# Patient Record
Sex: Female | Born: 1960 | Race: White | Hispanic: No | Marital: Married | Smoking: Never smoker
Health system: Southern US, Community
[De-identification: ages and names within clinical notes are randomized; demographics above are authoritative.]

## PROBLEM LIST (undated history)

## (undated) DIAGNOSIS — E119 Type 2 diabetes mellitus without complications: Secondary | ICD-10-CM

## (undated) DIAGNOSIS — F32A Depression, unspecified: Secondary | ICD-10-CM

## (undated) DIAGNOSIS — G2581 Restless legs syndrome: Secondary | ICD-10-CM

## (undated) DIAGNOSIS — F419 Anxiety disorder, unspecified: Secondary | ICD-10-CM

## (undated) DIAGNOSIS — E559 Vitamin D deficiency, unspecified: Secondary | ICD-10-CM

## (undated) DIAGNOSIS — E7800 Pure hypercholesterolemia, unspecified: Secondary | ICD-10-CM

## (undated) DIAGNOSIS — M722 Plantar fascial fibromatosis: Secondary | ICD-10-CM

## (undated) DIAGNOSIS — M1990 Unspecified osteoarthritis, unspecified site: Secondary | ICD-10-CM

## (undated) DIAGNOSIS — I872 Venous insufficiency (chronic) (peripheral): Secondary | ICD-10-CM

## (undated) DIAGNOSIS — Z9889 Other specified postprocedural states: Secondary | ICD-10-CM

## (undated) DIAGNOSIS — Z87442 Personal history of urinary calculi: Secondary | ICD-10-CM

## (undated) DIAGNOSIS — R112 Nausea with vomiting, unspecified: Secondary | ICD-10-CM

## (undated) HISTORY — DX: Pure hypercholesterolemia, unspecified: E78.00

## (undated) HISTORY — PX: AUGMENTATION MAMMAPLASTY: SUR837

## (undated) HISTORY — PX: PARTIAL HYSTERECTOMY: SHX80

## (undated) HISTORY — PX: PLACEMENT OF BREAST IMPLANTS: SHX6334

## (undated) HISTORY — PX: CYSTOSCOPY: SUR368

## (undated) HISTORY — PX: KIDNEY STONE SURGERY: SHX686

## (undated) HISTORY — DX: Plantar fascial fibromatosis: M72.2

## (undated) HISTORY — PX: NECK SURGERY: SHX720

## (undated) HISTORY — DX: Type 2 diabetes mellitus without complications: E11.9

## (undated) HISTORY — PX: LITHOTRIPSY: SUR834

---

## 1999-07-27 ENCOUNTER — Emergency Department (HOSPITAL_COMMUNITY): Admission: EM | Admit: 1999-07-27 | Discharge: 1999-07-27 | Payer: Self-pay | Admitting: Emergency Medicine

## 2004-09-24 ENCOUNTER — Ambulatory Visit: Payer: Self-pay | Admitting: Unknown Physician Specialty

## 2005-08-20 ENCOUNTER — Ambulatory Visit: Payer: Self-pay | Admitting: Specialist

## 2005-10-09 ENCOUNTER — Ambulatory Visit (HOSPITAL_COMMUNITY): Admission: RE | Admit: 2005-10-09 | Discharge: 2005-10-10 | Payer: Self-pay | Admitting: Neurosurgery

## 2005-10-28 ENCOUNTER — Encounter: Admission: RE | Admit: 2005-10-28 | Discharge: 2005-10-28 | Payer: Self-pay | Admitting: Neurosurgery

## 2005-12-25 ENCOUNTER — Ambulatory Visit: Payer: Self-pay | Admitting: Specialist

## 2006-02-03 ENCOUNTER — Ambulatory Visit: Payer: Self-pay | Admitting: Urology

## 2006-04-13 ENCOUNTER — Ambulatory Visit: Payer: Self-pay | Admitting: Urology

## 2006-05-22 ENCOUNTER — Emergency Department: Payer: Self-pay | Admitting: Emergency Medicine

## 2006-06-08 ENCOUNTER — Ambulatory Visit: Payer: Self-pay | Admitting: Neurosurgery

## 2006-07-29 ENCOUNTER — Ambulatory Visit: Payer: Self-pay | Admitting: Anesthesiology

## 2006-08-04 ENCOUNTER — Ambulatory Visit: Payer: Self-pay | Admitting: Unknown Physician Specialty

## 2006-08-04 ENCOUNTER — Ambulatory Visit: Payer: Self-pay | Admitting: Anesthesiology

## 2006-09-02 ENCOUNTER — Ambulatory Visit: Payer: Self-pay | Admitting: Anesthesiology

## 2006-09-22 ENCOUNTER — Ambulatory Visit: Payer: Self-pay | Admitting: Anesthesiology

## 2006-10-27 HISTORY — PX: ESOPHAGOGASTRIC FUNDOPLICATION: SHX405

## 2007-01-14 ENCOUNTER — Ambulatory Visit: Payer: Self-pay

## 2007-08-13 ENCOUNTER — Ambulatory Visit: Payer: Self-pay | Admitting: Family Medicine

## 2008-11-07 ENCOUNTER — Ambulatory Visit: Payer: Self-pay | Admitting: Obstetrics & Gynecology

## 2009-09-26 ENCOUNTER — Ambulatory Visit: Payer: Self-pay | Admitting: Urology

## 2009-10-02 ENCOUNTER — Ambulatory Visit: Payer: Self-pay | Admitting: Urology

## 2009-10-05 ENCOUNTER — Ambulatory Visit: Payer: Self-pay | Admitting: Urology

## 2009-10-18 ENCOUNTER — Ambulatory Visit: Payer: Self-pay | Admitting: Urology

## 2009-11-02 ENCOUNTER — Ambulatory Visit: Payer: Self-pay | Admitting: Urology

## 2009-11-08 ENCOUNTER — Ambulatory Visit: Payer: Self-pay | Admitting: Family Medicine

## 2009-11-08 ENCOUNTER — Ambulatory Visit: Payer: Self-pay | Admitting: Urology

## 2009-11-09 ENCOUNTER — Ambulatory Visit: Payer: Self-pay | Admitting: Urology

## 2009-11-13 ENCOUNTER — Ambulatory Visit: Payer: Self-pay | Admitting: Urology

## 2010-01-05 ENCOUNTER — Ambulatory Visit: Payer: Self-pay | Admitting: Specialist

## 2010-06-06 ENCOUNTER — Ambulatory Visit: Payer: Self-pay | Admitting: Family Medicine

## 2010-07-30 ENCOUNTER — Ambulatory Visit: Payer: Self-pay | Admitting: Family Medicine

## 2010-09-13 ENCOUNTER — Ambulatory Visit: Payer: Self-pay | Admitting: Specialist

## 2010-09-26 ENCOUNTER — Ambulatory Visit: Payer: Self-pay | Admitting: Specialist

## 2011-03-05 ENCOUNTER — Ambulatory Visit: Payer: Self-pay | Admitting: Family Medicine

## 2012-03-23 ENCOUNTER — Ambulatory Visit: Payer: Self-pay | Admitting: Family Medicine

## 2012-03-24 ENCOUNTER — Ambulatory Visit: Payer: Self-pay | Admitting: Family Medicine

## 2012-03-27 ENCOUNTER — Ambulatory Visit: Payer: Self-pay | Admitting: Family Medicine

## 2013-03-29 ENCOUNTER — Ambulatory Visit: Payer: Self-pay | Admitting: Family Medicine

## 2013-05-25 DIAGNOSIS — I10 Essential (primary) hypertension: Secondary | ICD-10-CM

## 2013-05-25 DIAGNOSIS — M722 Plantar fascial fibromatosis: Secondary | ICD-10-CM

## 2013-05-25 HISTORY — DX: Plantar fascial fibromatosis: M72.2

## 2013-05-25 HISTORY — DX: Essential (primary) hypertension: I10

## 2013-07-16 ENCOUNTER — Encounter: Payer: Self-pay | Admitting: *Deleted

## 2013-07-16 DIAGNOSIS — M722 Plantar fascial fibromatosis: Secondary | ICD-10-CM | POA: Insufficient documentation

## 2013-07-27 ENCOUNTER — Encounter: Payer: Self-pay | Admitting: Podiatry

## 2013-07-27 ENCOUNTER — Ambulatory Visit (INDEPENDENT_AMBULATORY_CARE_PROVIDER_SITE_OTHER): Payer: Managed Care, Other (non HMO) | Admitting: Podiatry

## 2013-07-27 VITALS — BP 101/64 | HR 75 | Temp 97.7°F | Resp 16 | Ht 66.0 in | Wt 195.5 lb

## 2013-07-27 DIAGNOSIS — M722 Plantar fascial fibromatosis: Secondary | ICD-10-CM

## 2013-07-27 MED ORDER — DICLOFENAC SODIUM 75 MG PO TBEC: 75.0000 mg | DELAYED_RELEASE_TABLET | Freq: Two times a day (BID) | ORAL | Status: DC

## 2013-07-27 NOTE — Patient Instructions (Addendum)
Plantar Fasciitis (Heel Spur Syndrome) with Rehab The plantar fascia is a fibrous, ligament-like, soft-tissue structure that spans the bottom of the foot. Plantar fasciitis is a condition that causes pain in the foot due to inflammation of the tissue. SYMPTOMS   Pain and tenderness on the underneath side of the foot.  Pain that worsens with standing or walking. CAUSES  Plantar fasciitis is caused by irritation and injury to the plantar fascia on the underneath side of the foot. Common mechanisms of injury include:  Direct trauma to bottom of the foot.  Damage to a small nerve that runs under the foot where the main fascia attaches to the heel bone.  Stress placed on the plantar fascia due to bone spurs. RISK INCREASES WITH:   Activities that place stress on the plantar fascia (running, jumping, pivoting, or cutting).  Poor strength and flexibility.  Improperly fitted shoes.  Tight calf muscles.  Flat feet.  Failure to warm-up properly before activity.  Obesity. PREVENTION  Warm up and stretch properly before activity.  Allow for adequate recovery between workouts.  Maintain physical fitness:  Strength, flexibility, and endurance.  Cardiovascular fitness.  Maintain a health body weight.  Avoid stress on the plantar fascia.  Wear properly fitted shoes, including arch supports for individuals who have flat feet. PROGNOSIS  If treated properly, then the symptoms of plantar fasciitis usually resolve without surgery. However, occasionally surgery is necessary. RELATED COMPLICATIONS   Recurrent symptoms that may result in a chronic condition.  Problems of the lower back that are caused by compensating for the injury, such as limping.  Pain or weakness of the foot during push-off following surgery.  Chronic inflammation, scarring, and partial or complete fascia tear, occurring more often from repeated injections. TREATMENT  Treatment initially involves the use of  ice and medication to help reduce pain and inflammation. The use of strengthening and stretching exercises may help reduce pain with activity, especially stretches of the Achilles tendon. These exercises may be performed at home or with a therapist. Your caregiver may recommend that you use heel cups of arch supports to help reduce stress on the plantar fascia. Occasionally, corticosteroid injections are given to reduce inflammation. If symptoms persist for greater than 6 months despite non-surgical (conservative), then surgery may be recommended.  MEDICATION   If pain medication is necessary, then nonsteroidal anti-inflammatory medications, such as aspirin and ibuprofen, or other minor pain relievers, such as acetaminophen, are often recommended.  Do not take pain medication within 7 days before surgery.  Prescription pain relievers may be given if deemed necessary by your caregiver. Use only as directed and only as much as you need.  Corticosteroid injections may be given by your caregiver. These injections should be reserved for the most serious cases, because they may only be given a certain number of times. HEAT AND COLD  Cold treatment (icing) relieves pain and reduces inflammation. Cold treatment should be applied for 10 to 15 minutes every 2 to 3 hours for inflammation and pain and immediately after any activity that aggravates your symptoms. Use ice packs or massage the area with a piece of ice (ice massage).  Heat treatment may be used prior to performing the stretching and strengthening activities prescribed by your caregiver, physical therapist, or athletic trainer. Use a heat pack or soak the injury in warm water. SEEK IMMEDIATE MEDICAL CARE IF:  Treatment seems to offer no benefit, or the condition worsens.  Any medications produce adverse side effects. EXERCISES RANGE   OF MOTION (ROM) AND STRETCHING EXERCISES - Plantar Fasciitis (Heel Spur Syndrome) These exercises may help you  when beginning to rehabilitate your injury. Your symptoms may resolve with or without further involvement from your physician, physical therapist or athletic trainer. While completing these exercises, remember:   Restoring tissue flexibility helps normal motion to return to the joints. This allows healthier, less painful movement and activity.  An effective stretch should be held for at least 30 seconds.  A stretch should never be painful. You should only feel a gentle lengthening or release in the stretched tissue. RANGE OF MOTION - Toe Extension, Flexion  Sit with your right / left leg crossed over your opposite knee.  Grasp your toes and gently pull them back toward the top of your foot. You should feel a stretch on the bottom of your toes and/or foot.  Hold this stretch for __________ seconds.  Now, gently pull your toes toward the bottom of your foot. You should feel a stretch on the top of your toes and or foot.  Hold this stretch for __________ seconds. Repeat __________ times. Complete this stretch __________ times per day.  RANGE OF MOTION - Ankle Dorsiflexion, Active Assisted  Remove shoes and sit on a chair that is preferably not on a carpeted surface.  Place right / left foot under knee. Extend your opposite leg for support.  Keeping your heel down, slide your right / left foot back toward the chair until you feel a stretch at your ankle or calf. If you do not feel a stretch, slide your bottom forward to the edge of the chair, while still keeping your heel down.  Hold this stretch for __________ seconds. Repeat __________ times. Complete this stretch __________ times per day.  STRETCH  Gastroc, Standing  Place hands on wall.  Extend right / left leg, keeping the front knee somewhat bent.  Slightly point your toes inward on your back foot.  Keeping your right / left heel on the floor and your knee straight, shift your weight toward the wall, not allowing your back to  arch.  You should feel a gentle stretch in the right / left calf. Hold this position for __________ seconds. Repeat __________ times. Complete this stretch __________ times per day. STRETCH  Soleus, Standing  Place hands on wall.  Extend right / left leg, keeping the other knee somewhat bent.  Slightly point your toes inward on your back foot.  Keep your right / left heel on the floor, bend your back knee, and slightly shift your weight over the back leg so that you feel a gentle stretch deep in your back calf.  Hold this position for __________ seconds. Repeat __________ times. Complete this stretch __________ times per day. STRETCH  Gastrocsoleus, Standing  Note: This exercise can place a lot of stress on your foot and ankle. Please complete this exercise only if specifically instructed by your caregiver.   Place the ball of your right / left foot on a step, keeping your other foot firmly on the same step.  Hold on to the wall or a rail for balance.  Slowly lift your other foot, allowing your body weight to press your heel down over the edge of the step.  You should feel a stretch in your right / left calf.  Hold this position for __________ seconds.  Repeat this exercise with a slight bend in your right / left knee. Repeat __________ times. Complete this stretch __________ times per day.    STRENGTHENING EXERCISES - Plantar Fasciitis (Heel Spur Syndrome)  These exercises may help you when beginning to rehabilitate your injury. They may resolve your symptoms with or without further involvement from your physician, physical therapist or athletic trainer. While completing these exercises, remember:   Muscles can gain both the endurance and the strength needed for everyday activities through controlled exercises.  Complete these exercises as instructed by your physician, physical therapist or athletic trainer. Progress the resistance and repetitions only as guided. STRENGTH - Towel  Curls  Sit in a chair positioned on a non-carpeted surface.  Place your foot on a towel, keeping your heel on the floor.  Pull the towel toward your heel by only curling your toes. Keep your heel on the floor.  If instructed by your physician, physical therapist or athletic trainer, add ____________________ at the end of the towel. Repeat __________ times. Complete this exercise __________ times per day. STRENGTH - Ankle Inversion  Secure one end of a rubber exercise band/tubing to a fixed object (table, pole). Loop the other end around your foot just before your toes.  Place your fists between your knees. This will focus your strengthening at your ankle.  Slowly, pull your big toe up and in, making sure the band/tubing is positioned to resist the entire motion.  Hold this position for __________ seconds.  Have your muscles resist the band/tubing as it slowly pulls your foot back to the starting position. Repeat __________ times. Complete this exercises __________ times per day.  Document Released: 10/13/2005 Document Revised: 01/05/2012 Document Reviewed: 01/25/2009 [employer] Patient Information 2014 [employer]. Plantar Fasciitis Plantar fasciitis is a common condition that causes foot pain. It is soreness (inflammation) of the band of tough fibrous tissue on the bottom of the foot that runs from the heel bone (calcaneus) to the ball of the foot. The cause of this soreness may be from excessive standing, poor fitting shoes, running on hard surfaces, being overweight, having an abnormal walk, or overuse (this is common in runners) of the painful foot or feet. It is also common in aerobic exercise dancers and ballet dancers. SYMPTOMS  Most people with plantar fasciitis complain of:  Severe pain in the morning on the bottom of their foot especially when taking the first steps out of bed. This pain recedes after a few minutes of walking.  Severe pain is experienced also during walking  following a long period of inactivity.  Pain is worse when walking barefoot or up stairs DIAGNOSIS   Your caregiver will diagnose this condition by examining and feeling your foot.  Special tests such as X-rays of your foot, are usually not needed. PREVENTION   Consult a sports medicine professional before beginning a new exercise program.  Walking programs offer a good workout. With walking there is a lower chance of overuse injuries common to runners. There is less impact and less jarring of the joints.  Begin all new exercise programs slowly. If problems or pain develop, decrease the amount of time or distance until you are at a comfortable level.  Wear good shoes and replace them regularly.  Stretch your foot and the heel cords at the back of the ankle (Achilles tendon) both before and after exercise.  Run or exercise on even surfaces that are not hard. For example, asphalt is better than pavement.  Do not run barefoot on hard surfaces.  If using a treadmill, vary the incline.  Do not continue to workout if you have foot or joint   problems. Seek professional help if they do not improve. HOME CARE INSTRUCTIONS   Avoid activities that cause you pain until you recover.  Use ice or cold packs on the problem or painful areas after working out.  Only take over-the-counter or prescription medicines for pain, discomfort, or fever as directed by your caregiver.  Soft shoe inserts or athletic shoes with air or gel sole cushions may be helpful.  If problems continue or become more severe, consult a sports medicine caregiver or your own health care provider. Cortisone is a potent anti-inflammatory medication that may be injected into the painful area. You can discuss this treatment with your caregiver. MAKE SURE YOU:   Understand these instructions.  Will watch your condition.  Will get help right away if you are not doing well or get worse. Document Released: 07/08/2001 Document  Revised: 01/05/2012 Document Reviewed: 09/06/2008 [employer] Patient Information 2014 [employer].  

## 2013-07-27 NOTE — Progress Notes (Signed)
Andrea Mcdonald presents today for followup of her plantar fasciitis bilaterally. She states that she is wearing her night splint she is icing she is doing everything she can. If it is not getting better at this point. Physical exam today reveals palpable pulses no calf pain. She has pain on palpation of the medial calcaneal tubercles.  Assessment: Plantar fasciitis bilaterally  Plan: Bilateral plantar fascial injections today. Continue all conservative therapies and will followup with her in one month.

## 2013-09-01 ENCOUNTER — Ambulatory Visit (INDEPENDENT_AMBULATORY_CARE_PROVIDER_SITE_OTHER): Payer: Managed Care, Other (non HMO) | Admitting: Podiatry

## 2013-09-01 ENCOUNTER — Encounter: Payer: Self-pay | Admitting: Podiatry

## 2013-09-01 VITALS — BP 91/54 | HR 77 | Resp 16 | Ht 66.0 in | Wt 190.0 lb

## 2013-09-01 DIAGNOSIS — M722 Plantar fascial fibromatosis: Secondary | ICD-10-CM

## 2013-09-01 NOTE — Progress Notes (Signed)
Andrea Mcdonald presents today complaining of bilateral painful heels. She states that they're discuss sore with her standing. She says that her shoes are wearing out too quickly. She states that the last medicine that week she took developed an ulcer.  Objective: Pulses remain palpable bilateral. Pain in limb secondary to plantar fasciitis bilateral. Palpation medial calcaneal tubercles bilateral.  Assessment: Plantar fasciitis chronic in nature bilateral.  Plan: Injected bilateral heels today discussed the necessity of new shoes and discuss the need for orthotics.

## 2013-09-12 ENCOUNTER — Telehealth: Payer: Self-pay | Admitting: *Deleted

## 2013-09-12 NOTE — Telephone Encounter (Signed)
PT CALLED WANTING TO CXL SURGERY [facility] ON 12.12.14 WITH DR. HYATT. SAID SHE IS GOING TO WAIT AND WILL KEEP HER 12.3.14 APPT. CALLED SURGERY CENTER AND CXL WITH CYNTHIA.

## 2013-09-28 ENCOUNTER — Encounter: Payer: Self-pay | Admitting: Podiatry

## 2013-09-28 ENCOUNTER — Ambulatory Visit (INDEPENDENT_AMBULATORY_CARE_PROVIDER_SITE_OTHER): Payer: Managed Care, Other (non HMO) | Admitting: Podiatry

## 2013-09-28 VITALS — BP 101/77 | HR 77 | Resp 16

## 2013-09-28 DIAGNOSIS — M722 Plantar fascial fibromatosis: Secondary | ICD-10-CM

## 2013-09-28 NOTE — Progress Notes (Signed)
Andrea Mcdonald presents today for followup of her plantar fasciitis. She states that she was very much considering surgical correction. However she decided to take our advice and purchased a new pair of tennis shoes with inserts. She states that this point she feels approximately 80% better. She would like to consider purchasing orthotics in January.  Objective: Pulses are strongly palpable bilateral no calf pain. She has no pain on palpation medial continued tubercles bilateral.  Assessment: Well-healing plantar fasciitis bilateral.  Plan: Continue use of all conservative therapies at this point and I will followup with her in January.

## 2013-09-29 ENCOUNTER — Ambulatory Visit: Payer: Managed Care, Other (non HMO) | Admitting: Podiatry

## 2013-11-14 ENCOUNTER — Ambulatory Visit: Payer: Managed Care, Other (non HMO) | Admitting: Podiatry

## 2013-12-28 ENCOUNTER — Ambulatory Visit (INDEPENDENT_AMBULATORY_CARE_PROVIDER_SITE_OTHER): Payer: Managed Care, Other (non HMO) | Admitting: Podiatry

## 2013-12-28 DIAGNOSIS — M722 Plantar fascial fibromatosis: Secondary | ICD-10-CM

## 2013-12-28 NOTE — Progress Notes (Signed)
She presented today for orthotic scan. We will notify her once her orthotics come in. Her current diagnosis his plantar fasciitis.

## 2014-01-19 ENCOUNTER — Ambulatory Visit (INDEPENDENT_AMBULATORY_CARE_PROVIDER_SITE_OTHER): Payer: Managed Care, Other (non HMO) | Admitting: Podiatry

## 2014-01-19 DIAGNOSIS — M722 Plantar fascial fibromatosis: Secondary | ICD-10-CM

## 2014-01-19 NOTE — Patient Instructions (Signed)

## 2014-01-19 NOTE — Progress Notes (Signed)
She for orthotics she was given both instructions for the use and care of them I will followup with her one month for evaluation.

## 2014-02-16 ENCOUNTER — Ambulatory Visit: Payer: Managed Care, Other (non HMO) | Admitting: Podiatry

## 2014-03-06 ENCOUNTER — Encounter: Payer: Self-pay | Admitting: Podiatry

## 2014-03-06 ENCOUNTER — Ambulatory Visit (INDEPENDENT_AMBULATORY_CARE_PROVIDER_SITE_OTHER): Payer: Managed Care, Other (non HMO) | Admitting: Podiatry

## 2014-03-06 VITALS — BP 112/82 | HR 88 | Resp 16

## 2014-03-06 DIAGNOSIS — M722 Plantar fascial fibromatosis: Secondary | ICD-10-CM

## 2014-03-06 NOTE — Progress Notes (Signed)
She presents today wearing her orthotics. She never followed up with me for her orthotic evaluation. She states that her heels are hurting terribly she can hardly walk.  Objective: Vital signs are stable she is alert and oriented x3. She has pain on palpation medial continued tubercles bilateral. Pulses are palpable bilateral and no calf pain bilateral.  Assessment: Plantar fasciitis bilateral.  Plan:

## 2014-05-24 ENCOUNTER — Encounter: Payer: Self-pay | Admitting: Podiatry

## 2014-05-24 ENCOUNTER — Ambulatory Visit (INDEPENDENT_AMBULATORY_CARE_PROVIDER_SITE_OTHER): Payer: Managed Care, Other (non HMO) | Admitting: Podiatry

## 2014-05-24 DIAGNOSIS — M7750 Other enthesopathy of unspecified foot: Secondary | ICD-10-CM

## 2014-05-24 DIAGNOSIS — M779 Enthesopathy, unspecified: Principal | ICD-10-CM

## 2014-05-24 DIAGNOSIS — M778 Other enthesopathies, not elsewhere classified: Secondary | ICD-10-CM

## 2014-05-25 NOTE — Progress Notes (Signed)
He presents today complaining of a painful hallux interphalangeal joint secondary to new shoe gear at work. She states that her blood sugars under control a 6.8 hemoglobin A1c. She's concerned about ulceration.  Objective: Pulses are strongly palpable. Neurologic sensorium is intact. Mild erythema and mild tenderness on range of motion at the IP joint particularly on the medial aspect of the IP joint hallux bilaterally. Radiographic evaluation demonstrates normal osseous architecture. She does have some mild extensors at the IP joint.  Assessment: Hallux interphalangeal with capsulitis hallux bilateral.  Plan: Injected the area today with dexamethasone 2 mg to the point of maximal tenderness. I will followup with her on an as-needed basis.

## 2014-09-19 ENCOUNTER — Ambulatory Visit: Payer: Self-pay | Admitting: Family Medicine

## 2014-10-02 ENCOUNTER — Ambulatory Visit (INDEPENDENT_AMBULATORY_CARE_PROVIDER_SITE_OTHER): Payer: Managed Care, Other (non HMO) | Admitting: Podiatry

## 2014-10-02 ENCOUNTER — Ambulatory Visit: Payer: Self-pay

## 2014-10-02 ENCOUNTER — Ambulatory Visit (INDEPENDENT_AMBULATORY_CARE_PROVIDER_SITE_OTHER): Payer: Managed Care, Other (non HMO)

## 2014-10-02 VITALS — BP 91/60 | HR 98 | Resp 16

## 2014-10-02 DIAGNOSIS — M722 Plantar fascial fibromatosis: Secondary | ICD-10-CM

## 2014-10-02 NOTE — Progress Notes (Signed)
She presents today stating that both of her heels are bothering her again.  Objective evaluation reveals 5 signs are stable she's alert and oriented 3 she has pain on palpation medially located tubercles bilateral heels.  Assessment: Pain in limb secondary to plantar fasciitis bilateral.  Plan: Injected the bilateral heels today with Kenalog and local anesthetic. Continue all conservative therapies.

## 2014-10-29 ENCOUNTER — Emergency Department: Payer: Self-pay | Admitting: Emergency Medicine

## 2014-10-29 LAB — URINALYSIS, COMPLETE
Bacteria: NONE SEEN
Bilirubin,UR: NEGATIVE
GLUCOSE, UR: NEGATIVE mg/dL (ref 0–75)
KETONE: NEGATIVE
LEUKOCYTE ESTERASE: NEGATIVE
NITRITE: NEGATIVE
Ph: 5 (ref 4.5–8.0)
RBC,UR: 547 /HPF (ref 0–5)
SPECIFIC GRAVITY: 1.024 (ref 1.003–1.030)
Squamous Epithelial: 1

## 2014-10-29 LAB — CBC
HCT: 40.9 % (ref 35.0–47.0)
HGB: 13.7 g/dL (ref 12.0–16.0)
MCH: 29.4 pg (ref 26.0–34.0)
MCHC: 33.4 g/dL (ref 32.0–36.0)
MCV: 88 fL (ref 80–100)
PLATELETS: 212 10*3/uL (ref 150–440)
RBC: 4.65 10*6/uL (ref 3.80–5.20)
RDW: 13.6 % (ref 11.5–14.5)
WBC: 10.8 10*3/uL (ref 3.6–11.0)

## 2014-10-29 LAB — COMPREHENSIVE METABOLIC PANEL
ANION GAP: 5 — AB (ref 7–16)
AST: 20 U/L (ref 15–37)
Albumin: 4.1 g/dL (ref 3.4–5.0)
Alkaline Phosphatase: 72 U/L
BUN: 16 mg/dL (ref 7–18)
Bilirubin,Total: 0.7 mg/dL (ref 0.2–1.0)
CALCIUM: 9.7 mg/dL (ref 8.5–10.1)
CO2: 32 mmol/L (ref 21–32)
Chloride: 102 mmol/L (ref 98–107)
Creatinine: 0.98 mg/dL (ref 0.60–1.30)
GLUCOSE: 169 mg/dL — AB (ref 65–99)
OSMOLALITY: 283 (ref 275–301)
POTASSIUM: 4.5 mmol/L (ref 3.5–5.1)
SGPT (ALT): 27 U/L
SODIUM: 139 mmol/L (ref 136–145)
Total Protein: 7.6 g/dL (ref 6.4–8.2)

## 2015-04-09 ENCOUNTER — Encounter: Payer: Self-pay | Admitting: Podiatry

## 2015-04-09 ENCOUNTER — Ambulatory Visit (INDEPENDENT_AMBULATORY_CARE_PROVIDER_SITE_OTHER): Payer: Managed Care, Other (non HMO) | Admitting: Podiatry

## 2015-04-09 VITALS — BP 100/52 | HR 86 | Resp 16

## 2015-04-09 DIAGNOSIS — M722 Plantar fascial fibromatosis: Secondary | ICD-10-CM

## 2015-04-09 MED ORDER — CELECOXIB 100 MG PO CAPS: 100.0000 mg | ORAL_CAPSULE | Freq: Two times a day (BID) | ORAL | Status: DC

## 2015-04-09 NOTE — Progress Notes (Signed)
She presents today with chief complaint of bilateral heel pain.  Objective: Vital signs are stable she is alert and oriented 3. She has recurrence of plantar fasciitis bilateral. Right greater than left. She has pain on palpation lateral aspect of the right heel medial aspect of the left heel.  Assessment: Plantar fascitis lateral right medial left.  Plan: Injected the bilateral heels today with Kenalog and local anesthesia to. Started her on Celebrex 100 mg 1 by mouth twice a day she will continue to ice where her plantar fascial braces and night splint. Discussed the needs for orthotics and will follow up with her in 1 month. She will bring her old orthotics with her next visit

## 2015-05-14 ENCOUNTER — Ambulatory Visit: Payer: Managed Care, Other (non HMO) | Admitting: Podiatry

## 2015-06-07 ENCOUNTER — Encounter (INDEPENDENT_AMBULATORY_CARE_PROVIDER_SITE_OTHER): Payer: Self-pay

## 2015-06-07 ENCOUNTER — Encounter: Payer: Self-pay | Admitting: Family Medicine

## 2015-06-07 ENCOUNTER — Ambulatory Visit (INDEPENDENT_AMBULATORY_CARE_PROVIDER_SITE_OTHER): Payer: Managed Care, Other (non HMO) | Admitting: Family Medicine

## 2015-06-07 VITALS — BP 102/58 | HR 105 | Temp 98.6°F | Resp 16 | Ht 65.0 in | Wt 190.2 lb

## 2015-06-07 DIAGNOSIS — IMO0002 Reserved for concepts with insufficient information to code with codable children: Secondary | ICD-10-CM

## 2015-06-07 DIAGNOSIS — I10 Essential (primary) hypertension: Secondary | ICD-10-CM | POA: Diagnosis not present

## 2015-06-07 DIAGNOSIS — F329 Major depressive disorder, single episode, unspecified: Secondary | ICD-10-CM

## 2015-06-07 DIAGNOSIS — E1165 Type 2 diabetes mellitus with hyperglycemia: Secondary | ICD-10-CM

## 2015-06-07 DIAGNOSIS — E785 Hyperlipidemia, unspecified: Secondary | ICD-10-CM

## 2015-06-07 DIAGNOSIS — F418 Other specified anxiety disorders: Secondary | ICD-10-CM | POA: Insufficient documentation

## 2015-06-07 DIAGNOSIS — E1169 Type 2 diabetes mellitus with other specified complication: Secondary | ICD-10-CM | POA: Insufficient documentation

## 2015-06-07 DIAGNOSIS — Z23 Encounter for immunization: Secondary | ICD-10-CM | POA: Diagnosis not present

## 2015-06-07 DIAGNOSIS — F32A Depression, unspecified: Secondary | ICD-10-CM

## 2015-06-07 DIAGNOSIS — F419 Anxiety disorder, unspecified: Secondary | ICD-10-CM | POA: Insufficient documentation

## 2015-06-07 LAB — GLUCOSE, POCT (MANUAL RESULT ENTRY): POC Glucose: 250 mg/dl — AB (ref 70–99)

## 2015-06-07 LAB — POCT GLYCOSYLATED HEMOGLOBIN (HGB A1C): HEMOGLOBIN A1C: 7.3

## 2015-06-07 NOTE — Progress Notes (Signed)
Name: Andrea Mcdonald   MRN: [id number]    DOBWell-controlle: August 31, 1961   Date:06/07/2015       Progress Note  Subjective  Chief Complaint  Chief Complaint  Patient presents with  . Diabetes    pt here for 3 month f/u DM,HTN,lipids  . Hypertension  . Hyperlipidemia    Diabetes She presents for her follow-up diabetic visit. She has type 2 diabetes mellitus. Her disease course has been improving. Hypoglycemia symptoms include nervousness/anxiousness. Pertinent negatives for hypoglycemia include no dizziness, headaches, seizures or tremors. Associated symptoms include chest pain and fatigue. Pertinent negatives for diabetes include no blurred vision, no weakness and no weight loss. Symptoms are stable. There are no diabetic complications. Risk factors for coronary artery disease include diabetes mellitus, dyslipidemia, hypertension, obesity, sedentary lifestyle and post-menopausal. Current diabetic treatment includes oral agent (dual therapy). She is compliant with treatment most of the time. Her weight is increasing steadily. She is following a diabetic diet. Her overall blood glucose range is 140-180 mg/dl. An ACE inhibitor/angiotensin II receptor blocker is being taken.  Hypertension This is a chronic problem. The current episode started more than 1 year ago. The problem has been rapidly improving since onset. The problem is controlled. Associated symptoms include anxiety and chest pain. Pertinent negatives include no blurred vision, headaches, neck pain, orthopnea, palpitations or shortness of breath. There are no associated agents to hypertension. Risk factors for coronary artery disease include diabetes mellitus, dyslipidemia, obesity, post-menopausal state, sedentary lifestyle and stress. Past treatments include ACE inhibitors and diuretics. The current treatment provides moderate improvement. There are no compliance problems.   Hyperlipidemia This is a chronic problem. The current episode  started more than 1 year ago. The problem is controlled. Recent lipid tests were reviewed and are normal. Exacerbating diseases include obesity. Factors aggravating her hyperlipidemia include fatty foods. Associated symptoms include chest pain. Pertinent negatives include no focal weakness, myalgias or shortness of breath. Current antihyperlipidemic treatment includes statins. The current treatment provides moderate improvement of lipids. There are no compliance problems.   Depression        This is a chronic problem.  The current episode started more than 1 year ago.   The onset quality is gradual.   Associated symptoms include fatigue and restlessness.  Associated symptoms include does not have insomnia, no myalgias and no headaches.     The symptoms are aggravated by work stress and family issues.  Compliance with treatment is good.  Risk factors include major life event, marital problems and menopause.   Past medical history includes anxiety.       Past Medical History  Diagnosis Date  . Diabetes mellitus without complication   . Hypertension 05/25/2013  . High cholesterol   . Plantar fasciitis, bilateral 05/25/2013    Social History  Substance Use Topics  . Smoking status: Never Smoker   . Smokeless tobacco: Never Used  . Alcohol Use: Yes     Comment: drink occ     Current outpatient prescriptions:  .  glipiZIDE (GLUCOTROL XL) 5 MG 24 hr tablet, Take 5 mg by mouth daily., Disp: , Rfl:  .  lisinopril (PRINIVIL,ZESTRIL) 10 MG tablet, Take 10 mg by mouth daily., Disp: , Rfl:  .  metFORMIN (GLUCOPHAGE) 1000 MG tablet, Take 1,000 mg by mouth 2 (two) times daily with a meal., Disp: , Rfl:  .  rosuvastatin (CRESTOR) 20 MG tablet, Take 20 mg by mouth daily., Disp: , Rfl:  .  sertraline (ZOLOFT) 100 MG  tablet, , Disp: , Rfl:  .  triamterene-hydrochlorothiazide (DYAZIDE) 37.5-25 MG per capsule, Take 1 capsule by mouth every morning., Disp: , Rfl:   Allergies  Allergen Reactions  .  Codeine Itching    Review of Systems  Constitutional: Positive for fatigue. Negative for fever, chills and weight loss.  HENT: Negative for congestion, hearing loss, sore throat and tinnitus.   Eyes: Negative for blurred vision, double vision and redness.  Respiratory: Negative for cough, hemoptysis and shortness of breath.   Cardiovascular: Positive for chest pain. Negative for palpitations, orthopnea, claudication and leg swelling.  Gastrointestinal: Negative for heartburn, nausea, vomiting, diarrhea, constipation and blood in stool.  Genitourinary: Negative for dysuria, urgency, frequency and hematuria.  Musculoskeletal: Negative for myalgias, back pain, joint pain, falls and neck pain.  Skin: Negative for itching.  Neurological: Negative for dizziness, tingling, tremors, focal weakness, seizures, loss of consciousness, weakness and headaches.  Endo/Heme/Allergies: Does not bruise/bleed easily.  Psychiatric/Behavioral: Positive for depression. Negative for substance abuse. The patient is nervous/anxious. The patient does not have insomnia.      Objective  Filed Vitals:   06/07/15 1526  BP: 102/58  Pulse: 105  Temp: 98.6 F (37 C)  Resp: 16  Height: 5\' 5"  (1.651 m)  Weight: 190 lb 3 oz (86.268 kg)  SpO2: 96%     Physical Exam  Constitutional: She is oriented to person, place, and time and well-developed, well-nourished, and in no distress.  Modestly obese  HENT:  Head: Normocephalic.  Eyes: EOM are normal. Pupils are equal, round, and reactive to light.  Neck: Normal range of motion. No thyromegaly present.  Cardiovascular: Normal rate, regular rhythm and normal heart sounds.   No murmur heard. Pulmonary/Chest: Effort normal and breath sounds normal.  Abdominal: Soft. Bowel sounds are normal.  Musculoskeletal: Normal range of motion. She exhibits no edema.  Neurological: She is alert and oriented to person, place, and time. No cranial nerve deficit. Gait normal.   Skin: Skin is warm and dry. No rash noted.  Psychiatric: Memory and affect normal.      Assessment & Plan  1. Uncontrolled diabetes mellitusemphasized need for dietary compliance as well as medication compliance - POCT Glucose (CBG) - POCT HgB A1C  2. Hyperlipidemia  - Lipid Profile  3. Essential hypertension  - Comprehensive Metabolic Panel (CMET) - TSH  4. Depression   5. Flu vaccine need  - Flu Vaccine QUAD 36+ mos PF IM (Fluarix Quad PF)

## 2015-06-08 ENCOUNTER — Encounter: Payer: Self-pay | Admitting: Family Medicine

## 2015-06-08 DIAGNOSIS — Z23 Encounter for immunization: Secondary | ICD-10-CM

## 2015-06-25 ENCOUNTER — Other Ambulatory Visit: Payer: Self-pay | Admitting: Family Medicine

## 2015-06-25 ENCOUNTER — Other Ambulatory Visit: Payer: Self-pay | Admitting: Emergency Medicine

## 2015-06-25 MED ORDER — OMEPRAZOLE 20 MG PO CPDR: 20.0000 mg | DELAYED_RELEASE_CAPSULE | Freq: Every day | ORAL | Status: DC

## 2015-06-25 NOTE — Telephone Encounter (Signed)
Script sent  

## 2015-07-07 LAB — COMPREHENSIVE METABOLIC PANEL
A/G RATIO: 1.8 (ref 1.1–2.5)
ALT: 18 IU/L (ref 0–32)
AST: 14 IU/L (ref 0–40)
Albumin: 4.4 g/dL (ref 3.5–5.5)
Alkaline Phosphatase: 65 IU/L (ref 39–117)
BUN/Creatinine Ratio: 15 (ref 9–23)
BUN: 13 mg/dL (ref 6–24)
Bilirubin Total: 0.9 mg/dL (ref 0.0–1.2)
CALCIUM: 9.8 mg/dL (ref 8.7–10.2)
CO2: 26 mmol/L (ref 18–29)
Chloride: 101 mmol/L (ref 97–108)
Creatinine, Ser: 0.85 mg/dL (ref 0.57–1.00)
GFR, EST AFRICAN AMERICAN: 90 mL/min/{1.73_m2} (ref >59)
GFR, EST NON AFRICAN AMERICAN: 78 mL/min/{1.73_m2} (ref >59)
GLUCOSE: 189 mg/dL — AB (ref 65–99)
Globulin, Total: 2.4 g/dL (ref 1.5–4.5)
Potassium: 4.5 mmol/L (ref 3.5–5.2)
Sodium: 141 mmol/L (ref 134–144)
TOTAL PROTEIN: 6.8 g/dL (ref 6.0–8.5)

## 2015-07-07 LAB — LIPID PANEL
CHOL/HDL RATIO: 3 ratio (ref 0.0–4.4)
Cholesterol, Total: 154 mg/dL (ref 100–199)
HDL: 52 mg/dL (ref >39)
LDL CALC: 67 mg/dL (ref 0–99)
Triglycerides: 175 mg/dL — ABNORMAL HIGH (ref 0–149)
VLDL CHOLESTEROL CAL: 35 mg/dL (ref 5–40)

## 2015-07-07 LAB — TSH: TSH: 2.03 u[IU]/mL (ref 0.450–4.500)

## 2015-07-09 ENCOUNTER — Telehealth: Payer: Self-pay | Admitting: Family Medicine

## 2015-07-19 NOTE — Telephone Encounter (Signed)
No additional needed ?

## 2015-08-13 ENCOUNTER — Other Ambulatory Visit: Payer: Self-pay | Admitting: Family Medicine

## 2015-09-03 ENCOUNTER — Telehealth: Payer: Self-pay | Admitting: Family Medicine

## 2015-09-03 NOTE — Telephone Encounter (Signed)
Patient is going on a cruise this weekend and is requesting something for motion to be sent to [facility].

## 2015-09-04 NOTE — Telephone Encounter (Signed)
:   Transdermal scopolamine applied behind the ear 4 hours before embarking on cruise and change every 3 days. Give sufficient number for the duration of the cruise with this regimen

## 2015-09-05 MED ORDER — SCOPOLAMINE 1 MG/3DAYS TD PT72: 1.0000 | MEDICATED_PATCH | TRANSDERMAL | Status: DC

## 2015-09-05 NOTE — Telephone Encounter (Signed)
Patches have been sent to her pharmacy,[facility] on [address].

## 2015-09-05 NOTE — Telephone Encounter (Signed)
Patient informed through voicemail.

## 2015-10-01 ENCOUNTER — Other Ambulatory Visit: Payer: Self-pay | Admitting: Family Medicine

## 2015-10-01 NOTE — Telephone Encounter (Signed)
I am forwarding this encounter to the designated PCP and/or their nursing staff for further management of the tasks requested. Thank you.  

## 2015-10-09 ENCOUNTER — Ambulatory Visit: Payer: Managed Care, Other (non HMO) | Admitting: Family Medicine

## 2015-10-18 ENCOUNTER — Ambulatory Visit: Payer: Managed Care, Other (non HMO) | Admitting: Family Medicine

## 2015-10-22 ENCOUNTER — Emergency Department
Admission: EM | Admit: 2015-10-22 | Discharge: 2015-10-22 | Disposition: A | Discharge status: Home / Self Care | Payer: Managed Care, Other (non HMO) | Attending: Emergency Medicine | Admitting: Emergency Medicine

## 2015-10-22 ENCOUNTER — Encounter: Payer: Self-pay | Admitting: Emergency Medicine

## 2015-10-22 DIAGNOSIS — I10 Essential (primary) hypertension: Secondary | ICD-10-CM | POA: Diagnosis not present

## 2015-10-22 DIAGNOSIS — E1165 Type 2 diabetes mellitus with hyperglycemia: Secondary | ICD-10-CM | POA: Diagnosis present

## 2015-10-22 DIAGNOSIS — Z794 Long term (current) use of insulin: Secondary | ICD-10-CM | POA: Insufficient documentation

## 2015-10-22 DIAGNOSIS — Z7982 Long term (current) use of aspirin: Secondary | ICD-10-CM | POA: Diagnosis not present

## 2015-10-22 DIAGNOSIS — Z7984 Long term (current) use of oral hypoglycemic drugs: Secondary | ICD-10-CM | POA: Diagnosis not present

## 2015-10-22 DIAGNOSIS — R739 Hyperglycemia, unspecified: Secondary | ICD-10-CM

## 2015-10-22 DIAGNOSIS — Z79899 Other long term (current) drug therapy: Secondary | ICD-10-CM | POA: Diagnosis not present

## 2015-10-22 LAB — URINALYSIS COMPLETE WITH MICROSCOPIC (ARMC ONLY)
Bacteria, UA: NONE SEEN
Bilirubin Urine: NEGATIVE
Glucose, UA: >500 mg/dL — AB
Hgb urine dipstick: NEGATIVE
KETONES UR: NEGATIVE mg/dL
Leukocytes, UA: NEGATIVE
NITRITE: NEGATIVE
PH: 5 (ref 5.0–8.0)
PROTEIN: NEGATIVE mg/dL
RBC / HPF: NONE SEEN RBC/hpf (ref 0–5)
Specific Gravity, Urine: 1.028 (ref 1.005–1.030)

## 2015-10-22 LAB — BASIC METABOLIC PANEL
ANION GAP: 9 (ref 5–15)
BUN: 14 mg/dL (ref 6–20)
CALCIUM: 10.3 mg/dL (ref 8.9–10.3)
CO2: 29 mmol/L (ref 22–32)
Chloride: 99 mmol/L — ABNORMAL LOW (ref 101–111)
Creatinine, Ser: 0.81 mg/dL (ref 0.44–1.00)
Glucose, Bld: 330 mg/dL — ABNORMAL HIGH (ref 65–99)
Potassium: 3.8 mmol/L (ref 3.5–5.1)
Sodium: 137 mmol/L (ref 135–145)

## 2015-10-22 LAB — CBC
HCT: 41.6 % (ref 35.0–47.0)
HEMOGLOBIN: 14.2 g/dL (ref 12.0–16.0)
MCH: 29 pg (ref 26.0–34.0)
MCHC: 34.2 g/dL (ref 32.0–36.0)
MCV: 84.8 fL (ref 80.0–100.0)
Platelets: 189 10*3/uL (ref 150–440)
RBC: 4.91 MIL/uL (ref 3.80–5.20)
RDW: 13.4 % (ref 11.5–14.5)
WBC: 7.7 10*3/uL (ref 3.6–11.0)

## 2015-10-22 LAB — GLUCOSE, CAPILLARY: Glucose-Capillary: 301 mg/dL — ABNORMAL HIGH (ref 65–99)

## 2015-10-22 MED ORDER — SODIUM CHLORIDE 0.9 % IV BOLUS (SEPSIS)
1000.0000 mL | INTRAVENOUS | Status: AC
  Administered 2015-10-22: 1000 mL via INTRAVENOUS

## 2015-10-22 NOTE — ED Provider Notes (Addendum)
[facility] Emergency Department Provider Note  ____________________________________________  Time seen: Approximately 5:06 PM  I have reviewed the triage vital signs and the nursing notes.   HISTORY  Chief Complaint Hyperglycemia    HPI Andrea Mcdonald is a 54 y.o. female with a history that includes diabetes on metformin 1000 mg twice daily and glipizide 10 mg daily who presents with elevated blood sugar and associated symptoms of polyuria and some blurry vision.  She states that this is been gradual in onset over about the last week.  She has tested her blood sugar at home and it has been as high as 588.  When that happened she used an old insulin pen that she had from "a long time ago" when she used insulin.  She stated that her blood sugar dropped too low when she used it but today it is back up in the 400s.  She came to the emergency department for further evaluation.  She endorses increased frequency of urination and increased thirst as well as blurry vision and describes her symptoms as moderate.  She has no other symptoms specifically including fever/chills, chest pain, shortness of breath, abdominal pain, dysuria, nausea/vomiting.  Past Medical History  Diagnosis Date  . Diabetes mellitus without complication ([place])   . Hypertension 05/25/2013  . High cholesterol   . Plantar fasciitis, bilateral 05/25/2013    Patient Active Problem List   Diagnosis Date Noted  . Flu vaccine need 06/08/2015  . Uncontrolled diabetes mellitus ([place]) 06/07/2015  . Hyperlipidemia 06/07/2015  . Essential hypertension 06/07/2015  . Depression 06/07/2015  . Plantar fasciitis, bilateral     Past Surgical History  Procedure Laterality Date  . Partial hysterectomy    . Neck surgery      x2  . Placement of breast implants    . Lithotripsy    . Kidney stone surgery      Current Outpatient Rx  Name  Route  Sig  Dispense  Refill  . aspirin 81 MG chewable tablet    Oral   Chew 81 mg by mouth at bedtime.         Marland Kitchen glipiZIDE (GLUCOTROL XL) 10 MG 24 hr tablet   Oral   Take 10 mg by mouth at bedtime.          Marland Kitchen lisinopril (PRINIVIL,ZESTRIL) 10 MG tablet   Oral   Take 10 mg by mouth at bedtime.          . metFORMIN (GLUCOPHAGE) 1000 MG tablet   Oral   Take 1,000 mg by mouth at bedtime.          Marland Kitchen omeprazole (PRILOSEC) 20 MG capsule   Oral   Take 1 capsule (20 mg total) by mouth daily. Patient taking differently: Take 20 mg by mouth daily as needed (for indigestion/heartburn).    30 capsule   3   . rosuvastatin (CRESTOR) 20 MG tablet   Oral   Take 20 mg by mouth at bedtime.         . sertraline (ZOLOFT) 100 MG tablet   Oral   Take 100 mg by mouth at bedtime.          . triamterene-hydrochlorothiazide (MAXZIDE-25) 37.5-25 MG tablet   Oral   Take 1 tablet by mouth at bedtime.          Marland Kitchen scopolamine (TRANSDERM-SCOP, 1.5 MG,) 1 MG/3DAYS   Transdermal   Place 1 patch (1.5 mg total) onto the skin every 3 (three)  days. Patient not taking: Reported on 10/22/2015   10 patch   12     Allergies Codeine  Family History  Problem Relation Age of Onset  . Heart disease Mother   . Depression Mother   . Anxiety disorder Mother     Social History Social History  Substance Use Topics  . Smoking status: Never Smoker   . Smokeless tobacco: Never Used  . Alcohol Use: Yes     Comment: drink occ    Review of Systems Constitutional: No fever/chills Eyes: Blurry vision ENT: No sore throat. Cardiovascular: Denies chest pain. Respiratory: Denies shortness of breath. Gastrointestinal: No abdominal pain.  No nausea, no vomiting.  No diarrhea.  No constipation. Genitourinary: Negative for dysuria.  Increased urination  Musculoskeletal: Negative for back pain. Skin: Negative for rash. Neurological: Negative for headaches, focal weakness or numbness.  10-point ROS otherwise  negative.  ____________________________________________   PHYSICAL EXAM:  VITAL SIGNS: ED Triage Vitals  Enc Vitals Group     BP 10/22/15 1647 146/94 mmHg     Pulse Rate 10/22/15 1647 83     Resp 10/22/15 1647 18     Temp 10/22/15 1647 98.3 F (36.8 C)     Temp Source 10/22/15 1647 Oral     SpO2 10/22/15 1647 100 %     Weight 10/22/15 1647 186 lb (84.369 kg)     Height 10/22/15 1647 5\' 5"  (1.651 m)     Head Cir --      Peak Flow --      Pain Score --      Pain Loc --      Pain Edu? --      Excl. in [place]? --     Constitutional: Alert and oriented. Well appearing and in no acute distress. Eyes: Conjunctivae are normal. PERRL. EOMI. no papilledema on funduscopic exam Head: Atraumatic. Nose: No congestion/rhinnorhea. Mouth/Throat: Mucous membranes are moist.  Oropharynx non-erythematous. Neck: No stridor.   Cardiovascular: Normal rate, regular rhythm. Grossly normal heart sounds.  Good peripheral circulation. Respiratory: Normal respiratory effort.  No retractions. Lungs CTAB. Gastrointestinal: Soft and nontender. No distention. No abdominal bruits. No CVA tenderness. Musculoskeletal: No lower extremity tenderness nor edema.  No joint effusions. Neurologic:  Normal speech and language. No gross focal neurologic deficits are appreciated.  Skin:  Skin is warm, dry and intact. No rash noted. Psychiatric: Mood and affect are normal. Speech and behavior are normal.  ____________________________________________   LABS (all labs ordered are listed, but only abnormal results are displayed)  Labs Reviewed  BASIC METABOLIC PANEL - Abnormal; Notable for the following:    Chloride 99 (*)    Glucose, Bld 330 (*)    All other components within normal limits  URINALYSIS COMPLETEWITH MICROSCOPIC ([facility] ONLY) - Abnormal; Notable for the following:    Color, Urine YELLOW (*)    APPearance CLEAR (*)    Glucose, UA >500 (*)    Squamous Epithelial / LPF 0-5 (*)    All other components  within normal limits  GLUCOSE, CAPILLARY - Abnormal; Notable for the following:    Glucose-Capillary 301 (*)    All other components within normal limits  CBC  CBG MONITORING, ED   ____________________________________________  EKG  Not indicated ____________________________________________  RADIOLOGY   No results found.  ____________________________________________   PROCEDURES  Procedure(s) performed: None  Critical Care performed: No ____________________________________________   INITIAL IMPRESSION / ASSESSMENT AND PLAN / ED COURSE  Pertinent labs & imaging results that  were available during my care of the patient were reviewed by me and considered in my medical decision making (see chart for details).  No evidence of DKA or any other serious complication of hyperglycemia.  Patient's blood sugar was 330 and the rest of the BMP was reassuring.  I explained to her that changing chronic meds in the emergency department is not recommended and I treated her with 1 L of normal saline to further lower her hyperglycemia.  I recommended close outpatient follow-up with her PCP  ____________________________________________  FINAL CLINICAL IMPRESSION(S) / ED DIAGNOSES  Final diagnoses:  Hyperglycemia      NEW MEDICATIONS STARTED DURING THIS VISIT:  New Prescriptions   No medications on file     Hinda Kehr, MD 10/22/15 1945  Hinda Kehr, MD 10/22/15 2024

## 2015-10-22 NOTE — ED Notes (Signed)
Pt reports blood sugar was 588 on Friday and used an insulin pen she had from before (unsure of what type of insulin) and then her sugar dropped low so she stopped.  Today blood sugar 426 at home per pt. Also c/o blurry vision.  Increased urination.

## 2015-10-22 NOTE — Discharge Instructions (Signed)
As we discussed, though your blood sugar is running high, it is not dangerous at this time.  Making adjustments in the Emergency Department (ED) and possibly causing your glucose level to drop too low is more dangerous than continuing your current medications at this time until you can follow up with your clinic doctor. ° °Please continue your medications and follow up with your regular doctor as recommended in these documents.  If you develop new or worsening symptoms that concern you, please return to the Emergency Department. ° ° °Hyperglycemia °Hyperglycemia occurs when the glucose (sugar) in your blood is too high. Hyperglycemia can happen for many reasons, but it most often happens to people who do not know they have diabetes or are not managing their diabetes properly.  °CAUSES  °Whether you have diabetes or not, there are other causes of hyperglycemia. Hyperglycemia can occur when you have diabetes, but it can also occur in other situations that you might not be as aware of, such as: °Diabetes °· If you have diabetes and are having problems controlling your blood glucose, hyperglycemia could occur because of some of the following reasons: °¨ Not following your meal plan. °¨ Not taking your diabetes medications or not taking it properly. °¨ Exercising less or doing less activity than you normally do. °¨ Being sick. °Pre-diabetes °· This cannot be ignored. Before people develop Type 2 diabetes, they almost always have "pre-diabetes." This is when your blood glucose levels are higher than normal, but not yet high enough to be diagnosed as diabetes. Research has shown that some long-term damage to the body, especially the heart and circulatory system, may already be occurring during pre-diabetes. If you take action to manage your blood glucose when you have pre-diabetes, you may delay or prevent Type 2 diabetes from developing. °Stress °· If you have diabetes, you may be "diet" controlled or on oral medications  or insulin to control your diabetes. However, you may find that your blood glucose is higher than usual in the hospital whether you have diabetes or not. This is often referred to as "stress hyperglycemia." Stress can elevate your blood glucose. This happens because of hormones put out by the body during times of stress. If stress has been the cause of your high blood glucose, it can be followed regularly by your caregiver. That way he/she can make sure your hyperglycemia does not continue to get worse or progress to diabetes. °Steroids °· Steroids are medications that act on the infection fighting system (immune system) to block inflammation or infection. One side effect can be a rise in blood glucose. Most people can produce enough extra insulin to allow for this rise, but for those who cannot, steroids make blood glucose levels go even higher. It is not unusual for steroid treatments to "uncover" diabetes that is developing. It is not always possible to determine if the hyperglycemia will go away after the steroids are stopped. A special blood test called an A1c is sometimes done to determine if your blood glucose was elevated before the steroids were started. °SYMPTOMS °· Thirsty. °· Frequent urination. °· Dry mouth. °· Blurred vision. °· Tired or fatigue. °· Weakness. °· Sleepy. °· Tingling in feet or leg. °DIAGNOSIS  °Diagnosis is made by monitoring blood glucose in one or all of the following ways: °· A1c test. This is a chemical found in your blood. °· Fingerstick blood glucose monitoring. °· Laboratory results. °TREATMENT  °First, knowing the cause of the hyperglycemia is important before the   hyperglycemia can be treated. Treatment may include, but is not be limited to: °· Education. °· Change or adjustment in medications. °· Change or adjustment in meal plan. °· Treatment for an illness, infection, etc. °· More frequent blood glucose monitoring. °· Change in exercise plan. °· Decreasing or stopping  steroids. °· Lifestyle changes. °HOME CARE INSTRUCTIONS  °· Test your blood glucose as directed. °· Exercise regularly. Your caregiver will give you instructions about exercise. Pre-diabetes or diabetes which comes on with stress is helped by exercising. °· Eat wholesome, balanced meals. Eat often and at regular, fixed times. Your caregiver or nutritionist will give you a meal plan to guide your sugar intake. °· Being at an ideal weight is important. If needed, losing as little as 10 to 15 pounds may help improve blood glucose levels. °SEEK MEDICAL CARE IF:  °· You have questions about medicine, activity, or diet. °· You continue to have symptoms (problems such as increased thirst, urination, or weight gain). °SEEK IMMEDIATE MEDICAL CARE IF:  °· You are vomiting or have diarrhea. °· Your breath smells fruity. °· You are breathing faster or slower. °· You are very sleepy or incoherent. °· You have numbness, tingling, or pain in your feet or hands. °· You have chest pain. °· Your symptoms get worse even though you have been following your caregiver's orders. °· If you have any other questions or concerns. °  °This information is not intended to replace advice given to you by your health care provider. Make sure you discuss any questions you have with your health care provider. °  °Document Released: 04/08/2001 Document Revised: 01/05/2012 Document Reviewed: 06/19/2015 °Elsevier Interactive Patient Education ©2016 [employer] ° °

## 2015-10-30 ENCOUNTER — Ambulatory Visit: Payer: Managed Care, Other (non HMO) | Admitting: Family Medicine

## 2015-11-05 ENCOUNTER — Ambulatory Visit: Payer: Managed Care, Other (non HMO) | Admitting: Family Medicine

## 2015-11-06 ENCOUNTER — Telehealth: Payer: Self-pay

## 2015-11-06 NOTE — Telephone Encounter (Signed)
Patient called stating her blood sugar has been greatly elevated and her vision is very blurry. Patient has been seen at the ER and was only given fluids. She was given an appt for 11/07/15 at 10:20am.

## 2015-11-07 ENCOUNTER — Encounter: Payer: Self-pay | Admitting: Family Medicine

## 2015-11-07 ENCOUNTER — Ambulatory Visit (INDEPENDENT_AMBULATORY_CARE_PROVIDER_SITE_OTHER): Payer: Managed Care, Other (non HMO) | Admitting: Family Medicine

## 2015-11-07 VITALS — BP 122/70 | HR 91 | Temp 98.6°F | Resp 16 | Ht 65.0 in | Wt 185.6 lb

## 2015-11-07 DIAGNOSIS — I10 Essential (primary) hypertension: Secondary | ICD-10-CM

## 2015-11-07 DIAGNOSIS — E785 Hyperlipidemia, unspecified: Secondary | ICD-10-CM | POA: Diagnosis not present

## 2015-11-07 DIAGNOSIS — E1169 Type 2 diabetes mellitus with other specified complication: Secondary | ICD-10-CM | POA: Diagnosis not present

## 2015-11-07 DIAGNOSIS — E1100 Type 2 diabetes mellitus with hyperosmolarity without nonketotic hyperglycemic-hyperosmolar coma (NKHHC): Secondary | ICD-10-CM | POA: Diagnosis not present

## 2015-11-07 LAB — POCT GLYCOSYLATED HEMOGLOBIN (HGB A1C): Hemoglobin A1C: 11.4

## 2015-11-07 LAB — POCT URINALYSIS DIPSTICK
BILIRUBIN UA: NEGATIVE
Blood, UA: NEGATIVE
GLUCOSE UA: 500
Ketones, UA: NEGATIVE
LEUKOCYTES UA: NEGATIVE
NITRITE UA: NEGATIVE
Protein, UA: NEGATIVE
Spec Grav, UA: 1.02
UROBILINOGEN UA: NEGATIVE
pH, UA: 5

## 2015-11-07 LAB — GLUCOSE, POCT (MANUAL RESULT ENTRY): POC Glucose: 102 mg/dl — AB (ref 70–99)

## 2015-11-07 MED ORDER — GLUCOSE BLOOD VI STRP: ORAL_STRIP | Status: DC

## 2015-11-07 MED ORDER — ROSUVASTATIN CALCIUM 20 MG PO TABS: 20.0000 mg | ORAL_TABLET | Freq: Every day | ORAL | Status: DC

## 2015-11-07 MED ORDER — LISINOPRIL 10 MG PO TABS: 10.0000 mg | ORAL_TABLET | Freq: Every day | ORAL | Status: DC

## 2015-11-07 MED ORDER — OMEPRAZOLE 20 MG PO CPDR
20.0000 mg | DELAYED_RELEASE_CAPSULE | Freq: Every day | ORAL | Status: DC
Start: 2015-11-07 — End: 2017-03-16

## 2015-11-07 MED ORDER — SERTRALINE HCL 100 MG PO TABS: 100.0000 mg | ORAL_TABLET | Freq: Every day | ORAL | Status: DC

## 2015-11-07 MED ORDER — TRIAMTERENE-HCTZ 37.5-25 MG PO TABS: 1.0000 | ORAL_TABLET | Freq: Every day | ORAL | Status: DC

## 2015-11-07 MED ORDER — INSULIN DEGLUDEC 200 UNIT/ML ~~LOC~~ SOPN: 10.0000 [IU] | PEN_INJECTOR | Freq: Every evening | SUBCUTANEOUS | Status: DC | PRN

## 2015-11-07 NOTE — Progress Notes (Signed)
Name: Andrea Mcdonald   MRN: [id number]    DOB: 04/18/1961   Date:11/07/2015       Progress Note  Subjective  Chief Complaint  Chief Complaint  Patient presents with  . Diabetes    ER follow up BS at 588 when at ER 300, vision changes, thirsty,   . Hypertension    4 month follow up   . Hyperlipidemia    HPI  Diabetes    Patient presents for follow-up of diabetes which is present for over 5 years. Is currently on a regimen of glipizide 10 mg twice a day D and metformin 1000 mg twice a day. She was also placed on Imdur, but stopped it on her own accord. Blood sugars have been extremely high recently has been as 588 home. A few days ago she presented emergency department she was given IV fluids. Blood sugars were down under 200 with the time she left the emergency department. Blood sugar was 214 at home earlier today . Patient states moderately getting compliance with their diet and exercise. There's been no hypoglycemic episodes and there is no polyuria polydipsia polyphagia. But she is experiencing some visual variation. His average fasting glucoses been in the low around 214 at home today with a high around over 500 a week or so ago . There is no end organ disease.  Last diabetic eye exam was last year.   Last visit with dietitian was over year ago. Last microalbumin was obtained February 2016 and was 0 .   Hypertension   Patient presents for follow-up of hypertension. It has been present for over over 5 years.  Patient states that there is compliance with medical regimen which consists of  . There is no end organ disease. Cardiac risk factors include hypertension hyperlipidemia and diabetes.  Exercise regimen consist of minimal walking .  Diet consist of restricted salt .  Hyperlipidemia  Patient has a history of hyperlipidemia for over 5  years.  Current medical regimen consist of Crestor 20 mg daily at bedtime .  Compliance is good .  Diet and exercise are currently followed  intermittently .  Risk factors for cardiovascular disease include hyperlipidemia hypertension diabetes .   There have been no side effects from the medication.      Past Medical History  Diagnosis Date  . Diabetes mellitus without complication ([place])   . Hypertension 05/25/2013  . High cholesterol   . Plantar fasciitis, bilateral 05/25/2013    Social History  Substance Use Topics  . Smoking status: Never Smoker   . Smokeless tobacco: Never Used  . Alcohol Use: Yes     Comment: drink occ     Current outpatient prescriptions:  .  aspirin 81 MG chewable tablet, Chew 81 mg by mouth at bedtime., Disp: , Rfl:  .  glipiZIDE (GLUCOTROL XL) 10 MG 24 hr tablet, Take 10 mg by mouth at bedtime. , Disp: , Rfl:  .  lisinopril (PRINIVIL,ZESTRIL) 10 MG tablet, Take 1 tablet (10 mg total) by mouth at bedtime., Disp: 30 tablet, Rfl: 5 .  metFORMIN (GLUCOPHAGE) 1000 MG tablet, Take 1,000 mg by mouth at bedtime. , Disp: , Rfl:  .  omeprazole (PRILOSEC) 20 MG capsule, Take 1 capsule (20 mg total) by mouth daily., Disp: 30 capsule, Rfl: 3 .  rosuvastatin (CRESTOR) 20 MG tablet, Take 1 tablet (20 mg total) by mouth at bedtime., Disp: 30 tablet, Rfl: 5 .  scopolamine (TRANSDERM-SCOP, 1.5 MG,) 1 MG/3DAYS, Place 1 patch (  1.5 mg total) onto the skin every 3 (three) days. (Patient not taking: Reported on 10/22/2015), Disp: 10 patch, Rfl: 12 .  sertraline (ZOLOFT) 100 MG tablet, Take 1 tablet (100 mg total) by mouth at bedtime., Disp: 30 tablet, Rfl: 5 .  triamterene-hydrochlorothiazide (MAXZIDE-25) 37.5-25 MG tablet, Take 1 tablet by mouth at bedtime., Disp: 30 tablet, Rfl: 5  Allergies  Allergen Reactions  . Codeine Itching    Review of Systems  Constitutional: Negative for fever, chills and weight loss.  HENT: Negative for congestion, hearing loss, sore throat and tinnitus.   Eyes: Positive for blurred vision and double vision. Negative for redness.  Respiratory: Negative for cough, hemoptysis and  shortness of breath.   Cardiovascular: Negative for chest pain, palpitations, orthopnea, claudication and leg swelling.  Gastrointestinal: Negative for heartburn, nausea, vomiting, diarrhea, constipation and blood in stool.  Genitourinary: Negative for dysuria, urgency, frequency and hematuria.  Musculoskeletal: Negative for myalgias, back pain, joint pain, falls and neck pain.  Skin: Negative for itching.  Neurological: Negative for dizziness, tingling, tremors, focal weakness, seizures, loss of consciousness, weakness and headaches.  Endo/Heme/Allergies: Does not bruise/bleed easily.  Psychiatric/Behavioral: Negative for depression and substance abuse. The patient is not nervous/anxious and does not have insomnia.      Objective  Filed Vitals:   11/07/15 1030  BP: 122/70  Pulse: 91  Temp: 98.6 F (37 C)  TempSrc: Oral  Resp: 16  Height: 5\' 5"  (1.651 m)  Weight: 185 lb 9.6 oz (84.188 kg)  SpO2: 97%     Physical Exam  Constitutional: She is oriented to person, place, and time and well-developed, well-nourished, and in no distress.  HENT:  Head: Normocephalic.  Eyes: EOM are normal. Pupils are equal, round, and reactive to light.  Neck: Normal range of motion. No thyromegaly present.  Cardiovascular: Normal rate, regular rhythm and normal heart sounds.   No murmur heard. Pulmonary/Chest: Effort normal and breath sounds normal.  Abdominal: Soft. Bowel sounds are normal.  Musculoskeletal: Normal range of motion. She exhibits no edema.  Neurological: She is alert and oriented to person, place, and time. No cranial nerve deficit. Gait normal.  Skin: Skin is warm and dry. No rash noted.  Psychiatric: Memory and affect normal.      Assessment & Plan   1. Type 2 diabetes mellitus with other specified complication (HCC) (Control with patient having discontinue Invokana - POCT HgB A1C - POCT Glucose (CBG) - POCT urinalysis dipstick - Insulin Degludec (TRESIBA FLEXTOUCH)  200 UNIT/ML SOPN; Inject 10 Units into the skin at bedtime as needed (increase by 1 unit id FBS over 120).  Dispense: 1 pen; Refill: 5 - Fructosamine  2. Uncontrolled type 2 diabetes mellitus with hyperosmolarity without coma, without long-term current use of insulin ([place]) As above  3. Hyperlipidemia Controlled  4. Essential hypertension Controlled

## 2015-11-08 LAB — FRUCTOSAMINE: Fructosamine: 436 umol/L — ABNORMAL HIGH (ref 0–285)

## 2015-11-09 MED ORDER — INSULIN DEGLUDEC 100 UNIT/ML ~~LOC~~ SOPN: 10.0000 [IU] | PEN_INJECTOR | Freq: Every day | SUBCUTANEOUS | Status: DC

## 2015-11-12 ENCOUNTER — Other Ambulatory Visit: Payer: Self-pay | Admitting: Family Medicine

## 2015-11-12 ENCOUNTER — Ambulatory Visit: Payer: Managed Care, Other (non HMO) | Admitting: Podiatry

## 2015-12-04 ENCOUNTER — Other Ambulatory Visit: Payer: Self-pay | Admitting: Family Medicine

## 2015-12-11 ENCOUNTER — Encounter: Payer: Self-pay | Admitting: Family Medicine

## 2015-12-11 ENCOUNTER — Ambulatory Visit
Admission: RE | Admit: 2015-12-11 | Discharge: 2015-12-11 | Disposition: A | Discharge status: Home / Self Care | Payer: Managed Care, Other (non HMO) | Source: Ambulatory Visit | Attending: Family Medicine | Admitting: Family Medicine

## 2015-12-11 ENCOUNTER — Ambulatory Visit (INDEPENDENT_AMBULATORY_CARE_PROVIDER_SITE_OTHER): Payer: Managed Care, Other (non HMO) | Admitting: Family Medicine

## 2015-12-11 VITALS — BP 126/70 | HR 112 | Temp 98.9°F | Resp 16 | Ht 65.0 in | Wt 183.0 lb

## 2015-12-11 DIAGNOSIS — R1011 Right upper quadrant pain: Secondary | ICD-10-CM | POA: Insufficient documentation

## 2015-12-11 DIAGNOSIS — R1032 Left lower quadrant pain: Secondary | ICD-10-CM | POA: Diagnosis present

## 2015-12-11 DIAGNOSIS — E1165 Type 2 diabetes mellitus with hyperglycemia: Secondary | ICD-10-CM | POA: Diagnosis not present

## 2015-12-11 DIAGNOSIS — R1031 Right lower quadrant pain: Secondary | ICD-10-CM | POA: Insufficient documentation

## 2015-12-11 DIAGNOSIS — IMO0002 Reserved for concepts with insufficient information to code with codable children: Secondary | ICD-10-CM

## 2015-12-11 DIAGNOSIS — N182 Chronic kidney disease, stage 2 (mild): Secondary | ICD-10-CM

## 2015-12-11 DIAGNOSIS — E1122 Type 2 diabetes mellitus with diabetic chronic kidney disease: Secondary | ICD-10-CM | POA: Diagnosis not present

## 2015-12-11 DIAGNOSIS — E1169 Type 2 diabetes mellitus with other specified complication: Secondary | ICD-10-CM | POA: Insufficient documentation

## 2015-12-11 LAB — POCT URINALYSIS DIPSTICK
BILIRUBIN UA: NEGATIVE
GLUCOSE UA: NEGATIVE
Ketones, UA: NEGATIVE
NITRITE UA: NEGATIVE
Protein, UA: 30
RBC UA: POSITIVE
Spec Grav, UA: 1.02
Urobilinogen, UA: 0.2
pH, UA: 7

## 2015-12-11 MED ORDER — ONDANSETRON 8 MG PO TBDP: 8.0000 mg | ORAL_TABLET | Freq: Three times a day (TID) | ORAL | Status: DC | PRN

## 2015-12-11 MED ORDER — IBUPROFEN 800 MG PO TABS: 800.0000 mg | ORAL_TABLET | Freq: Three times a day (TID) | ORAL | Status: DC | PRN

## 2015-12-11 NOTE — Patient Instructions (Signed)
Increase water intake, soft bland diet.

## 2015-12-11 NOTE — Progress Notes (Signed)
Name: Andrea Mcdonald   MRN: [id number]    DOB: Dec 27, 1960   Date:12/11/2015       Progress Note  Subjective  Chief Complaint  Chief Complaint  Patient presents with  . Abdominal Pain    RLQ,LLQ and lower onset 4 days ago.  Patient denies any vomiting or diarrhea, but is having nausea and chills    HPI  Andrea Mcdonald is a 55 year old female patient of Dr. Serita Grit [facility]'s who is here to see me today with acute concerns of RLQ AND LLQ abdominal pain for 4 days now associated with nausea w/o vomiting or change in bowel habits. No fevers, rash, recent foreign travel or new foods. She feels sick, low energy. Urine does have a odd smell. No cough, no rash, no constipation.   Per chart review she has poorly controled diabetes 2, last Hba1c 11.4% on 11/07/15.  Past Medical History  Diagnosis Date  . Diabetes mellitus without complication ([place])   . Hypertension 05/25/2013  . High cholesterol   . Plantar fasciitis, bilateral 05/25/2013    Patient Active Problem List   Diagnosis Date Noted  . Flu vaccine need 06/08/2015  . Uncontrolled diabetes mellitus ([place]) 06/07/2015  . Hyperlipidemia 06/07/2015  . Essential hypertension 06/07/2015  . Depression 06/07/2015  . Plantar fasciitis, bilateral     Social History  Substance Use Topics  . Smoking status: Never Smoker   . Smokeless tobacco: Never Used  . Alcohol Use: Yes     Comment: drink occ     Current outpatient prescriptions:  .  aspirin 81 MG chewable tablet, Chew 81 mg by mouth at bedtime., Disp: , Rfl:  .  glipiZIDE (GLUCOTROL XL) 10 MG 24 hr tablet, TAKE ONE TABLET BY MOUTH ONCE DAILY, Disp: 30 tablet, Rfl: 0 .  glucose blood (ONE TOUCH ULTRA TEST) test strip, Use as instructed, Disp: 100 each, Rfl: 12 .  Insulin Degludec (TRESIBA FLEXTOUCH) 100 UNIT/ML SOPN, Inject 10 Units into the skin at bedtime., Disp: 3 pen, Rfl: 0 .  Insulin Degludec (TRESIBA FLEXTOUCH) 200 UNIT/ML SOPN, Inject 10 Units into the skin at bedtime  as needed (increase by 1 unit id FBS over 120)., Disp: 1 pen, Rfl: 5 .  lisinopril (PRINIVIL,ZESTRIL) 10 MG tablet, Take 1 tablet (10 mg total) by mouth at bedtime., Disp: 30 tablet, Rfl: 5 .  metFORMIN (GLUCOPHAGE) 1000 MG tablet, Take 1,000 mg by mouth at bedtime. , Disp: , Rfl:  .  omeprazole (PRILOSEC) 20 MG capsule, Take 1 capsule (20 mg total) by mouth daily., Disp: 30 capsule, Rfl: 3 .  rosuvastatin (CRESTOR) 20 MG tablet, Take 1 tablet (20 mg total) by mouth at bedtime., Disp: 30 tablet, Rfl: 5 .  scopolamine (TRANSDERM-SCOP, 1.5 MG,) 1 MG/3DAYS, Place 1 patch (1.5 mg total) onto the skin every 3 (three) days. (Patient not taking: Reported on 10/22/2015), Disp: 10 patch, Rfl: 12 .  sertraline (ZOLOFT) 100 MG tablet, Take 1 tablet (100 mg total) by mouth at bedtime., Disp: 30 tablet, Rfl: 5 .  triamterene-hydrochlorothiazide (MAXZIDE-25) 37.5-25 MG tablet, Take 1 tablet by mouth at bedtime., Disp: 30 tablet, Rfl: 5  Allergies  Allergen Reactions  . Codeine Itching    Review of Systems  CONSTITUTIONAL: No significant weight changes, fever. Yes chills, weakness or fatigue.  HEENT:  - Eyes: No visual changes.  - Ears: No auditory changes. No pain.  - Nose: No sneezing, congestion, runny nose. - Throat: No sore throat. No changes in swallowing. SKIN:  No rash or itching.  CARDIOVASCULAR: No chest pain, chest pressure or chest discomfort. No palpitations or edema.  RESPIRATORY: No shortness of breath, cough or sputum.  GASTROINTESTINAL: Yes nausea. No vomiting. No changes in bowel habits. Yes abdominal pain. GENITOURINARY: No dysuria. No frequency. No discharge. Some change to urinary odor. NEUROLOGICAL: No headache, dizziness, syncope, paralysis, ataxia, numbness or tingling in the extremities. No memory changes. No change in bowel or bladder control.  MUSCULOSKELETAL: No joint pain. No muscle pain. HEMATOLOGIC: No anemia, bleeding or bruising.  LYMPHATICS: No enlarged lymph nodes.   PSYCHIATRIC: No change in mood. No change in sleep pattern.  ENDOCRINOLOGIC: No reports of sweating, cold or heat intolerance. No polyuria or polydipsia.     Objective  Blood pressure 126/70, pulse 112, temperature 98.9 F (37.2 C), temperature source Oral, resp. rate 16, height 5\' 5"  (1.651 m), weight 183 lb (83.008 kg), SpO2 97 %.  Body mass index is 30.45 kg/(m^2).   Physical Exam  Constitutional: Patient appears well-developed and well-nourished.  HEENT:  - Head: Normocephalic and atraumatic.  - Ears: Bilateral TMs gray, no erythema or effusion - Nose: Nasal mucosa moist - Mouth/Throat: Oropharynx is clear and moist. No tonsillar hypertrophy or erythema. No post nasal drainage.  - Eyes: Conjunctivae clear, EOM movements normal. PERRLA. No scleral icterus.  Neck: Normal range of motion. Neck supple. No JVD present. No thyromegaly present.  Cardiovascular: Normal rate, regular rhythm and normal heart sounds.  No murmur heard.  Pulmonary/Chest: Effort normal and breath sounds normal. No respiratory distress. Abdomen: Soft, normal BS, no fluid, no HSM, some tenderness reported on palpation of general lower abdominal wall. Musculoskeletal: Normal range of motion bilateral UE and LE, no joint effusions. Peripheral vascular: Bilateral LE no edema. Neurological: CN II-XII grossly intact with no focal deficits. Alert and oriented to person, place, and time. Coordination, balance, strength, speech and gait are normal.  Skin: Skin is warm and dry. No rash noted. No erythema.  Psychiatric: Patient has an anxious mood and affect. Behavior is normal in office today. Judgment and thought content normal in office today.   Results for orders placed or performed in visit on 12/11/15 (from the past 24 hour(s))  POCT urinalysis dipstick     Status: Abnormal   Collection Time: 12/11/15  1:43 PM  Result Value Ref Range   Color, UA yell    Clarity, UA clear    Glucose, UA neg    Bilirubin, UA  neg    Ketones, UA neg    Spec Grav, UA 1.020    Blood, UA positive    pH, UA 7.0    Protein, UA 30    Urobilinogen, UA 0.2    Nitrite, UA neg    Leukocytes, UA Trace (A) Negative    Assessment & Plan  1. Right lower quadrant abdominal pain Etiologies considered: Cystitis, colitis, diabetic related disorder such as gastroparesis or even HONK or DKA. She has had a hysterectomy. Will get blood work, abdominal x-ray. Instructed patient on modified diet and increased hydration. Vitals stable.   - POCT urinalysis dipstick - DG Abd 2 Views; Future - CBC with Differential/Platelet - Comprehensive metabolic panel - Amylase - Lipase - Urine Culture - ibuprofen (ADVIL,MOTRIN) 800 MG tablet; Take 1 tablet (800 mg total) by mouth every 8 (eight) hours as needed.  Dispense: 30 tablet; Refill: 1 - ondansetron (ZOFRAN-ODT) 8 MG disintegrating tablet; Take 1 tablet (8 mg total) by mouth every 8 (eight) hours as needed for  nausea or vomiting.  Dispense: 30 tablet; Refill: 1  2. Abdominal pain, LLQ (left lower quadrant)  - DG Abd 2 Views; Future - CBC with Differential/Platelet - Comprehensive metabolic panel - Amylase - Lipase - Urine Culture - ibuprofen (ADVIL,MOTRIN) 800 MG tablet; Take 1 tablet (800 mg total) by mouth every 8 (eight) hours as needed.  Dispense: 30 tablet; Refill: 1 - ondansetron (ZOFRAN-ODT) 8 MG disintegrating tablet; Take 1 tablet (8 mg total) by mouth every 8 (eight) hours as needed for nausea or vomiting.  Dispense: 30 tablet; Refill: 1  3. Uncontrolled type 2 diabetes mellitus with stage 2 chronic kidney disease, without long-term current use of insulin (HCC) Previously Hba1c 11.3% if she has continued poor DM 2 control her current symptoms may be related.

## 2015-12-12 ENCOUNTER — Telehealth: Payer: Self-pay | Admitting: Family Medicine

## 2015-12-12 DIAGNOSIS — N3090 Cystitis, unspecified without hematuria: Secondary | ICD-10-CM

## 2015-12-12 LAB — CBC WITH DIFFERENTIAL/PLATELET
BASOS ABS: 0 10*3/uL (ref 0.0–0.2)
BASOS: 0 %
EOS (ABSOLUTE): 0 10*3/uL (ref 0.0–0.4)
Eos: 0 %
HEMOGLOBIN: 12.2 g/dL (ref 11.1–15.9)
Hematocrit: 36.4 % (ref 34.0–46.6)
IMMATURE GRANS (ABS): 0 10*3/uL (ref 0.0–0.1)
Immature Granulocytes: 0 %
LYMPHS: 15 %
Lymphocytes Absolute: 1.4 10*3/uL (ref 0.7–3.1)
MCH: 28.6 pg (ref 26.6–33.0)
MCHC: 33.5 g/dL (ref 31.5–35.7)
MCV: 85 fL (ref 79–97)
MONOCYTES: 6 %
Monocytes Absolute: 0.6 10*3/uL (ref 0.1–0.9)
NEUTROS ABS: 7.7 10*3/uL — AB (ref 1.4–7.0)
Neutrophils: 79 %
Platelets: 210 10*3/uL (ref 150–379)
RBC: 4.26 x10E6/uL (ref 3.77–5.28)
RDW: 14 % (ref 12.3–15.4)
WBC: 9.8 10*3/uL (ref 3.4–10.8)

## 2015-12-12 LAB — COMPREHENSIVE METABOLIC PANEL
A/G RATIO: 2 (ref 1.1–2.5)
ALBUMIN: 4.5 g/dL (ref 3.5–5.5)
ALK PHOS: 75 IU/L (ref 39–117)
ALT: 17 IU/L (ref 0–32)
AST: 14 IU/L (ref 0–40)
BUN/Creatinine Ratio: 17 (ref 9–23)
BUN: 15 mg/dL (ref 6–24)
Bilirubin Total: 0.9 mg/dL (ref 0.0–1.2)
CALCIUM: 9.7 mg/dL (ref 8.7–10.2)
CHLORIDE: 98 mmol/L (ref 96–106)
CO2: 25 mmol/L (ref 18–29)
Creatinine, Ser: 0.9 mg/dL (ref 0.57–1.00)
GFR calc Af Amer: 83 mL/min/{1.73_m2} (ref >59)
GFR, EST NON AFRICAN AMERICAN: 72 mL/min/{1.73_m2} (ref >59)
Globulin, Total: 2.2 g/dL (ref 1.5–4.5)
Glucose: 222 mg/dL — ABNORMAL HIGH (ref 65–99)
POTASSIUM: 3.5 mmol/L (ref 3.5–5.2)
SODIUM: 144 mmol/L (ref 134–144)
TOTAL PROTEIN: 6.7 g/dL (ref 6.0–8.5)

## 2015-12-12 LAB — AMYLASE: AMYLASE: 44 U/L (ref 31–124)

## 2015-12-12 LAB — LIPASE: LIPASE: 36 U/L (ref 0–59)

## 2015-12-12 NOTE — Telephone Encounter (Signed)
PT IS ASKING FOR YOU TO CALL HER AND YOU CNA LEAVE A MESSAGE ABOUT HER RESULTS

## 2015-12-13 LAB — URINE CULTURE

## 2015-12-13 MED ORDER — CIPROFLOXACIN HCL 500 MG PO TABS
500.0000 mg | ORAL_TABLET | Freq: Two times a day (BID) | ORAL | Status: DC
Start: 2015-12-13 — End: 2016-02-12

## 2015-12-13 NOTE — Telephone Encounter (Signed)
Andrea Mcdonald, please speak with Andrea Mcdonald and explain to her although her lab work and abdominal x-ray did not show any abnormalities to explain her abdominal pain other than some constipation her [place] for an infection. I have sent Cipro antibiotic for her to start ASAP. I also want her to continue a liquid bland diet for a few days and take OTC colace or miralax to help clean out her constipation. I hope this helps ease her symptoms.

## 2015-12-14 ENCOUNTER — Telehealth: Payer: Self-pay | Admitting: Family Medicine

## 2015-12-14 NOTE — Telephone Encounter (Signed)
Yes patient has been notified.

## 2015-12-14 NOTE — Telephone Encounter (Signed)
I sent a message yesterday but didn't see documentation that patient was called.  [name], please speak with [name] and explain to her although her lab work and abdominal x-ray did not show any abnormalities to explain her abdominal pain other than some constipation [name] for an infection. I have sent Cipro antibiotic for her to start ASAP. I also want her to continue a liquid bland diet for a few days and take OTC colace or miralax to help clean out her constipation. I hope this helps ease her symptoms.

## 2015-12-18 ENCOUNTER — Ambulatory Visit: Payer: Managed Care, Other (non HMO) | Admitting: Family Medicine

## 2016-01-08 ENCOUNTER — Ambulatory Visit: Payer: Managed Care, Other (non HMO) | Admitting: Family Medicine

## 2016-01-24 ENCOUNTER — Other Ambulatory Visit: Payer: Self-pay | Admitting: Family Medicine

## 2016-02-12 ENCOUNTER — Ambulatory Visit (INDEPENDENT_AMBULATORY_CARE_PROVIDER_SITE_OTHER): Payer: Managed Care, Other (non HMO) | Admitting: Family Medicine

## 2016-02-12 ENCOUNTER — Encounter: Payer: Self-pay | Admitting: Family Medicine

## 2016-02-12 VITALS — BP 101/62 | HR 70 | Temp 98.3°F | Resp 16 | Ht 65.0 in | Wt 181.6 lb

## 2016-02-12 DIAGNOSIS — E785 Hyperlipidemia, unspecified: Secondary | ICD-10-CM

## 2016-02-12 DIAGNOSIS — Z6830 Body mass index (BMI) 30.0-30.9, adult: Secondary | ICD-10-CM

## 2016-02-12 DIAGNOSIS — F329 Major depressive disorder, single episode, unspecified: Secondary | ICD-10-CM | POA: Diagnosis not present

## 2016-02-12 DIAGNOSIS — F32A Depression, unspecified: Secondary | ICD-10-CM

## 2016-02-12 DIAGNOSIS — I10 Essential (primary) hypertension: Secondary | ICD-10-CM

## 2016-02-12 DIAGNOSIS — Z794 Long term (current) use of insulin: Secondary | ICD-10-CM

## 2016-02-12 DIAGNOSIS — J301 Allergic rhinitis due to pollen: Secondary | ICD-10-CM

## 2016-02-12 DIAGNOSIS — E1122 Type 2 diabetes mellitus with diabetic chronic kidney disease: Secondary | ICD-10-CM | POA: Diagnosis not present

## 2016-02-12 DIAGNOSIS — J309 Allergic rhinitis, unspecified: Secondary | ICD-10-CM | POA: Insufficient documentation

## 2016-02-12 DIAGNOSIS — E663 Overweight: Secondary | ICD-10-CM | POA: Insufficient documentation

## 2016-02-12 DIAGNOSIS — N182 Chronic kidney disease, stage 2 (mild): Secondary | ICD-10-CM

## 2016-02-12 LAB — POCT GLYCOSYLATED HEMOGLOBIN (HGB A1C): HEMOGLOBIN A1C: 6.9

## 2016-02-12 MED ORDER — FLUTICASONE PROPIONATE 50 MCG/ACT NA SUSP: 2.0000 | Freq: Every day | NASAL | Status: DC

## 2016-02-12 MED ORDER — ROSUVASTATIN CALCIUM 20 MG PO TABS: 20.0000 mg | ORAL_TABLET | Freq: Every day | ORAL | Status: DC

## 2016-02-12 MED ORDER — LORATADINE 10 MG PO TABS: 10.0000 mg | ORAL_TABLET | Freq: Every day | ORAL | Status: DC

## 2016-02-12 MED ORDER — SERTRALINE HCL 100 MG PO TABS: 100.0000 mg | ORAL_TABLET | Freq: Every day | ORAL | Status: DC

## 2016-02-12 MED ORDER — OXYMETAZOLINE HCL 0.05 % NA SOLN: 1.0000 | Freq: Two times a day (BID) | NASAL | Status: DC

## 2016-02-12 MED ORDER — CANAGLIFLOZIN 100 MG PO TABS: 100.0000 mg | ORAL_TABLET | Freq: Every day | ORAL | Status: DC

## 2016-02-12 MED ORDER — METFORMIN HCL 1000 MG PO TABS: 1000.0000 mg | ORAL_TABLET | Freq: Every day | ORAL | Status: DC

## 2016-02-12 MED ORDER — INSULIN DEGLUDEC 100 UNIT/ML ~~LOC~~ SOPN: 10.0000 [IU] | PEN_INJECTOR | Freq: Every day | SUBCUTANEOUS | Status: DC

## 2016-02-12 NOTE — Assessment & Plan Note (Signed)
Continue crestor. Check lipid panel.  

## 2016-02-12 NOTE — Progress Notes (Signed)
Subjective:    Patient ID: Andrea Mcdonald, female    DOB: 1960/12/10, 55 y.o.   MRN: [id number]  HPI: Andrea Mcdonald is a 55 y.o. female presenting on 02/12/2016 for Establish Care   HPI  Pt presents to establish care today. Previous care provider was Dr. Rutherford Nail- [facility].  It has been 2 months since Her last PCP visit. Records from previous provider will be requested and reviewed. Current medical problems include:  Diabetes: Diagnosed 6 years ago. Last A1c was 11.1% in January. Now down to 6.9%- using tresiba once daily- takes 10 units when sugars are > 125. Taking glipizide 10mg  in the AM- out for 1 month. Taking metformin 1000 BID. Avg sugars- AM sugars 160, evening sugars 200. She is not taking tresiba regularly because her sugars are dropping- 70's. Has made diet and exercise changes.  Exercise- walks daily- 45 minutes. Decreased portion sizes. Lost 4lbs since January. Last Eye exam May 2016- has appt scheduled June 22. No numbness or tingling in her feet.  Hypertension: Taking maxizide and lisinopril. Taking maxizde for swelling. No chest pain, SOB, or visual changes.  Hyperlipidemia: Taking crestor. No muscle aches or pains with crestor.  H/o kidney stones- last a few years ago.  Anxiety: takig zoloft- helping with daily symptoms. Has taken for many years with good results.  Pt also has concerns about obesity- exercising and eating well doesn't feel she can lose weight. Has been struggling, is wondering if there is a medication to help.   Health maintenance:  Hysterectomy- for bleeding or ademyosis. Still has 1 ovary. Has hot flashes occasionally over past 3 years.  Colonoscopy: 2014- was normal. Due 2024 Mammogram: Last 08/2014- done at [facility]. All been normal.      Past Medical History  Diagnosis Date  . Diabetes mellitus without complication ([place])   . Hypertension 05/25/2013  . High cholesterol   . Plantar fasciitis, bilateral 05/25/2013    Social History   Social History  . Marital Status: Married    Spouse Name: N/A  . Number of Children: N/A  . Years of Education: N/A   Occupational History  . Not on file.   Social History Main Topics  . Smoking status: Never Smoker   . Smokeless tobacco: Never Used  . Alcohol Use: Yes     Comment: drink occ  . Drug Use: No  . Sexual Activity: Yes   Other Topics Concern  . Not on file   Social History Narrative   Family History  Problem Relation Age of Onset  . Heart disease Mother   . Depression Mother   . Anxiety disorder Mother    Current Outpatient Prescriptions on File Prior to Visit  Medication Sig  . aspirin 81 MG chewable tablet Chew 81 mg by mouth at bedtime.  Marland Kitchen ibuprofen (ADVIL,MOTRIN) 800 MG tablet Take 1 tablet (800 mg total) by mouth every 8 (eight) hours as needed.  . Insulin Degludec (TRESIBA FLEXTOUCH) 200 UNIT/ML SOPN Inject 10 Units into the skin at bedtime as needed (increase by 1 unit id FBS over 120).  Marland Kitchen lisinopril (PRINIVIL,ZESTRIL) 10 MG tablet Take 1 tablet (10 mg total) by mouth at bedtime.  Marland Kitchen omeprazole (PRILOSEC) 20 MG capsule Take 1 capsule (20 mg total) by mouth daily.   No current facility-administered medications on file prior to visit.    Review of Systems  Constitutional: Negative for fever and chills.  HENT: Positive for congestion, postnasal drip, rhinorrhea and sore throat.  Negative for sinus pressure and trouble swallowing.   Respiratory: Negative for cough, chest tightness and wheezing.   Cardiovascular: Negative for chest pain and leg swelling.  Gastrointestinal: Negative for nausea, vomiting, abdominal pain, diarrhea and constipation.  Endocrine: Negative.  Negative for cold intolerance, heat intolerance, polydipsia, polyphagia and polyuria.  Genitourinary: Negative for dysuria and difficulty urinating.  Musculoskeletal: Negative.   Neurological: Negative for dizziness, light-headedness and numbness.   Psychiatric/Behavioral: Negative.    Per HPI unless specifically indicated above     Objective:    BP 101/62 mmHg  Pulse 70  Temp(Src) 98.3 F (36.8 C) (Oral)  Resp 16  Ht 5\' 5"  (1.651 m)  Wt 181 lb 9.6 oz (82.373 kg)  BMI 30.22 kg/m2  Wt Readings from Last 3 Encounters:  02/12/16 181 lb 9.6 oz (82.373 kg)  12/11/15 183 lb (83.008 kg)  11/07/15 185 lb 9.6 oz (84.188 kg)    Depression screen [facility] 2/9 02/12/2016 11/07/2015 06/07/2015  Decreased Interest 0 0 0  Down, Depressed, Hopeless 0 0 0  PHQ - 2 Score 0 0 0   GAD 7 : Generalized Anxiety Score 02/12/2016  Nervous, Anxious, on Edge 1  Control/stop worrying 2  Worry too much - different things 1  Trouble relaxing 3  Restless 0  Easily annoyed or irritable 0  Afraid - awful might happen 0  Total GAD 7 Score 7  Anxiety Difficulty Somewhat difficult   Physical Exam  Constitutional: She is oriented to person, place, and time. She appears well-developed and well-nourished.  HENT:  Head: Normocephalic and atraumatic.  Right Ear: Hearing normal. A middle ear effusion is present.  Left Ear: Hearing and tympanic membrane normal.  Nose: Right sinus exhibits no maxillary sinus tenderness and no frontal sinus tenderness. Left sinus exhibits no maxillary sinus tenderness and no frontal sinus tenderness.  Mouth/Throat: Uvula is midline, oropharynx is clear and moist and mucous membranes are normal.  Boggy turbinates.  Neck: Neck supple.  Cardiovascular: Normal rate, regular rhythm and normal heart sounds.  Exam reveals no gallop and no friction rub.   No murmur heard. Pulmonary/Chest: Effort normal and breath sounds normal. She has no wheezes. She exhibits no tenderness.  Abdominal: Soft. Normal appearance and bowel sounds are normal. She exhibits no distension and no mass. There is no tenderness. There is no rebound and no guarding.  Musculoskeletal: Normal range of motion. She exhibits no edema or tenderness.  Lymphadenopathy:     She has no cervical adenopathy.  Neurological: She is alert and oriented to person, place, and time.  Skin: Skin is warm and dry.   Results for orders placed or performed in visit on 02/12/16  POCT HgB A1C  Result Value Ref Range   Hemoglobin A1C 6.9       Assessment & Plan:   Problem List Items Addressed This Visit      Cardiovascular and Mediastinum   Essential hypertension    Controlled to low in office. STOP maxzide since starting invokana today. Continue diet and lifestyle changes. Check BMET at next visit.  ACE for renal protection.  Return 4 weeks for BP check.       Relevant Medications   rosuvastatin (CRESTOR) 20 MG tablet     Respiratory   Allergic rhinitis    Start flonase and OTC antihistamine for symptoms. PRN afrin for ear congestion. Return if not improving.       Relevant Medications   fluticasone (FLONASE) 50 MCG/ACT nasal spray   loratadine (  CLARITIN) 10 MG tablet   oxymetazoline (AFRIN NASAL SPRAY) 0.05 % nasal spray     Endocrine   Type 2 diabetes mellitus (HCC) - Primary    A1c down to 6.9% with good control. Eye exam will be done in June. Foot exam done. UA microalbumin done today. ACE for renal protection. For weight loss- will try invokana for sugar and blood pressure control. Continue tresiba as ordered.  Hypoglycemia protocol reviewed.  Recheck 1 mos on sugars. Call if sugar > 300       Relevant Medications   canagliflozin (INVOKANA) 100 MG TABS tablet   metFORMIN (GLUCOPHAGE) 1000 MG tablet   rosuvastatin (CRESTOR) 20 MG tablet   Insulin Degludec (TRESIBA FLEXTOUCH) 100 UNIT/ML SOPN   Other Relevant Orders   POCT HgB A1C (Completed)   POCT UA - Microalbumin     Other   Hyperlipidemia    Continue crestor. Check lipid panel.       Relevant Medications   rosuvastatin (CRESTOR) 20 MG tablet   Other Relevant Orders   Lipid panel   Depression    Controlled with zoloft.       Relevant Medications   sertraline (ZOLOFT) 100 MG tablet    Other Relevant Orders   B12 and Folate Panel   VITAMIN D 25 Hydroxy (Vit-D Deficiency, Fractures)   BMI 30.0-30.9,adult      Meds ordered this encounter  Medications  . canagliflozin (INVOKANA) 100 MG TABS tablet    Sig: Take 1 tablet (100 mg total) by mouth daily before breakfast.    Dispense:  30 tablet    Refill:  11    Order Specific Question:  Supervising Provider    Answer:  Arlis Porta [phone]  . fluticasone (FLONASE) 50 MCG/ACT nasal spray    Sig: Place 2 sprays into both nostrils daily.    Dispense:  16 g    Refill:  11    Order Specific Question:  Supervising Provider    Answer:  Arlis Porta [phone]  . loratadine (CLARITIN) 10 MG tablet    Sig: Take 1 tablet (10 mg total) by mouth daily.    Dispense:  30 tablet    Refill:  11    Order Specific Question:  Supervising Provider    Answer:  Arlis Porta [phone]  . oxymetazoline (AFRIN NASAL SPRAY) 0.05 % nasal spray    Sig: Place 1 spray into both nostrils 2 (two) times daily. For 3 days only.    Dispense:  30 mL    Refill:  0    Order Specific Question:  Supervising Provider    Answer:  Arlis Porta [id number]  . metFORMIN (GLUCOPHAGE) 1000 MG tablet    Sig: Take 1 tablet (1,000 mg total) by mouth at bedtime.    Dispense:  60 tablet    Refill:  11    Order Specific Question:  Supervising Provider    Answer:  Arlis Porta [phone]  . rosuvastatin (CRESTOR) 20 MG tablet    Sig: Take 1 tablet (20 mg total) by mouth at bedtime.    Dispense:  60 tablet    Refill:  11    Order Specific Question:  Supervising Provider    Answer:  Arlis Porta [phone]  . Insulin Degludec (TRESIBA FLEXTOUCH) 100 UNIT/ML SOPN    Sig: Inject 10 Units into the skin at bedtime.    Dispense:  3 pen    Refill:  11    Order Specific Question:  Supervising Provider    Answer:  Arlis Porta [phone]  . sertraline (ZOLOFT) 100 MG tablet    Sig: Take 1 tablet (100 mg total) by mouth at  bedtime.    Dispense:  30 tablet    Refill:  11    Order Specific Question:  Supervising Provider    Answer:  Arlis Porta [phone]      Follow up plan: Return in about 4 weeks (around 03/11/2016) for BP.

## 2016-02-12 NOTE — Assessment & Plan Note (Signed)
Controlled to low in office. STOP maxzide since starting invokana today. Continue diet and lifestyle changes. Check BMET at next visit.  ACE for renal protection.  Return 4 weeks for BP check.

## 2016-02-12 NOTE — Assessment & Plan Note (Signed)
Controlled with zoloft.

## 2016-02-12 NOTE — Patient Instructions (Signed)
Great job on your diabetes!  Keep up the good work. We will plan to stop your Glucotrol and try Invokana for your diabetes. Continue to take your Zeb Comfort as directed.  Please check your blood glucose 2 times daily. If your glucose is < 70 mg/dl or you have symptoms of hypoglycemia dizziness, headache, hunger, jitteriness and seizures please drink 4 oz of juice or soda.  Check blood glucose 15 minutes later. If it has not risen to >100, please seek medical attention. If > 100 please eat a snack containing protein such as peanut butter and crackers.  STOP your Maxzide for blood pressure since the invokana will drop it low.   We will check on your blood pressure in 1 mos.

## 2016-02-12 NOTE — Assessment & Plan Note (Signed)
Start flonase and OTC antihistamine for symptoms. PRN afrin for ear congestion. Return if not improving.

## 2016-02-12 NOTE — Assessment & Plan Note (Signed)
A1c down to 6.9% with good control. Eye exam will be done in June. Foot exam done. UA microalbumin done today. ACE for renal protection. For weight loss- will try invokana for sugar and blood pressure control. Continue tresiba as ordered.  Hypoglycemia protocol reviewed.  Recheck 1 mos on sugars. Call if sugar > 300

## 2016-03-21 LAB — VITAMIN D 25 HYDROXY (VIT D DEFICIENCY, FRACTURES): VIT D 25 HYDROXY: 30.5 ng/mL (ref 30.0–100.0)

## 2016-03-21 LAB — LIPID PANEL
CHOL/HDL RATIO: 2.2 ratio (ref 0.0–4.4)
Cholesterol, Total: 126 mg/dL (ref 100–199)
HDL: 58 mg/dL (ref >39)
LDL CALC: 50 mg/dL (ref 0–99)
TRIGLYCERIDES: 89 mg/dL (ref 0–149)
VLDL CHOLESTEROL CAL: 18 mg/dL (ref 5–40)

## 2016-03-21 LAB — B12 AND FOLATE PANEL
Folate: 9.6 ng/mL (ref >3.0)
Vitamin B-12: 274 pg/mL (ref 211–946)

## 2016-04-18 ENCOUNTER — Encounter: Payer: Self-pay | Admitting: Family Medicine

## 2016-04-18 LAB — HM DIABETES EYE EXAM

## 2016-07-01 ENCOUNTER — Encounter: Payer: Managed Care, Other (non HMO) | Admitting: Family Medicine

## 2016-07-08 ENCOUNTER — Ambulatory Visit (INDEPENDENT_AMBULATORY_CARE_PROVIDER_SITE_OTHER): Payer: Managed Care, Other (non HMO) | Admitting: Family Medicine

## 2016-07-08 VITALS — BP 95/51 | HR 70 | Temp 97.9°F | Resp 16 | Ht 65.0 in | Wt 174.6 lb

## 2016-07-08 DIAGNOSIS — Z0000 Encounter for general adult medical examination without abnormal findings: Secondary | ICD-10-CM

## 2016-07-08 DIAGNOSIS — J301 Allergic rhinitis due to pollen: Secondary | ICD-10-CM | POA: Diagnosis not present

## 2016-07-08 DIAGNOSIS — N182 Chronic kidney disease, stage 2 (mild): Secondary | ICD-10-CM | POA: Diagnosis not present

## 2016-07-08 DIAGNOSIS — E1122 Type 2 diabetes mellitus with diabetic chronic kidney disease: Secondary | ICD-10-CM

## 2016-07-08 DIAGNOSIS — I10 Essential (primary) hypertension: Secondary | ICD-10-CM | POA: Diagnosis not present

## 2016-07-08 DIAGNOSIS — Z23 Encounter for immunization: Secondary | ICD-10-CM

## 2016-07-08 DIAGNOSIS — E559 Vitamin D deficiency, unspecified: Secondary | ICD-10-CM | POA: Diagnosis not present

## 2016-07-08 LAB — POCT UA - MICROALBUMIN: Microalbumin Ur, POC: 0 mg/L

## 2016-07-08 MED ORDER — AZELASTINE HCL 0.05 % OP SOLN: 1.0000 [drp] | Freq: Two times a day (BID) | OPHTHALMIC | 12 refills | Status: DC

## 2016-07-08 MED ORDER — FLUTICASONE PROPIONATE 50 MCG/ACT NA SUSP: 2.0000 | Freq: Every day | NASAL | 11 refills | Status: DC

## 2016-07-08 MED ORDER — LORATADINE 10 MG PO TABS: 10.0000 mg | ORAL_TABLET | Freq: Every day | ORAL | 11 refills | Status: DC

## 2016-07-08 MED ORDER — LISINOPRIL 5 MG PO TABS: 5.0000 mg | ORAL_TABLET | Freq: Every day | ORAL | 3 refills | Status: DC

## 2016-07-08 NOTE — Assessment & Plan Note (Signed)
Recheck CMP and A1c with labs today. Continue metformin and invokana. Last A1c 6.9%. Encouraged diet and lifestyle changes UA micro-albumin in UTD. Eye exam done in June of this year at [facility].  Recheck A1c in 3 mos.

## 2016-07-08 NOTE — Patient Instructions (Addendum)
For your allergies: Start taking Flonase once daily to help with your symptoms. You can also use allergy eye drops to help with your symptoms. 1 drop each eye. Daily flonase will help with your years.  Your blood pressure is low. We will reduce your lisinopril to 61m once daily. Just take a 1/2 tablet for now.  We will recheck your blood pressure in 1 month.   Please schedule your mammogram at your leisure. I have given you the card to schedule for this year.   Health Maintenance, Female Adopting a healthy lifestyle and getting preventive care can go a long way to promote health and wellness. Talk with your health care provider about what schedule of regular examinations is right for you. This is a good chance for you to check in with your provider about disease prevention and staying healthy. In between checkups, there are plenty of things you can do on your own. Experts have done a lot of research about which lifestyle changes and preventive measures are most likely to keep you healthy. Ask your health care provider for more information. WEIGHT AND DIET  Eat a healthy diet  Be sure to include plenty of vegetables, fruits, low-fat dairy products, and lean protein.  Do not eat a lot of foods high in solid fats, added sugars, or salt.  Get regular exercise. This is one of the most important things you can do for your health.  Most adults should exercise for at least 150 minutes each week. The exercise should increase your heart rate and make you sweat (moderate-intensity exercise).  Most adults should also do strengthening exercises at least twice a week. This is in addition to the moderate-intensity exercise.  Maintain a healthy weight  Body mass index (BMI) is a measurement that can be used to identify possible weight problems. It estimates body fat based on height and weight. Your health care provider can help determine your BMI and help you achieve or maintain a healthy weight.  For  females 279years of age and older:   A BMI below 18.5 is considered underweight.  A BMI of 18.5 to 24.9 is normal.  A BMI of 25 to 29.9 is considered overweight.  A BMI of 30 and above is considered obese.  Watch levels of cholesterol and blood lipids  You should start having your blood tested for lipids and cholesterol at 56years of age, then have this test every 5 years.  You may need to have your cholesterol levels checked more often if:  Your lipid or cholesterol levels are high.  You are older than 56years of age.  You are at high risk for heart disease.  CANCER SCREENING   Lung Cancer  Lung cancer screening is recommended for adults [id number] old who are at high risk for lung cancer because of a history of smoking.  A yearly low-dose CT scan of the lungs is recommended for people who:  Currently smoke.  Have quit within the past 15 years.  Have at least a 30-pack-year history of smoking. A pack year is smoking an average of one pack of cigarettes a day for 1 year.  Yearly screening should continue until it has been 15 years since you quit.  Yearly screening should stop if you develop a health problem that would prevent you from having lung cancer treatment.  Breast Cancer  Practice breast self-awareness. This means understanding how your breasts normally appear and feel.  It also means doing regular  breast self-exams. Let your health care provider know about any changes, no matter how small.  If you are in your 20s or 30s, you should have a clinical breast exam (CBE) by a health care provider every 1-3 years as part of a regular health exam.  If you are 67 or older, have a CBE every year. Also consider having a breast X-ray (mammogram) every year.  If you have a family history of breast cancer, talk to your health care provider about genetic screening.  If you are at high risk for breast cancer, talk to your health care provider about having an MRI and  a mammogram every year.  Breast cancer gene (BRCA) assessment is recommended for women who have family members with BRCA-related cancers. BRCA-related cancers include:  Breast.  Ovarian.  Tubal.  Peritoneal cancers.  Results of the assessment will determine the need for genetic counseling and BRCA1 and BRCA2 testing. Cervical Cancer Your health care provider may recommend that you be screened regularly for cancer of the pelvic organs (ovaries, uterus, and vagina). This screening involves a pelvic examination, including checking for microscopic changes to the surface of your cervix (Pap test). You may be encouraged to have this screening done every 3 years, beginning at age 83.  For women ages 41-65, health care providers may recommend pelvic exams and Pap testing every 3 years, or they may recommend the Pap and pelvic exam, combined with testing for human papilloma virus (HPV), every 5 years. Some types of HPV increase your risk of cervical cancer. Testing for HPV may also be done on women of any age with unclear Pap test results.  Other health care providers may not recommend any screening for nonpregnant women who are considered low risk for pelvic cancer and who do not have symptoms. Ask your health care provider if a screening pelvic exam is right for you.  If you have had past treatment for cervical cancer or a condition that could lead to cancer, you need Pap tests and screening for cancer for at least 20 years after your treatment. If Pap tests have been discontinued, your risk factors (such as having a new sexual partner) need to be reassessed to determine if screening should resume. Some women have medical problems that increase the chance of getting cervical cancer. In these cases, your health care provider may recommend more frequent screening and Pap tests. Colorectal Cancer  This type of cancer can be detected and often prevented.  Routine colorectal cancer screening usually  begins at 56 years of age and continues through 56 years of age.  Your health care provider may recommend screening at an earlier age if you have risk factors for colon cancer.  Your health care provider may also recommend using home test kits to check for hidden blood in the stool.  A small camera at the end of a tube can be used to examine your colon directly (sigmoidoscopy or colonoscopy). This is done to check for the earliest forms of colorectal cancer.  Routine screening usually begins at age 44.  Direct examination of the colon should be repeated every 5-10 years through 56 years of age. However, you may need to be screened more often if early forms of precancerous polyps or small growths are found. Skin Cancer  Check your skin from head to toe regularly.  Tell your health care provider about any new moles or changes in moles, especially if there is a change in a mole's shape or color.  Also tell your health care provider if you have a mole that is larger than the size of a pencil eraser.  Always use sunscreen. Apply sunscreen liberally and repeatedly throughout the day.  Protect yourself by wearing long sleeves, pants, a wide-brimmed hat, and sunglasses whenever you are outside. HEART DISEASE, DIABETES, AND HIGH BLOOD PRESSURE   High blood pressure causes heart disease and increases the risk of stroke. High blood pressure is more likely to develop in:  People who have blood pressure in the high end of the normal range (130-139/85-89 mm Hg).  People who are overweight or obese.  People who are African American.  If you are 62-70 years of age, have your blood pressure checked every 3-5 years. If you are 71 years of age or older, have your blood pressure checked every year. You should have your blood pressure measured twice--once when you are at a hospital or clinic, and once when you are not at a hospital or clinic. Record the average of the two measurements. To check your blood  pressure when you are not at a hospital or clinic, you can use:  An automated blood pressure machine at a pharmacy.  A home blood pressure monitor.  If you are between 36 years and 41 years old, ask your health care provider if you should take aspirin to prevent strokes.  Have regular diabetes screenings. This involves taking a blood sample to check your fasting blood sugar level.  If you are at a normal weight and have a low risk for diabetes, have this test once every three years after 56 years of age.  If you are overweight and have a high risk for diabetes, consider being tested at a younger age or more often. PREVENTING INFECTION  Hepatitis B  If you have a higher risk for hepatitis B, you should be screened for this virus. You are considered at high risk for hepatitis B if:  You were born in a country where hepatitis B is common. Ask your health care provider which countries are considered high risk.  Your parents were born in a high-risk country, and you have not been immunized against hepatitis B (hepatitis B vaccine).  You have HIV or AIDS.  You use needles to inject street drugs.  You live with someone who has hepatitis B.  You have had sex with someone who has hepatitis B.  You get hemodialysis treatment.  You take certain medicines for conditions, including cancer, organ transplantation, and autoimmune conditions. Hepatitis C  Blood testing is recommended for:  Everyone born from [birth year] through 1966.  Anyone with known risk factors for hepatitis C. Sexually transmitted infections (STIs)  You should be screened for sexually transmitted infections (STIs) including gonorrhea and chlamydia if:  You are sexually active and are younger than 56 years of age.  You are older than 56 years of age and your health care provider tells you that you are at risk for this type of infection.  Your sexual activity has changed since you were last screened and you are at an  increased risk for chlamydia or gonorrhea. Ask your health care provider if you are at risk.  If you do not have HIV, but are at risk, it may be recommended that you take a prescription medicine daily to prevent HIV infection. This is called pre-exposure prophylaxis (PrEP). You are considered at risk if:  You are sexually active and do not regularly use condoms or know the HIV status of your  partner(s).  You take drugs by injection.  You are sexually active with a partner who has HIV. Talk with your health care provider about whether you are at high risk of being infected with HIV. If you choose to begin PrEP, you should first be tested for HIV. You should then be tested every 3 months for as long as you are taking PrEP.  PREGNANCY   If you are premenopausal and you may become pregnant, ask your health care provider about preconception counseling.  If you may become pregnant, take 400 to 800 micrograms (mcg) of folic acid every day.  If you want to prevent pregnancy, talk to your health care provider about birth control (contraception). OSTEOPOROSIS AND MENOPAUSE   Osteoporosis is a disease in which the bones lose minerals and strength with aging. This can result in serious bone fractures. Your risk for osteoporosis can be identified using a bone density scan.  If you are 41 years of age or older, or if you are at risk for osteoporosis and fractures, ask your health care provider if you should be screened.  Ask your health care provider whether you should take a calcium or vitamin D supplement to lower your risk for osteoporosis.  Menopause may have certain physical symptoms and risks.  Hormone replacement therapy may reduce some of these symptoms and risks. Talk to your health care provider about whether hormone replacement therapy is right for you.  HOME CARE INSTRUCTIONS   Schedule regular health, dental, and eye exams.  Stay current with your immunizations.   Do not use any  tobacco products including cigarettes, chewing tobacco, or electronic cigarettes.  If you are pregnant, do not drink alcohol.  If you are breastfeeding, limit how much and how often you drink alcohol.  Limit alcohol intake to no more than 1 drink per day for nonpregnant women. One drink equals 12 ounces of beer, 5 ounces of wine, or 1 ounces of hard liquor.  Do not use street drugs.  Do not share needles.  Ask your health care provider for help if you need support or information about quitting drugs.  Tell your health care provider if you often feel depressed.  Tell your health care provider if you have ever been abused or do not feel safe at home.   This information is not intended to replace advice given to you by your health care provider. Make sure you discuss any questions you have with your health care provider.   Document Released: 04/28/2011 Document Revised: 11/03/2014 Document Reviewed: 09/14/2013 Elsevier Interactive Patient Education [employer].

## 2016-07-08 NOTE — Assessment & Plan Note (Signed)
Check vitamin D levels today. 

## 2016-07-08 NOTE — Assessment & Plan Note (Signed)
Recommend daily flonase. Add allergy drops for eyes to help with eye symptoms. Continue daily claritin.

## 2016-07-08 NOTE — Assessment & Plan Note (Signed)
Reduce Lisinopril to 5mg  once daily for low BP. Recheck BMP today. Recheck BP in 4 weeks.

## 2016-07-08 NOTE — Progress Notes (Signed)
Subjective:    Patient ID: Andrea Mcdonald, female    DOB: [birth year], 55 y.o.   MRN: [id number]  HPI: Andrea Mcdonald is a 55 y.o. female presenting on 07/08/2016 for Annual Exam (pt goes to [facility])   HPI  Pt presents for physical today. She is diabetic and due for follow-up. Eyes have been bothering her- itching, watering. Taking OTC allergy- has provided mild relief.  Hysterectomy many years ago for bleeding. Still has R ovary. No vaginal discharge or bleeding.  Due for mammogram this year. Last November 2015- normal.  Desires flu shot.  Diabetes- no numbness or tingling in feet. Had eye this year. Has not been checking sugars. Has been trying to watch what she eats. Is taking metformin and invokana. BP has been running low- 95/51 at home. Not dizzy. Is taking lisinopril 10mg .   Past Medical History:  Diagnosis Date  . Diabetes mellitus without complication ([place])   . High cholesterol   . Hypertension 05/25/2013  . Plantar fasciitis, bilateral 05/25/2013   Social History   Social History  . Marital status: Married    Spouse name: N/A  . Number of children: N/A  . Years of education: N/A   Occupational History  . Not on file.   Social History Main Topics  . Smoking status: Never Smoker  . Smokeless tobacco: Never Used  . Alcohol use Yes     Comment: drink occ  . Drug use: No  . Sexual activity: Yes   Other Topics Concern  . Not on file   Social History Narrative  . No narrative on file   Family History  Problem Relation Age of Onset  . Heart disease Mother   . Depression Mother   . Anxiety disorder Mother    Current Outpatient Prescriptions on File Prior to Visit  Medication Sig  . aspirin 81 MG chewable tablet Chew 81 mg by mouth at bedtime.  . canagliflozin (INVOKANA) 100 MG TABS tablet Take 1 tablet (100 mg total) by mouth daily before breakfast.  . ibuprofen (ADVIL,MOTRIN) 800 MG tablet Take 1 tablet (800 mg total) by mouth every 8  (eight) hours as needed.  . metFORMIN (GLUCOPHAGE) 1000 MG tablet Take 1 tablet (1,000 mg total) by mouth at bedtime.  Marland Kitchen omeprazole (PRILOSEC) 20 MG capsule Take 1 capsule (20 mg total) by mouth daily.  . rosuvastatin (CRESTOR) 20 MG tablet Take 1 tablet (20 mg total) by mouth at bedtime.  . sertraline (ZOLOFT) 100 MG tablet Take 1 tablet (100 mg total) by mouth at bedtime.   No current facility-administered medications on file prior to visit.     Review of Systems  Constitutional: Negative for chills and fever.  HENT: Positive for postnasal drip and rhinorrhea.        Ear congestion.   Eyes: Positive for itching.  Respiratory: Negative for cough, chest tightness and wheezing.   Cardiovascular: Negative for chest pain and leg swelling.  Gastrointestinal: Negative for abdominal pain, constipation, diarrhea, nausea and vomiting.  Endocrine: Negative.  Negative for cold intolerance, heat intolerance, polydipsia, polyphagia and polyuria.  Genitourinary: Negative for difficulty urinating and dysuria.  Musculoskeletal: Negative.   Neurological: Negative for dizziness, light-headedness and numbness.  Psychiatric/Behavioral: Negative.    Per HPI unless specifically indicated above     Objective:    BP (!) 95/51 (BP Location: Left Arm, Patient Position: Sitting, Cuff Size: Normal)   Pulse 70   Temp 97.9 F (36.6 C) (Oral)  Resp 16   Ht 5\' 5"  (1.651 m)   Wt 174 lb 9.6 oz (79.2 kg)   BMI 29.05 kg/m   Wt Readings from Last 3 Encounters:  07/08/16 174 lb 9.6 oz (79.2 kg)  02/12/16 181 lb 9.6 oz (82.4 kg)  12/11/15 183 lb (83 kg)    Physical Exam  Constitutional: She is oriented to person, place, and time. She appears well-developed and well-nourished.  HENT:  Head: Normocephalic and atraumatic.  Right Ear: A middle ear effusion is present.  Left Ear: A middle ear effusion is present.  Nose: Mucosal edema present. Right sinus exhibits no maxillary sinus tenderness and no frontal  sinus tenderness. Left sinus exhibits no maxillary sinus tenderness and no frontal sinus tenderness.  Eyes: Conjunctivae, EOM and lids are normal. Pupils are equal, round, and reactive to light.  Neck: Neck supple.  Cardiovascular: Normal rate, regular rhythm and normal heart sounds.  Exam reveals no gallop and no friction rub.   No murmur heard. Pulmonary/Chest: Effort normal and breath sounds normal. She has no wheezes. She exhibits no tenderness. Right breast exhibits no mass, no nipple discharge, no skin change and no tenderness. Left breast exhibits no mass, no nipple discharge, no skin change and no tenderness. Breasts are symmetrical.  Abdominal: Soft. Normal appearance and bowel sounds are normal. She exhibits no distension and no mass. There is no tenderness. There is no rebound and no guarding.  Musculoskeletal: Normal range of motion. She exhibits no edema or tenderness.  Lymphadenopathy:    She has no cervical adenopathy.  Neurological: She is alert and oriented to person, place, and time.  Skin: Skin is warm and dry.   Diabetic Foot Exam - Simple   Simple Foot Form Diabetic Foot exam was performed with the following findings:  Yes 07/08/2016  3:53 PM  Visual Inspection No deformities, no ulcerations, no other skin breakdown bilaterally:  Yes Sensation Testing Intact to touch and monofilament testing bilaterally:  Yes Pulse Check Posterior Tibialis and Dorsalis pulse intact bilaterally:  Yes Comments     Results for orders placed or performed in visit on 07/08/16  POCT UA - Microalbumin  Result Value Ref Range   Microalbumin Ur, POC 0 mg/L   Creatinine, POC  mg/dL   Albumin/Creatinine Ratio, Urine, POC        Assessment & Plan:   Problem List Items Addressed This Visit      Cardiovascular and Mediastinum   Essential hypertension    Reduce Lisinopril to 5mg  once daily for low BP. Recheck BMP today. Recheck BP in 4 weeks.       Relevant Medications   lisinopril  (PRINIVIL,ZESTRIL) 5 MG tablet     Respiratory   Allergic rhinitis    Recommend daily flonase. Add allergy drops for eyes to help with eye symptoms. Continue daily claritin.       Relevant Medications   azelastine (OPTIVAR) 0.05 % ophthalmic solution     Endocrine   Type 2 diabetes mellitus (HCC)    Recheck CMP and A1c with labs today. Continue metformin and invokana. Last A1c 6.9%. Encouraged diet and lifestyle changes UA micro-albumin in UTD. [facility]  Recheck A1c in 3 mos.        Relevant Medications   lisinopril (PRINIVIL,ZESTRIL) 5 MG tablet   Other Relevant Orders   POCT UA - Microalbumin (Completed)   COMPLETE METABOLIC PANEL WITH GFR   Lipid Profile  HgB A1c   POCT UA - Microalbumin     Other   Vitamin D deficiency   Relevant Orders   VITAMIN D 25 Hydroxy (Vit-D Deficiency, Fractures)    Other Visit Diagnoses    Well woman exam without gynecological exam    -  Primary   Need for influenza vaccination       Relevant Orders   Flu Vaccine QUAD 36+ mos PF IM (Fluarix & Fluzone Quad PF) (Completed)   Seasonal allergic rhinitis due to pollen       Relevant Medications   fluticasone (FLONASE) 50 MCG/ACT nasal spray   loratadine (CLARITIN) 10 MG tablet      Meds ordered this encounter  Medications  . fluticasone (FLONASE) 50 MCG/ACT nasal spray    Sig: Place 2 sprays into both nostrils daily.    Dispense:  16 g    Refill:  11    Order Specific Question:   Supervising Provider    Answer:   Arlis Porta [phone]  . azelastine (OPTIVAR) 0.05 % ophthalmic solution    Sig: Place 1 drop into both eyes 2 (two) times daily.    Dispense:  6 mL    Refill:  12    Order Specific Question:   Supervising Provider    Answer:   Arlis Porta [phone]  . loratadine (CLARITIN) 10 MG tablet    Sig: Take 1 tablet (10 mg total) by mouth daily.    Dispense:  30 tablet    Refill:  11    Order Specific Question:    Supervising Provider    Answer:   Arlis Porta [phone]  . lisinopril (PRINIVIL,ZESTRIL) 5 MG tablet    Sig: Take 1 tablet (5 mg total) by mouth daily.    Dispense:  90 tablet    Refill:  3    Order Specific Question:   Supervising Provider    Answer:   Arlis Porta [id number]      Follow up plan: Return in about 4 weeks (around 08/05/2016), or if symptoms worsen or fail to improve, for BP check. Marland Kitchen

## 2016-07-09 ENCOUNTER — Ambulatory Visit: Payer: Managed Care, Other (non HMO) | Admitting: Podiatry

## 2016-07-09 LAB — VITAMIN D 25 HYDROXY (VIT D DEFICIENCY, FRACTURES): VIT D 25 HYDROXY: 28 ng/mL — AB (ref 30–100)

## 2016-07-09 LAB — COMPLETE METABOLIC PANEL WITH GFR
ALT: 15 U/L (ref 6–29)
AST: 13 U/L (ref 10–35)
Albumin: 4.6 g/dL (ref 3.6–5.1)
Alkaline Phosphatase: 76 U/L (ref 33–130)
BILIRUBIN TOTAL: 0.9 mg/dL (ref 0.2–1.2)
BUN: 15 mg/dL (ref 7–25)
CALCIUM: 10 mg/dL (ref 8.6–10.4)
CHLORIDE: 104 mmol/L (ref 98–110)
CO2: 28 mmol/L (ref 20–31)
CREATININE: 0.9 mg/dL (ref 0.50–1.05)
GFR, EST AFRICAN AMERICAN: 83 mL/min (ref >=60)
GFR, Est Non African American: 72 mL/min (ref >=60)
Glucose, Bld: 144 mg/dL — ABNORMAL HIGH (ref 65–99)
Potassium: 4 mmol/L (ref 3.5–5.3)
Sodium: 140 mmol/L (ref 135–146)
Total Protein: 7 g/dL (ref 6.1–8.1)

## 2016-07-09 LAB — HEMOGLOBIN A1C
Hgb A1c MFr Bld: 8.1 % — ABNORMAL HIGH (ref <5.7)
Mean Plasma Glucose: 186 mg/dL

## 2016-07-09 LAB — LIPID PANEL
CHOLESTEROL: 138 mg/dL (ref 125–200)
HDL: 53 mg/dL (ref >=46)
LDL Cholesterol: 34 mg/dL (ref <130)
TRIGLYCERIDES: 256 mg/dL — AB (ref <150)
Total CHOL/HDL Ratio: 2.6 Ratio (ref <=5.0)
VLDL: 51 mg/dL — ABNORMAL HIGH (ref <30)

## 2016-07-10 ENCOUNTER — Telehealth: Payer: Self-pay | Admitting: Family Medicine

## 2016-07-10 MED ORDER — CANAGLIFLOZIN 300 MG PO TABS: 300.0000 mg | ORAL_TABLET | Freq: Every day | ORAL | 11 refills | Status: DC

## 2016-07-10 NOTE — Addendum Note (Signed)
Addended byVincenza Hews, Edna Rede L on: 07/10/2016 05:25 PM   Modules accepted: Orders

## 2016-07-10 NOTE — Telephone Encounter (Signed)
Called pt to review labs.

## 2016-07-10 NOTE — Telephone Encounter (Signed)
Pt wants lab result.

## 2016-08-05 ENCOUNTER — Ambulatory Visit: Payer: Managed Care, Other (non HMO) | Admitting: Family Medicine

## 2016-08-07 ENCOUNTER — Ambulatory Visit: Payer: Managed Care, Other (non HMO) | Admitting: Family Medicine

## 2016-09-26 ENCOUNTER — Other Ambulatory Visit: Payer: Self-pay | Admitting: Family Medicine

## 2016-09-26 ENCOUNTER — Ambulatory Visit
Admission: RE | Admit: 2016-09-26 | Discharge: 2016-09-26 | Disposition: A | Discharge status: Home / Self Care | Payer: Managed Care, Other (non HMO) | Source: Ambulatory Visit | Attending: Family Medicine | Admitting: Family Medicine

## 2016-09-26 ENCOUNTER — Ambulatory Visit: Payer: Managed Care, Other (non HMO)

## 2016-09-26 DIAGNOSIS — Z1231 Encounter for screening mammogram for malignant neoplasm of breast: Secondary | ICD-10-CM | POA: Diagnosis present

## 2016-09-26 DIAGNOSIS — Z0000 Encounter for general adult medical examination without abnormal findings: Secondary | ICD-10-CM

## 2016-09-26 DIAGNOSIS — Z1239 Encounter for other screening for malignant neoplasm of breast: Secondary | ICD-10-CM

## 2016-11-03 ENCOUNTER — Telehealth: Payer: Self-pay | Admitting: Family Medicine

## 2016-11-03 DIAGNOSIS — IMO0001 Reserved for inherently not codable concepts without codable children: Secondary | ICD-10-CM

## 2016-11-03 DIAGNOSIS — E1165 Type 2 diabetes mellitus with hyperglycemia: Principal | ICD-10-CM

## 2016-11-03 MED ORDER — DAPAGLIFLOZIN PROPANEDIOL 5 MG PO TABS: 5.0000 mg | ORAL_TABLET | Freq: Every day | ORAL | 2 refills | Status: DC

## 2016-11-03 NOTE — Telephone Encounter (Signed)
Left detailed message as stated below.[place]

## 2016-11-03 NOTE — Telephone Encounter (Signed)
Received refill request for Invokana recently, sent to pharmacy, however now received fax from her [place] stating that Prior Authorization for Anastasio Auerbach is required, however insurance preferred [place]. I think that this is a good choice, and have switched her to Dapagliflozin (generic for [place]) 5mg  daily in morning with or without food.  She is also due for a Diabetes follow-up for A1c at anytime. Given the new med change, I recommend that she follow-up with me for Diabetes in about 4 weeks, and we can re-check A1c and re-evaluate this new medication, may need higher dose.  -------------------------  Please notify patient the following: 1. Insurance does not cover Invokana. Change to preferred Farxiga 2. Stop Invokana and start new Wilder Glade, this was sent to pharmacy 3. Schedule Diabetes A1c follow-up with me in 4 weeks, discuss medications  Nobie Putnam, DO [facility] 11/03/2016, 12:42 PM

## 2016-12-02 ENCOUNTER — Encounter: Payer: Self-pay | Admitting: Family Medicine

## 2016-12-02 ENCOUNTER — Ambulatory Visit (INDEPENDENT_AMBULATORY_CARE_PROVIDER_SITE_OTHER): Payer: Managed Care, Other (non HMO) | Admitting: Family Medicine

## 2016-12-02 VITALS — BP 112/63 | HR 63 | Temp 98.1°F | Resp 16 | Ht 65.0 in | Wt 177.6 lb

## 2016-12-02 DIAGNOSIS — E785 Hyperlipidemia, unspecified: Secondary | ICD-10-CM

## 2016-12-02 DIAGNOSIS — E1169 Type 2 diabetes mellitus with other specified complication: Secondary | ICD-10-CM

## 2016-12-02 DIAGNOSIS — E559 Vitamin D deficiency, unspecified: Secondary | ICD-10-CM

## 2016-12-02 DIAGNOSIS — E119 Type 2 diabetes mellitus without complications: Secondary | ICD-10-CM | POA: Diagnosis not present

## 2016-12-02 LAB — POCT UA - MICROALBUMIN: MICROALBUMIN (UR) POC: 20 mg/L

## 2016-12-02 LAB — POCT GLYCOSYLATED HEMOGLOBIN (HGB A1C): HEMOGLOBIN A1C: 7.6

## 2016-12-02 MED ORDER — DAPAGLIFLOZIN PROPANEDIOL 5 MG PO TABS: 5.0000 mg | ORAL_TABLET | Freq: Every day | ORAL | 11 refills | Status: DC

## 2016-12-02 NOTE — Assessment & Plan Note (Signed)
Last vitamin D mildly low in insufficiency range, she did not take treatment dose Vitamin D  Plan: 1. Advised start Vitamin D3 5,000 iu daily OTC for 12 weeks, then lower dose to OTC vitamin D3 2,000 daily for maintenance 2. Future check Vitamin D at Annual Physical after 07/08/17

## 2016-12-02 NOTE — Patient Instructions (Signed)
Thank you for coming in to clinic today.  1. Good work on Diabetes control, A1c down from 8.1 down to 7.6 - Continue same plan with Metformin 1000mg  daily for now, call us 1 week before run out of Metformin and we can CHANGE to Metformin XR 850mg  daily, once in morning (less side effects on this) - Continue Farxiga 5mg  daily (switch back to morning), I don't think your symptoms were due to [place] most likely you had a stomach virus that explains your symptoms  2. Vitamin D - Last level was low at 28, 06/2016 - Start Vitamin D3 5,000 unit capsule once a day for 12 week (starting today) - then once finished, start Vitamin D3 2,000 unit capsule once a day for a long time as maintenance dose - We will check vitamin D in 06/2017  Will check on immunizations  Please schedule a follow-up appointment with Dr. Parks Ranger in 3 months for Diabetes A1c  If you have any other questions or concerns, please feel free to call the clinic or send a message through [place]. You may also schedule an earlier appointment if necessary.  Nobie Putnam, DO [place]

## 2016-12-02 NOTE — Assessment & Plan Note (Signed)
Stable on rosuvastatin 20mg  daily without myalgias, had elevated TG last check 06/2016  Plan: 1. Continue statin with Rosuvastatin 20mg  daily, can resume in evening 2. Future fasting lipids after 07/08/17 for Annual Physical

## 2016-12-02 NOTE — Assessment & Plan Note (Addendum)
Improved control, A1c 7.6 (from 8.1) No complications or hypoglycemia. - Mild microalbuminuria today  Plan:  1. Continue current therapy - Farxiga 5mg  daily and Metformin 1000mg  daily - advised patient we can try Metformin XR 850mg  daily in AM for less GI intolerance and better 24 hour coverage in future, she just received refill and will wait to notify us when rdy for new rx. Also discussed dose increase on Farxiga to 10mg , however mutual decision agree to hold for now, try aggressive lifestyle improvements, now going to gym first - Refilled Wilder Glade, gave patient manufacturer rx copay coupon card to bring to pharmacy 2. Encouraged continue lifestyle improvement regular exercise 3. Continue statin, ACEi 4. POC microalbumin today shows mild 20, already on ACEi 5. Follow-up with Ophtho for DM exam yearly 6. Checked NCIR UTD TDap for now, also received pneumonia vaccine < age 52, now wait til 53 for prevnar 13 7. Follow-up 3 months DM A1c

## 2016-12-02 NOTE — Progress Notes (Signed)
Subjective:    Patient ID: Andrea Mcdonald, female    DOB: [birth year], 56 y.o.   MRN: [id number]  Andrea Mcdonald is a 56 y.o. female presenting on 12/02/2016 for Diabetes (highest BS 220-300 and lowest 114)   HPI   CHRONIC DM, Type 2: Reports overall doing well. Prior diagnosis DM2 about 10 years ago. Here for A1c today. CBGs: Low 114, High 200-300. Checks CBGs about 3x weekly Meds: Metformin 1000mg  daily in morning (she has prior history of trial on Metformin 1000mg  BID when first diagnosed but had GI intolerance with diarrhea, has since done better on once daily) - Previously on Invokana 300mg  then not covered by insurance, switched to [place] 5mg  daily in 11/03/16, overall has done well on farxiga for 3 weeks until last week describes symptoms with upset stomach, nausea, abdominal cramping, and fevers/chills fatigue more viral syndrome, she wasn't sure if these were side effects of medicine and she switched dose to evening and did better, now symptoms resolved Currently on ACEi Lifestyle: Diet (tries to follow DM diet but not always) / Exercise (just started more regular exercise at gym now for past 1 month, goes about 4-5 days does treadmill and bike riding) Denies hypoglycemia, polyuria, visual changes, numbness or tingling.  HYPERLIPIDEMIA: - Reports no concerns. Last lipid panel 06/2016, controlled except abnormal TG - Currently taking Rosuvastatin 20mg  daily, tolerating well without side effects or myalgias  Vitamin D Deficiency - Prior history with Vitamin D deficiency last checked 02/2016 with 30.5 and then repeat lab with lower level at 28 in 06/2016, never took higher dose Vitamin D replacement, tried OTC Vitamin D 2,000 iu daily for 4 weeks  Health Maintenance: - UTD on TDap, last received 04/2011, next due 04/2021 - UTD on Pneumonia vaccine, received PPSV23, now due for Prevnar-13 at age 42, approx 08/2026 - Agrees to get future routine screening tests for Hep C and HIV  with future labs at next physical 05/2017  Social History  Substance Use Topics  . Smoking status: Never Smoker  . Smokeless tobacco: Never Used  . Alcohol use Yes     Comment: drink occ    Review of Systems Per HPI unless specifically indicated above     Objective:    BP 112/63   Pulse 63   Temp 98.1 F (36.7 C) (Oral)   Resp 16   Ht 5\' 5"  (1.651 m)   Wt 177 lb 9.6 oz (80.6 kg)   BMI 29.55 kg/m   Wt Readings from Last 3 Encounters:  12/02/16 177 lb 9.6 oz (80.6 kg)  07/08/16 174 lb 9.6 oz (79.2 kg)  02/12/16 181 lb 9.6 oz (82.4 kg)    Physical Exam  Constitutional: She appears well-developed and well-nourished. No distress.  Well-appearing, comfortable, cooperative  HENT:  Head: Normocephalic and atraumatic.  Mouth/Throat: Oropharynx is clear and moist.  Eyes: Conjunctivae are normal.  Cardiovascular: Normal rate, regular rhythm, normal heart sounds and intact distal pulses.   No murmur heard. Pulmonary/Chest: Effort normal and breath sounds normal. No respiratory distress. She has no wheezes. She has no rales.  Musculoskeletal: She exhibits no edema.  Neurological: She is alert.  Skin: Skin is warm and dry. She is not diaphoretic.  Psychiatric: Her behavior is normal.  Nursing note and vitals reviewed.   I have personally reviewed the following lab results from 12/02/2016.  Results for orders placed or performed in visit on 12/02/16  POCT HgB A1C  Result Value Ref Range  Hemoglobin A1C 7.6   POCT UA - Microalbumin  Result Value Ref Range   Microalbumin Ur, POC 20 mg/L   Creatinine, POC  mg/dL   Albumin/Creatinine Ratio, Urine, POC        Assessment & Plan:   Problem List Items Addressed This Visit    Vitamin D deficiency    Last vitamin D mildly low in insufficiency range, she did not take treatment dose Vitamin D  Plan: 1. Advised start Vitamin D3 5,000 iu daily OTC for 12 weeks, then lower dose to OTC vitamin D3 2,000 daily for maintenance 2.  Future check Vitamin D at Annual Physical after 07/08/17      Type 2 diabetes mellitus (HCC) - Primary    Improved control, A1c 7.6 (from 8.1) No complications or hypoglycemia. - Mild microalbuminuria today  Plan:  1. Continue current therapy - Farxiga 5mg  daily and Metformin 1000mg  daily - advised patient we can try Metformin XR 850mg  daily in AM for less GI intolerance and better 24 hour coverage in future, she just received refill and will wait to notify us when rdy for new rx. Also discussed dose increase on Farxiga to 10mg , however mutual decision agree to hold for now, try aggressive lifestyle improvements, now going to gym first - Refilled Wilder Glade, gave patient manufacturer rx copay coupon card to bring to pharmacy 2. Encouraged continue lifestyle improvement regular exercise 3. Continue statin, ACEi 4. POC microalbumin today shows mild 20, already on ACEi 5. Follow-up with Ophtho for DM exam yearly 6. Checked NCIR UTD TDap for now, also received pneumonia vaccine < age 23, now wait til 61 for prevnar 13 7. Follow-up 3 months DM A1c      Relevant Medications   dapagliflozin propanediol (FARXIGA) 5 MG TABS tablet   Other Relevant Orders   POCT HgB A1C (Completed)   POCT UA - Microalbumin (Completed)   Hyperlipidemia associated with type 2 diabetes mellitus ([place])    Stable on rosuvastatin 20mg  daily without myalgias, had elevated TG last check 06/2016  Plan: 1. Continue statin with Rosuvastatin 20mg  daily, can resume in evening 2. Future fasting lipids after 07/08/17 for Annual Physical      Relevant Medications   dapagliflozin propanediol (FARXIGA) 5 MG TABS tablet      Meds ordered this encounter  Medications  . dapagliflozin propanediol (FARXIGA) 5 MG TABS tablet    Sig: Take 5 mg by mouth daily.    Dispense:  30 tablet    Refill:  11      Follow up plan: Return in about 3 months (around 03/01/2017) for diabetes.  Nobie Putnam, [facility] 12/02/2016, 7:56 PM

## 2016-12-30 IMAGING — CR DG ABDOMEN 2V
1 series · 3 of 3 positions shown · non-contrast
Comparison: CT abdomen pelvis of 10/29/2014

CLINICAL DATA: Abdominal pain for 4 days, history of kidney stones

EXAM:
ABDOMEN - 2 VIEW

[Series 1: dg abd 2 views · 0.14mm/px · 3 of 3 slices shown]
[im 1/3]
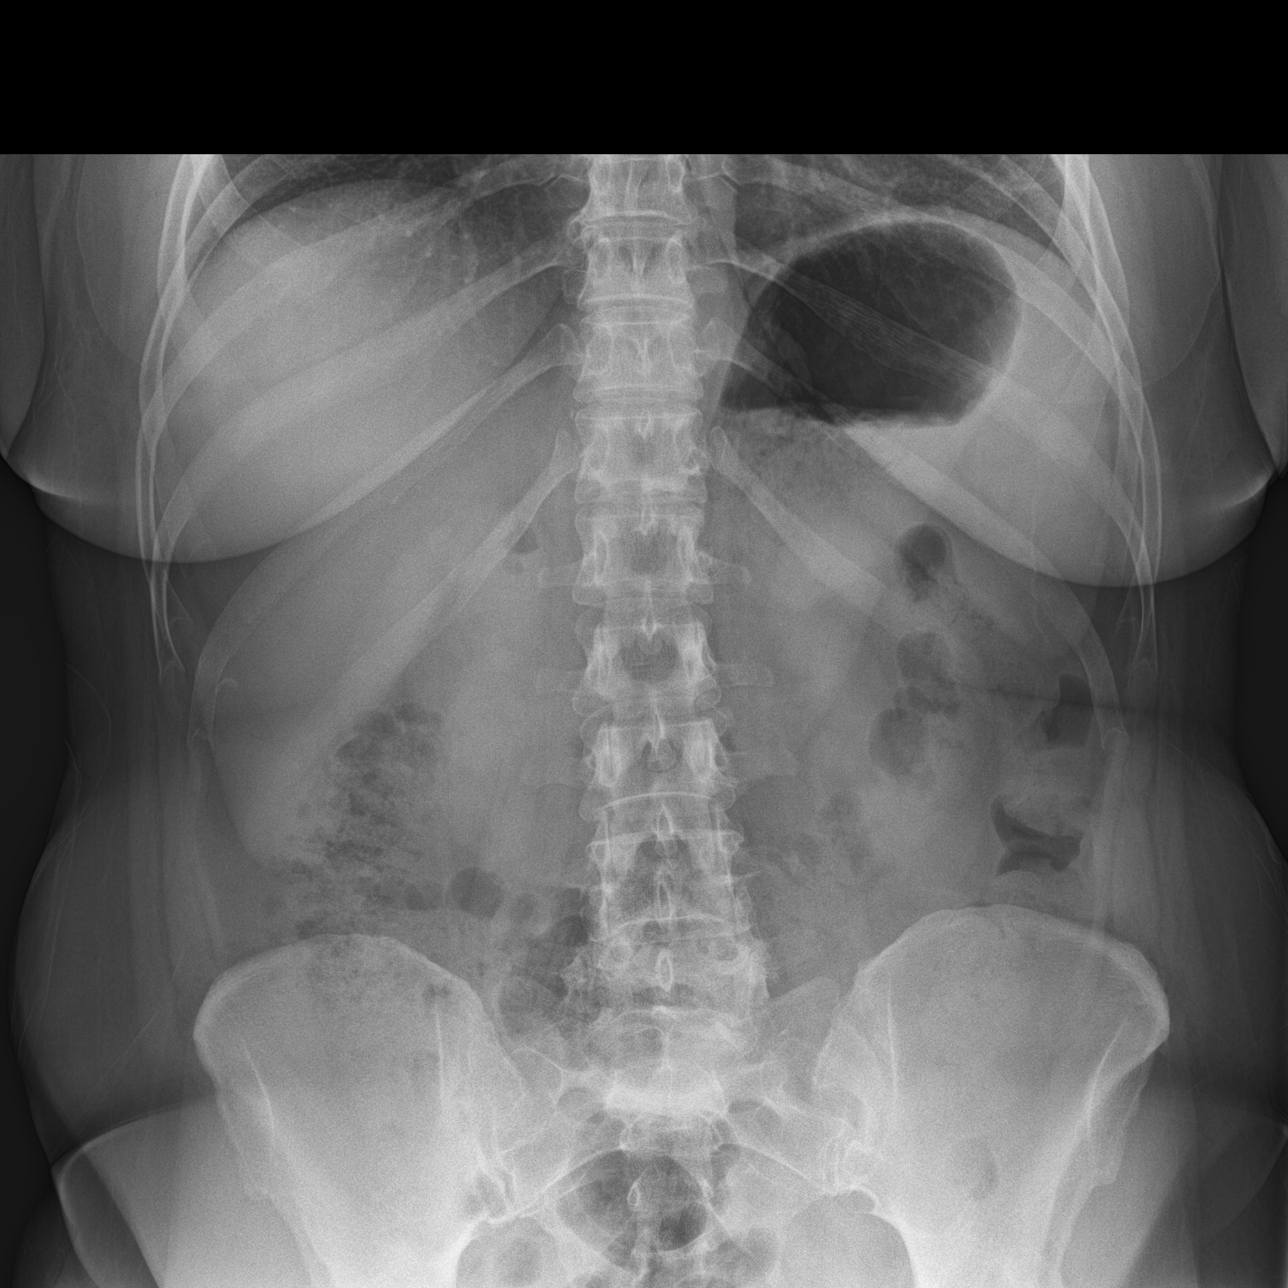
[im 2/3]
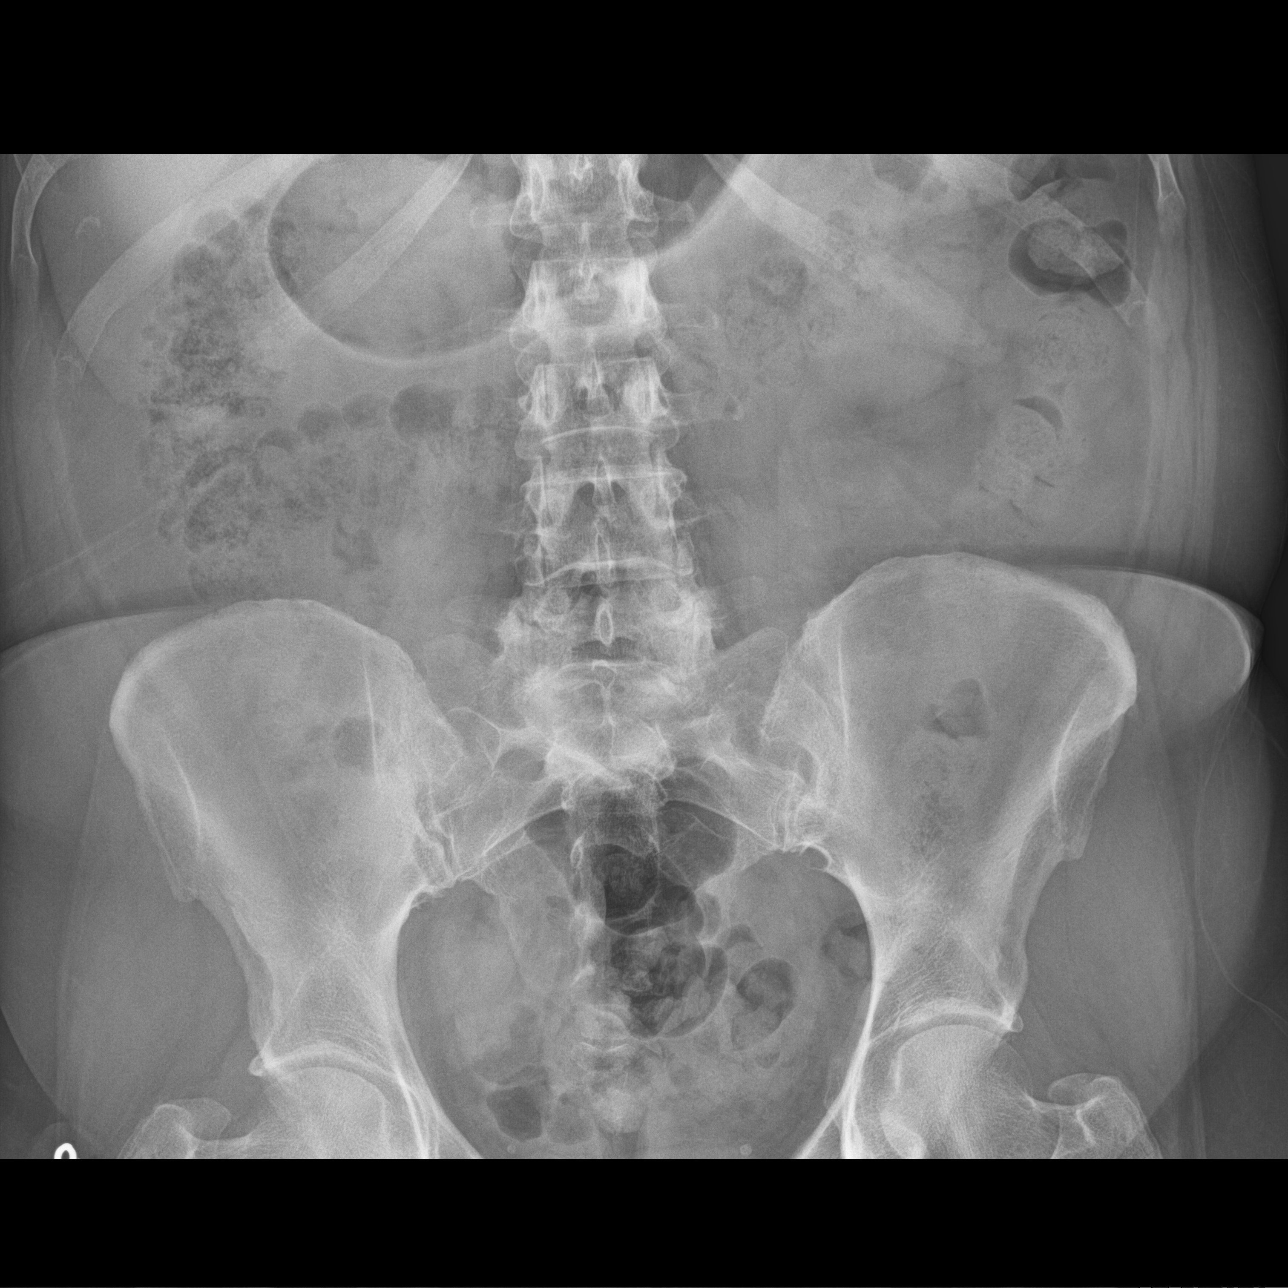
[im 3/3]
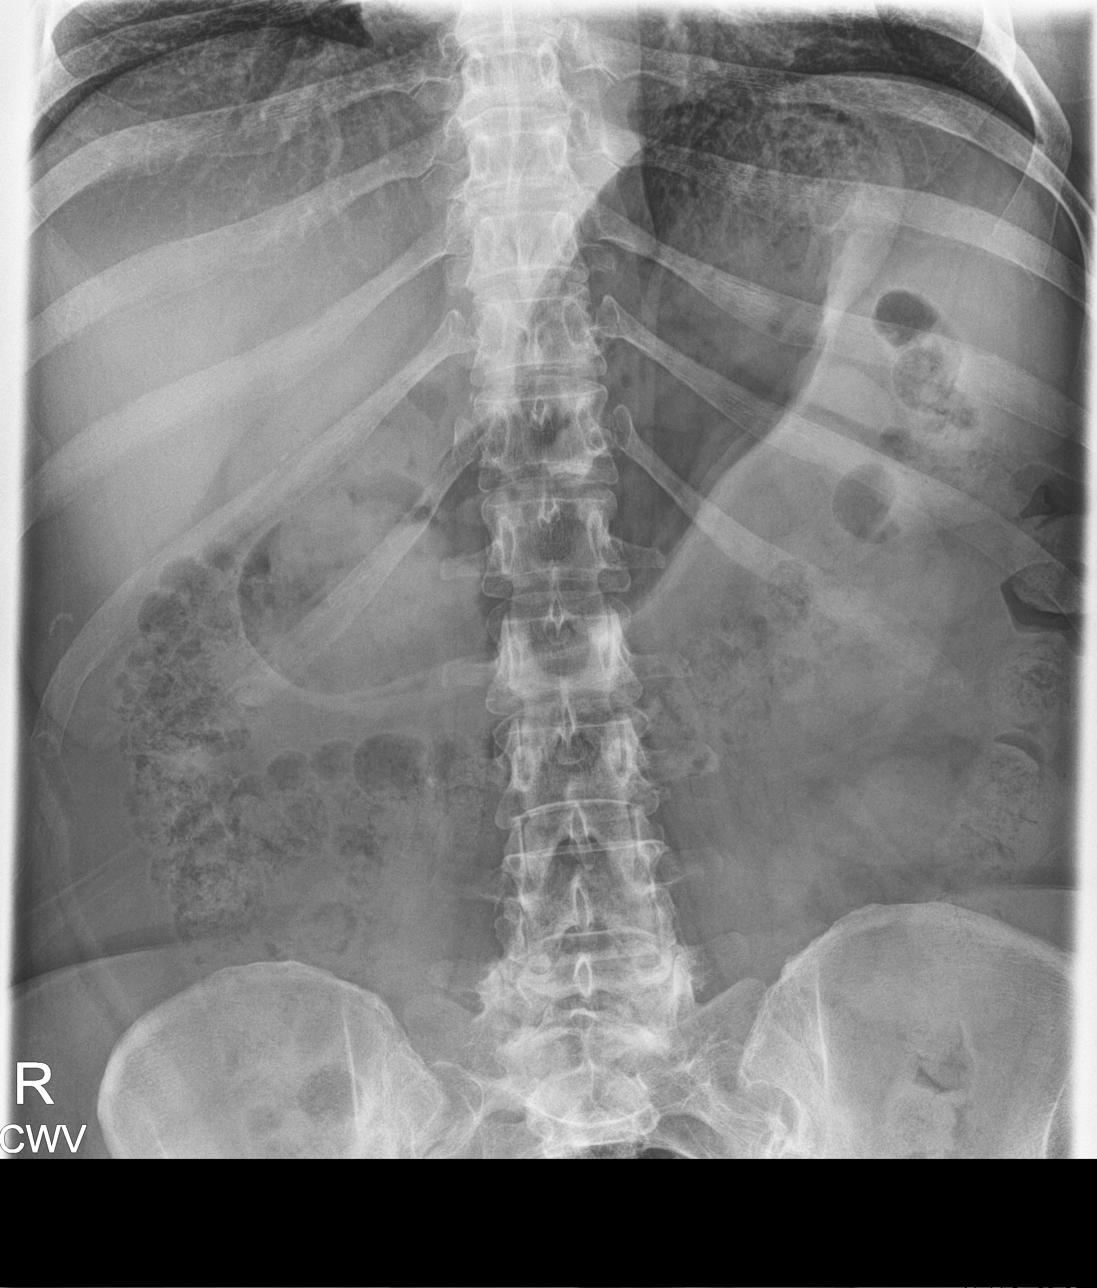

[3 of 3 positions shown; findings below may reference images not displayed]

FINDINGS: By plain film, no definite renal or ureteral calculi are noted. The
bowel gas pattern is nonspecific. A moderate amount of feces is
noted throughout the colon. There are degenerative changes at the
L5-S1 level.
IMPRESSION: No definite renal or ureteral calculi are seen by plain film.

## 2017-02-20 ENCOUNTER — Other Ambulatory Visit: Payer: Self-pay

## 2017-02-20 DIAGNOSIS — F32A Depression, unspecified: Secondary | ICD-10-CM

## 2017-02-20 DIAGNOSIS — F329 Major depressive disorder, single episode, unspecified: Secondary | ICD-10-CM

## 2017-02-20 MED ORDER — SERTRALINE HCL 100 MG PO TABS: 100.0000 mg | ORAL_TABLET | Freq: Every day | ORAL | 11 refills | Status: DC

## 2017-02-20 NOTE — Telephone Encounter (Signed)
Last ov 12/02/16 Last filled 02/12/16 Please review. Thank you. sd

## 2017-03-13 ENCOUNTER — Ambulatory Visit: Payer: Managed Care, Other (non HMO) | Admitting: Family Medicine

## 2017-03-16 ENCOUNTER — Ambulatory Visit (INDEPENDENT_AMBULATORY_CARE_PROVIDER_SITE_OTHER): Payer: Managed Care, Other (non HMO) | Admitting: Family Medicine

## 2017-03-16 ENCOUNTER — Encounter: Payer: Self-pay | Admitting: Family Medicine

## 2017-03-16 VITALS — BP 102/57 | HR 67 | Temp 98.3°F | Ht 66.0 in | Wt 177.8 lb

## 2017-03-16 DIAGNOSIS — F418 Other specified anxiety disorders: Secondary | ICD-10-CM | POA: Diagnosis not present

## 2017-03-16 DIAGNOSIS — N182 Chronic kidney disease, stage 2 (mild): Secondary | ICD-10-CM

## 2017-03-16 DIAGNOSIS — K591 Functional diarrhea: Secondary | ICD-10-CM

## 2017-03-16 DIAGNOSIS — Z794 Long term (current) use of insulin: Secondary | ICD-10-CM

## 2017-03-16 DIAGNOSIS — E1122 Type 2 diabetes mellitus with diabetic chronic kidney disease: Secondary | ICD-10-CM | POA: Diagnosis not present

## 2017-03-16 DIAGNOSIS — E119 Type 2 diabetes mellitus without complications: Secondary | ICD-10-CM | POA: Diagnosis not present

## 2017-03-16 LAB — POCT GLYCOSYLATED HEMOGLOBIN (HGB A1C): HEMOGLOBIN A1C: 7.3 — AB (ref ?–5.6)

## 2017-03-16 MED ORDER — METFORMIN HCL ER (MOD) 1000 MG PO TB24: 1000.0000 mg | ORAL_TABLET | Freq: Every day | ORAL | 11 refills | Status: DC

## 2017-03-16 NOTE — Patient Instructions (Addendum)
Thank you for coming to the clinic today.  1. Changed Metformin to 1000mg  daily 24 hour tablet - once in morning, sent rx  2. Continue Farxiga no change today, A1c 7.3 (improved from 7.6)  3. For anxiety, increase Sertraline (Zoloft) from 100mg  (1 pill) daily to ONE and HALF pills daily for 150mg  - you will run out sooner, so call office in 1 week, if you think it is helping and not side effects, then can increase rx for #45 pills per month  If not helping or side effects, then reduce dose back to 100mg  daily just ONE pill daily, and let me know.  IF ADVISED TO TAPER OFF OF SERTRALINE ZOLOFT, FOLLOW THESE INSTRUCTIONS:  If at 150mg  daily for >1 week, then go ahead and reduce to 100mg  daily for 3 days, then the following:  Decrease dose of 100mg  to HALF tablet to dose of 50mg  daily for 1 week  Take HALF tablet for dose of 50mg  every OTHER day for 1 week, then STOP  After about 2 weeks of tapering dose, you should start NEW medication, Escitalopram (Lexapro)  In future if needed, can add alternative anxiety medication such as Buspar (Buspirone) this is often a twice a day medication.  ---------------------  Start Metamucil or daily fiber supplement ONCE daily or increase dietary fiber to help regulate bowels   Please schedule a follow-up appointment with Dr. Parks Ranger in 4-6 weeks for Anxiety/GAD7, meds  Will schedule apt for September Annual Physical / Labs NEXT TIME  If you have any other questions or concerns, please feel free to call the clinic or send a message through [facility]. You may also schedule an earlier appointment if necessary.  Nobie Putnam, DO [place]

## 2017-03-16 NOTE — Assessment & Plan Note (Signed)
Suspect multifactorial problem, given history and timeline seems to be most indicative of anxiety/stress related, possible IBS. Having some regular BMs so unlikely constipation / encopresis related as she has no fecal incontinence or urge problems. No infectious symptoms. Considered medication side effect GI intolerance but has been stable on current daily dose Metformin for while now, somewhat similar timeline with SGLT2 but seems unlikely without urinary symptoms - Last colonoscopy normal within past 5 years, by chart 2014  Plan: 1. Reassurance, suspect more functional etiology of diarrhea 2. Recommend try to help regular bowels with inc fiber intake, can do Metamucil fiber supplement daily, good hydration 3. Treat underlying anxiety and try to control stress 4. Follow-up as needed, if refractory then can consider referral to GI

## 2017-03-16 NOTE — Assessment & Plan Note (Signed)
Still improving control, A1c 7.3 (from 7.6) No complications or hypoglycemia. - Last urine with mild microalbuminuria but on ACEi  Plan:  1. Change Metformin from 1000mg  IR to 1000mg  XR daily, may lessen any GI intolerance if still present 2. Continue current SGLT2 Farxiga 5mg  nightly - not increase dose today 3. Encouraged continue lifestyle improvement regular exercise 4. Continue statin, ACEi 5. Follow-up with Ophtho for DM exam yearly 6. Follow-up 4 months Annual Physical - labs + A1c

## 2017-03-16 NOTE — Assessment & Plan Note (Signed)
Suspected dx GAD now with gradual worsening with acute life stressors (primarily work related) causing more difficulty functioning, previously coped well, now affecting physically and some fatigue/poor sleep -GAD7: 13, difficult (inc from 7) / PHQ2: 1 - Failed Meds: Wellbutrin - No prior Psych / counseling  Plan: 1. Discussion on anxiety management, complications 2. Reviewed all options including switch SSRI likely to Lexapro, add adjunct Buspar, vs increase dose Sertraline - Agree to start with trial inc dose Sertraline from 100mg  daily to 150mg  daily (1.5 tabs), exhaust this option first since stable on current dose and tolerating well. Trial for 1-2 weeks, if improved can notify office and request refill inc # pills Sertraline - If NOT improved within 1 week, or interested to switch SSRI, will await to hear from patient then proceed with Sertraline tapering instructions (see AVS, 100mg  x 3 days, then 50mg  x 1 week, then 50mg  QOD 1 week then stop), at that time will send in Escitalopram 10mg  daily, counseling 4-6 weeks until effect, similar side effect profile - In future consider adjunct Buspar therapy for likely GAD 3. Advised recommend therapy / counseling in future - will givenhandout locations/names if need in future for self referral info 4. Follow-up 6 weeks anxiety, med adjust, GAD7/PHQ9

## 2017-03-16 NOTE — Progress Notes (Signed)
Subjective:    Patient ID: Andrea Mcdonald, female    DOB: 03-Nov-1960, 56 y.o.   MRN: [id number]  Andrea Mcdonald is a 56 y.o. female presenting on 03/16/2017 for Diabetes   HPI   CHRONIC DM, Type 2: Reports feels like she is doing better now on current regimen. CBGs are improving. Unable to start invokana due to lack of ins coverage, but had no problem with Wilder Glade and applied coupon card with coverage and no copay. CBGs: Avg 130-140. Low 114, High < 200. Checks CBGs about 3x weekly Meds: - Farxiga 5mg  nightly - Metformin 1000mg  daily in morning (GI intolerance on BID) - now today interested to switch to XR Currently on ACEi Lifestyle: - Diet (some improved DM diet, still not following 100%) - Exercise (continue regular exercise at gym, about 4-5 days does treadmill and bike riding) Denies hypoglycemia, polyuria, visual changes, numbness or tingling.  Chronic Ear Wax: - Reports problems with ear wax in past due to working in [employer] and loud noises, wears ear plugs at work sometimes has had inc wax build-up and problems hearing. - Denies ear pain or pressure, requests ear check  Diarrhea, Chronic - Reports new chronic complaint for past 2-3 months, often has episodes of diarrhea sometimes liquid or watery stool immediately following eating. No particular trigger or illness. Seems to be worse with anxiety and stress, often has diarrhea BMs at work. Does seem to worsen if eats spicy foods or other foods. Admits low fiber diet. Prior history of GERD. No more heartburn, off PPI - Had Colonoscopy after age 71, chart review says 60 (aprox age 46), donelocally, was told that this was normal, and that next due in 10 years - Still has regular formed BMs on most days - Denies constipation, nausea, vomiting, abdominal pain, unintentional wt loss, rectal bleeding  Anxiety, with history of depression: - Reports primary concern today about recent worsening anxiety over past few  months, attributes to various stressors, but mostly at work with increased stress. Additionally her daughter is going through a divorce. She describes overall improved symptoms on SSRI now for while now. - Admits stress at work with boss, often she will have jaw clenching response due to stress an can "feel" very anxious - Taking Sertraline (Zoloft) 100mg  daily with good results, but asking if dose change needed - See PHQ, GAD7 - Denies any homicidal or suicidal ideation  Depression screen [facility] 2/9 03/16/2017 02/12/2016 11/07/2015  Decreased Interest 0 0 0  Down, Depressed, Hopeless 1 0 0  PHQ - 2 Score 1 0 0   GAD 7 : Generalized Anxiety Score 03/16/2017 02/12/2016  Nervous, Anxious, on Edge 3 1  Control/stop worrying 3 2  Worry too much - different things 1 1  Trouble relaxing 1 3  Restless 1 0  Easily annoyed or irritable 3 0  Afraid - awful might happen 1 0  Total GAD 7 Score 13 7  Anxiety Difficulty Somewhat difficult Somewhat difficult    Social History  Substance Use Topics  . Smoking status: Never Smoker  . Smokeless tobacco: Never Used  . Alcohol use Yes     Comment: drink occ    Review of Systems Per HPI unless specifically indicated above     Objective:    BP (!) 102/57   Pulse 67   Temp 98.3 F (36.8 C) (Oral)   Ht 5\' 6"  (1.676 m)   Wt 177 lb 12.8 oz (80.6 kg)   BMI  28.70 kg/m   Wt Readings from Last 3 Encounters:  03/16/17 177 lb 12.8 oz (80.6 kg)  12/02/16 177 lb 9.6 oz (80.6 kg)  07/08/16 174 lb 9.6 oz (79.2 kg)    Physical Exam  Constitutional: She is oriented to person, place, and time. She appears well-developed and well-nourished. No distress.  Well-appearing, comfortable, cooperative  HENT:  Head: Normocephalic and atraumatic.  Mouth/Throat: Oropharynx is clear and moist.  Eyes: Conjunctivae are normal.  Neck: Normal range of motion. Neck supple.  Cardiovascular: Normal rate, regular rhythm, normal heart sounds and intact distal pulses.   No  murmur heard. Pulmonary/Chest: Effort normal and breath sounds normal. No respiratory distress. She has no wheezes. She has no rales.  Abdominal: Soft. Bowel sounds are normal. She exhibits no distension and no mass. There is no tenderness. There is no rebound.  Musculoskeletal: She exhibits no edema.  Neurological: She is alert and oriented to person, place, and time.  Skin: Skin is warm and dry. She is not diaphoretic.  Psychiatric: She has a normal mood and affect. Her behavior is normal.  Well groomed, good eye contact, normal speech and thoughts. Appears mildly anxious.  Nursing note and vitals reviewed.   Results for orders placed or performed in visit on 03/16/17  POCT HgB A1C  Result Value Ref Range   Hemoglobin A1C 7.3 (A) 5.6      Assessment & Plan:   Problem List Items Addressed This Visit    Type 2 diabetes mellitus ([place])    Still improving control, A1c 7.3 (from 7.6) No complications or hypoglycemia. - Last urine with mild microalbuminuria but on ACEi  Plan:  1. Change Metformin from 1000mg  IR to 1000mg  XR daily, may lessen any GI intolerance if still present 2. Continue current SGLT2 Farxiga 5mg  nightly - not increase dose today 3. Encouraged continue lifestyle improvement regular exercise 4. Continue statin, ACEi 5. Follow-up with Ophtho for DM exam yearly 6. Follow-up 4 months Annual Physical - labs + A1c      Relevant Medications   metFORMIN (GLUMETZA) 1000 MG (MOD) 24 hr tablet   Other Relevant Orders   POCT HgB A1C (Completed)   Diarrhea, functional    Suspect multifactorial problem, given history and timeline seems to be most indicative of anxiety/stress related, possible IBS. Having some regular BMs so unlikely constipation / encopresis related as she has no fecal incontinence or urge problems. No infectious symptoms. Considered medication side effect GI intolerance but has been stable on current daily dose Metformin for while now, somewhat similar timeline  with SGLT2 but seems unlikely without urinary symptoms - Last colonoscopy normal within past 5 years, by chart 2014  Plan: 1. Reassurance, suspect more functional etiology of diarrhea 2. Recommend try to help regular bowels with inc fiber intake, can do Metamucil fiber supplement daily, good hydration 3. Treat underlying anxiety and try to control stress 4. Follow-up as needed, if refractory then can consider referral to GI      Anxiety associated with depression - Primary    Suspected dx GAD now with gradual worsening with acute life stressors (primarily work related) causing more difficulty functioning, previously coped well, now affecting physically and some fatigue/poor sleep -GAD7: 13, difficult (inc from 7) / PHQ2: 1 - Failed Meds: Wellbutrin - No prior Psych / counseling  Plan: 1. Discussion on anxiety management, complications 2. Reviewed all options including switch SSRI likely to Lexapro, add adjunct Buspar, vs increase dose Sertraline - Agree to start with  trial inc dose Sertraline from 100mg  daily to 150mg  daily (1.5 tabs), exhaust this option first since stable on current dose and tolerating well. Trial for 1-2 weeks, if improved can notify office and request refill inc # pills Sertraline - If NOT improved within 1 week, or interested to switch SSRI, will await to hear from patient then proceed with Sertraline tapering instructions (see AVS, 100mg  x 3 days, then 50mg  x 1 week, then 50mg  QOD 1 week then stop), at that time will send in Escitalopram 10mg  daily, counseling 4-6 weeks until effect, similar side effect profile - In future consider adjunct Buspar therapy for likely GAD 3. Advised recommend therapy / counseling in future - will givenhandout locations/names if need in future for self referral info 4. Follow-up 6 weeks anxiety, med adjust, GAD7/PHQ9         Meds ordered this encounter  Medications  . metFORMIN (GLUMETZA) 1000 MG (MOD) 24 hr tablet    Sig: Take 1  tablet (1,000 mg total) by mouth daily with breakfast.    Dispense:  30 tablet    Refill:  11    Follow up plan: Return in about 6 weeks (around 04/27/2017) for Anxiety/GAD7, meds.  Will plan on upcoming Annual Physical 06/2017 and future fasting labs to be ordered.  Nobie Putnam, [facility] 03/16/2017, 9:48 PM

## 2017-03-19 ENCOUNTER — Other Ambulatory Visit: Payer: Self-pay | Admitting: Family Medicine

## 2017-03-19 DIAGNOSIS — E119 Type 2 diabetes mellitus without complications: Secondary | ICD-10-CM

## 2017-03-19 MED ORDER — METFORMIN HCL ER 500 MG PO TB24: 1000.0000 mg | ORAL_TABLET | Freq: Every day | ORAL | 11 refills | Status: DC

## 2017-03-27 ENCOUNTER — Telehealth: Payer: Self-pay | Admitting: Family Medicine

## 2017-03-27 DIAGNOSIS — F329 Major depressive disorder, single episode, unspecified: Secondary | ICD-10-CM

## 2017-03-27 DIAGNOSIS — F32A Depression, unspecified: Secondary | ICD-10-CM

## 2017-03-27 MED ORDER — SERTRALINE HCL 100 MG PO TABS: 150.0000 mg | ORAL_TABLET | Freq: Every day | ORAL | 11 refills | Status: DC

## 2017-03-27 NOTE — Telephone Encounter (Signed)
Pt  Called states that  Zoloft was working for her have requested that you call her in a refill. Pt. Call back [phone]

## 2017-03-27 NOTE — Telephone Encounter (Signed)
Recently on 5/21 increased dose Zoloft from 100mg  nightly to 150mg  nightly (1.5 tabs of 100mg ), sent new refill still 100mg  tabs, take 1.5, #45 tabs for 1 month supply with +11 refills to [facility] 03/27/2017, 12:38 PM

## 2017-04-08 ENCOUNTER — Other Ambulatory Visit: Payer: Self-pay

## 2017-04-08 DIAGNOSIS — E785 Hyperlipidemia, unspecified: Secondary | ICD-10-CM

## 2017-04-08 MED ORDER — ROSUVASTATIN CALCIUM 20 MG PO TABS: 20.0000 mg | ORAL_TABLET | Freq: Every day | ORAL | 3 refills | Status: DC

## 2017-04-22 ENCOUNTER — Ambulatory Visit (INDEPENDENT_AMBULATORY_CARE_PROVIDER_SITE_OTHER): Payer: Managed Care, Other (non HMO) | Admitting: Family Medicine

## 2017-04-22 ENCOUNTER — Other Ambulatory Visit: Payer: Self-pay | Admitting: Family Medicine

## 2017-04-22 ENCOUNTER — Encounter: Payer: Self-pay | Admitting: Family Medicine

## 2017-04-22 VITALS — BP 109/59 | HR 67 | Temp 98.4°F | Ht 66.0 in | Wt 176.0 lb

## 2017-04-22 DIAGNOSIS — R350 Frequency of micturition: Secondary | ICD-10-CM

## 2017-04-22 DIAGNOSIS — Z1159 Encounter for screening for other viral diseases: Secondary | ICD-10-CM

## 2017-04-22 DIAGNOSIS — E1169 Type 2 diabetes mellitus with other specified complication: Secondary | ICD-10-CM

## 2017-04-22 DIAGNOSIS — I10 Essential (primary) hypertension: Secondary | ICD-10-CM

## 2017-04-22 DIAGNOSIS — E785 Hyperlipidemia, unspecified: Secondary | ICD-10-CM

## 2017-04-22 DIAGNOSIS — Z0000 Encounter for general adult medical examination without abnormal findings: Secondary | ICD-10-CM

## 2017-04-22 DIAGNOSIS — Z114 Encounter for screening for human immunodeficiency virus [HIV]: Secondary | ICD-10-CM

## 2017-04-22 DIAGNOSIS — R799 Abnormal finding of blood chemistry, unspecified: Secondary | ICD-10-CM

## 2017-04-22 DIAGNOSIS — F418 Other specified anxiety disorders: Secondary | ICD-10-CM | POA: Diagnosis not present

## 2017-04-22 DIAGNOSIS — E119 Type 2 diabetes mellitus without complications: Secondary | ICD-10-CM

## 2017-04-22 LAB — POCT URINALYSIS DIPSTICK
Bilirubin, UA: NEGATIVE
GLUCOSE UA: 1000
Ketones, UA: NEGATIVE
Leukocytes, UA: NEGATIVE
NITRITE UA: NEGATIVE
PROTEIN UA: NEGATIVE
RBC UA: NEGATIVE
Spec Grav, UA: 1.025 (ref 1.010–1.025)
UROBILINOGEN UA: 0.2 U/dL
pH, UA: 5 (ref 5.0–8.0)

## 2017-04-22 NOTE — Progress Notes (Signed)
Subjective:    Patient ID: Andrea Mcdonald, female    DOB: 1960-12-10, 56 y.o.   MRN: [id number]  Andrea Mcdonald is a 56 y.o. female presenting on 04/22/2017 for Anxiety and Urinary Frequency (pt thinks its possible related to her diabetes )   HPI   URINARY FREQUENCY, in setting of DM2 - Last visit 02/2017 for DM follow-up, review note for background information. - Today new concern with urinary frequency, concern for related to diabetes vs UTI. She denies significant symptoms of UTI, not having pain or discomfort with voiding. - Taking Farxiga 5mg  daily for past 4-5 months for DM, urinary frequency more recent onset but still seems may be related - Admits poor hydration, drinks coffee daily in AM, mostly tea and diet soda, limited water intake - Denies any fevers/chills, dysuria, hematuria, nausea, vomiting, flank pain  Follow-up Anxiety, with history of depression: - last visit 5/21, she was increased on Sertraline 100 to 150mg . Considered Lexapro but decided against new med due to concern with such a switch. - Today she thinks that she overall feels better, hard to describe how, work stress is better but still has other family worries and other stressors impacting her function - taking sertraline 100mg  x 1.5 pills nightly, will prefer to try longer - See PHQ, GAD7 - Denies any homicidal or suicidal ideation  Depression screen [facility] 2/9 04/22/2017 03/16/2017 02/12/2016  Decreased Interest 1 0 0  Down, Depressed, Hopeless 1 1 0  PHQ - 2 Score 2 1 0  Altered sleeping 1 - -  Tired, decreased energy 3 - -  Change in appetite 1 - -  Feeling bad or failure about yourself  0 - -  Trouble concentrating 1 - -  Moving slowly or fidgety/restless 0 - -  Suicidal thoughts 0 - -  PHQ-9 Score 8 - -  Difficult doing work/chores Somewhat difficult - -   GAD 7 : Generalized Anxiety Score 04/22/2017 03/16/2017 02/12/2016  Nervous, Anxious, on Edge 2 3 1   Control/stop worrying 3 3 2   Worry too  much - different things 3 1 1   Trouble relaxing 2 1 3   Restless 1 1 0  Easily annoyed or irritable 2 3 0  Afraid - awful might happen 3 1 0  Total GAD 7 Score 16 13 7   Anxiety Difficulty Somewhat difficult Somewhat difficult Somewhat difficult    Social History  Substance Use Topics  . Smoking status: Never Smoker  . Smokeless tobacco: Never Used  . Alcohol use Yes     Comment: drink occ    Review of Systems  Genitourinary: Positive for frequency.   Per HPI unless specifically indicated above     Objective:    BP (!) 109/59 (BP Location: Right Arm, Patient Position: Sitting, Cuff Size: Normal)   Pulse 67   Temp 98.4 F (36.9 C) (Oral)   Ht 5\' 6"  (1.676 m)   Wt 176 lb (79.8 kg)   BMI 28.41 kg/m   Wt Readings from Last 3 Encounters:  04/22/17 176 lb (79.8 kg)  03/16/17 177 lb 12.8 oz (80.6 kg)  12/02/16 177 lb 9.6 oz (80.6 kg)    Physical Exam  Constitutional: She is oriented to person, place, and time. She appears well-developed and well-nourished. No distress.  Well-appearing, comfortable, cooperative  HENT:  Head: Normocephalic and atraumatic.  Mild dry mucus mem  Eyes: Conjunctivae are normal. Right eye exhibits no discharge. Left eye exhibits no discharge.  Cardiovascular: Normal rate.  Pulmonary/Chest: Effort normal.  Musculoskeletal: She exhibits no edema.  Neurological: She is alert and oriented to person, place, and time.  Skin: Skin is warm and dry. No rash noted. She is not diaphoretic. No erythema.  Psychiatric: She has a normal mood and affect. Her behavior is normal.  Well groomed, good eye contact, normal speech and thoughts. Appears less anxious.  Nursing note and vitals reviewed.   Results for orders placed or performed in visit on 04/22/17  POCT Urinalysis Dipstick  Result Value Ref Range   Color, UA yellow    Clarity, UA clear    Glucose, UA 1,000    Bilirubin, UA neg    Ketones, UA neg    Spec Grav, UA 1.025 1.010 - 1.025   Blood, UA  neg    pH, UA 5.0 5.0 - 8.0   Protein, UA neg    Urobilinogen, UA 0.2 0.2 or 1.0 E.U./dL   Nitrite, UA neg    Leukocytes, UA Negative Negative      Assessment & Plan:   Problem List Items Addressed This Visit    Anxiety associated with depression - Primary    Stable to improved GAD, mostly due to improved work situation. Some improved on higher dose Sertraline 100 to 150 - Has been affecting physically and some fatigue/poor sleep -GAD7: 13>16, difficult / PHQ2:1 to PHQ9 8 - Failed Meds: Wellbutrin - No prior Psych / counseling  Plan: 1. Continue Sertraline 150mg  daily - advised give longer to work, if after 3-4 more weeks still not improved, may try self increase to x 2 pills for 200mg  daily, if does better, may notify office for refill Sertraline 200mg  dose (using 100mg  tabs) - In future consider adjunct Buspar therapy for likely GAD vs switch to Lexapro 2. Advised recommend therapy / counseling in future 3. Follow-up 3 mo anxiety, med adjust, GAD7/PHQ9       Other Visit Diagnoses    Urinary frequency      Secondary to [place] SGLT2 and combination poor hydration while on this med, limited water despite coffee, caffeine, soda intake. Consistent with UA results large amt glucose, no sign of UTI, abnormal elevated spec grav, concern mild dehydration vs concentrated urine  Plan: 1. Reassurance - goal to improve water intake, limite caffeine / herbal tea 2. Continue current Farxiga 5mg  daily - may increase in future if needed, definitely needs improved hydration then 3. Follow-up as planned 3 months Annual phys    Relevant Orders   POCT Urinalysis Dipstick (Completed)      Follow up plan: Return in about 3 months (around 07/23/2017) for Annual Physical.  Nobie Putnam, DO [facility] 04/22/2017, 10:54 PM

## 2017-04-22 NOTE — Assessment & Plan Note (Signed)
Stable to improved GAD, mostly due to improved work situation. Some improved on higher dose Sertraline 100 to 150 - Has been affecting physically and some fatigue/poor sleep -GAD7: 13>16, difficult / PHQ2:1 to PHQ9 8 - Failed Meds: Wellbutrin - No prior Psych / counseling  Plan: 1. Continue Sertraline 150mg  daily - advised give longer to work, if after 3-4 more weeks still not improved, may try self increase to x 2 pills for 200mg  daily, if does better, may notify office for refill Sertraline 200mg  dose (using 100mg  tabs) - In future consider adjunct Buspar therapy for likely GAD vs switch to Lexapro 2. Advised recommend therapy / counseling in future 3. Follow-up 3 mo anxiety, med adjust, GAD7/PHQ9

## 2017-04-22 NOTE — Patient Instructions (Addendum)
Thank you for coming to the clinic today.  1.  Check with insurance to see cost and coverage - Routine Screening HIV antibody and Hepatitis C antibody  2. Continue Sertraline 150mg  (one and a half tablet nightly) after 3-4 weeks if you feel like need a little more improvement or higher dose, go ahead and take TWO tablets (100mg  each for dose of 200mg ) nightly, and call office and request refill of Sertraline 100mg  with more pills to take 2 nightly.  Otherwise give [place] an update if you plan to continue at 150mg  nightly instead.  2. You are having urinary frequency due to the [place], it is causing you to urinate more due to sugar in the urine. Your urine today is concentrated and mildly dehydrated, due to limited water intake.  Recommend drinking less caffeine and tea, also less coffee if possible. More water will help flush your kidneys and promote good health.  You will be due for FASTING BLOOD WORK (no food or drink after midnight before, only water or coffee without cream/sugar on the morning of)  - Please go ahead and schedule a "Lab Only" visit in the morning at the clinic for lab draw in 3 months   before next Annual Physical - Make sure Lab Only appointment is at least 1-2 weeks before your next appointment, so that results will be available  For Lab Results, once available within 2-3 days of blood draw, you can can log in to MyChart online to view your results and a brief explanation. Also, we can discuss results at next follow-up visit.  Please schedule a Follow-up Appointment to: Return in about 3 months (around 07/23/2017) for Annual Physical.  If you have any other questions or concerns, please feel free to call the clinic or send a message through [place]. You may also schedule an earlier appointment if necessary.  Additionally, you may be receiving a survey about your experience at our clinic within a few days to 1 week by e-mail or mail. We value your feedback.  Nobie Putnam, DO [facility]

## 2017-05-11 LAB — HM DIABETES EYE EXAM

## 2017-07-10 ENCOUNTER — Other Ambulatory Visit: Payer: Managed Care, Other (non HMO)

## 2017-07-14 ENCOUNTER — Other Ambulatory Visit: Payer: Managed Care, Other (non HMO)

## 2017-07-14 DIAGNOSIS — Z1159 Encounter for screening for other viral diseases: Secondary | ICD-10-CM

## 2017-07-14 DIAGNOSIS — Z114 Encounter for screening for human immunodeficiency virus [HIV]: Secondary | ICD-10-CM

## 2017-07-14 DIAGNOSIS — I10 Essential (primary) hypertension: Secondary | ICD-10-CM

## 2017-07-14 DIAGNOSIS — E1169 Type 2 diabetes mellitus with other specified complication: Secondary | ICD-10-CM

## 2017-07-14 DIAGNOSIS — Z0000 Encounter for general adult medical examination without abnormal findings: Secondary | ICD-10-CM

## 2017-07-14 DIAGNOSIS — E119 Type 2 diabetes mellitus without complications: Secondary | ICD-10-CM

## 2017-07-14 DIAGNOSIS — E785 Hyperlipidemia, unspecified: Secondary | ICD-10-CM

## 2017-07-15 ENCOUNTER — Ambulatory Visit (INDEPENDENT_AMBULATORY_CARE_PROVIDER_SITE_OTHER): Payer: Managed Care, Other (non HMO) | Admitting: Family Medicine

## 2017-07-15 ENCOUNTER — Encounter: Payer: Self-pay | Admitting: Family Medicine

## 2017-07-15 VITALS — BP 98/67 | HR 77 | Temp 97.8°F | Resp 16 | Ht 66.0 in | Wt 177.0 lb

## 2017-07-15 DIAGNOSIS — E1169 Type 2 diabetes mellitus with other specified complication: Secondary | ICD-10-CM

## 2017-07-15 DIAGNOSIS — E1165 Type 2 diabetes mellitus with hyperglycemia: Secondary | ICD-10-CM

## 2017-07-15 DIAGNOSIS — Z23 Encounter for immunization: Secondary | ICD-10-CM

## 2017-07-15 DIAGNOSIS — I10 Essential (primary) hypertension: Secondary | ICD-10-CM | POA: Diagnosis not present

## 2017-07-15 DIAGNOSIS — E785 Hyperlipidemia, unspecified: Secondary | ICD-10-CM

## 2017-07-15 DIAGNOSIS — IMO0001 Reserved for inherently not codable concepts without codable children: Secondary | ICD-10-CM

## 2017-07-15 DIAGNOSIS — F418 Other specified anxiety disorders: Secondary | ICD-10-CM | POA: Diagnosis not present

## 2017-07-15 DIAGNOSIS — Z0000 Encounter for general adult medical examination without abnormal findings: Secondary | ICD-10-CM | POA: Diagnosis not present

## 2017-07-15 LAB — CBC WITH DIFFERENTIAL/PLATELET
BASOS ABS: 41 {cells}/uL (ref 0–200)
Basophils Relative: 0.7 %
EOS ABS: 70 {cells}/uL (ref 15–500)
Eosinophils Relative: 1.2 %
HCT: 43.4 % (ref 35.0–45.0)
Hemoglobin: 14.4 g/dL (ref 11.7–15.5)
Lymphs Abs: 1931 cells/uL (ref 850–3900)
MCH: 29.2 pg (ref 27.0–33.0)
MCHC: 33.2 g/dL (ref 32.0–36.0)
MCV: 88 fL (ref 80.0–100.0)
MONOS PCT: 8 %
MPV: 10.9 fL (ref 7.5–12.5)
NEUTROS PCT: 56.8 %
Neutro Abs: 3294 cells/uL (ref 1500–7800)
PLATELETS: 178 10*3/uL (ref 140–400)
RBC: 4.93 10*6/uL (ref 3.80–5.10)
RDW: 12.9 % (ref 11.0–15.0)
TOTAL LYMPHOCYTE: 33.3 %
WBC mixed population: 464 cells/uL (ref 200–950)
WBC: 5.8 10*3/uL (ref 3.8–10.8)

## 2017-07-15 LAB — COMPLETE METABOLIC PANEL WITH GFR
AG Ratio: 1.9 (calc) (ref 1.0–2.5)
ALKALINE PHOSPHATASE (APISO): 79 U/L (ref 33–130)
ALT: 15 U/L (ref 6–29)
AST: 12 U/L (ref 10–35)
Albumin: 4.6 g/dL (ref 3.6–5.1)
BILIRUBIN TOTAL: 0.9 mg/dL (ref 0.2–1.2)
BUN: 17 mg/dL (ref 7–25)
CHLORIDE: 100 mmol/L (ref 98–110)
CO2: 24 mmol/L (ref 20–32)
Calcium: 9.8 mg/dL (ref 8.6–10.4)
Creat: 0.78 mg/dL (ref 0.50–1.05)
GFR, Est African American: 98 mL/min/{1.73_m2} (ref >=60)
GFR, Est Non African American: 85 mL/min/{1.73_m2} (ref >=60)
GLUCOSE: 262 mg/dL — AB (ref 65–99)
Globulin: 2.4 g/dL (calc) (ref 1.9–3.7)
Potassium: 3.9 mmol/L (ref 3.5–5.3)
Sodium: 137 mmol/L (ref 135–146)
Total Protein: 7 g/dL (ref 6.1–8.1)

## 2017-07-15 LAB — HEPATITIS C ANTIBODY
HEP C AB: NONREACTIVE
SIGNAL TO CUT-OFF: 0.01 (ref <1.00)

## 2017-07-15 LAB — HEMOGLOBIN A1C
EAG (MMOL/L): 12.5 (calc)
Hgb A1c MFr Bld: 9.5 % of total Hgb — ABNORMAL HIGH (ref <5.7)
MEAN PLASMA GLUCOSE: 226 (calc)

## 2017-07-15 LAB — LIPID PANEL
Cholesterol: 240 mg/dL — ABNORMAL HIGH (ref <200)
HDL: 63 mg/dL (ref >50)
LDL CHOLESTEROL (CALC): 139 mg/dL — AB
Non-HDL Cholesterol (Calc): 177 mg/dL (calc) — ABNORMAL HIGH (ref <130)
TRIGLYCERIDES: 249 mg/dL — AB (ref <150)
Total CHOL/HDL Ratio: 3.8 (calc) (ref <5.0)

## 2017-07-15 LAB — HIV ANTIBODY (ROUTINE TESTING W REFLEX): HIV 1&2 Ab, 4th Generation: NONREACTIVE

## 2017-07-15 MED ORDER — LISINOPRIL 5 MG PO TABS: 5.0000 mg | ORAL_TABLET | Freq: Every day | ORAL | 3 refills | Status: DC

## 2017-07-15 NOTE — Patient Instructions (Addendum)
Thank you for coming to the clinic today.  1. A1c increased to 9.5 (from 7.3) - Try double dose Wilder Glade take two of the 5mg  tablets at once - daily - if tolerate well and able to stay well hydrated, then call me or send message, and we can send in new rx for Farxiga 10mg  daily  Try to increase Metformin XR - take TWO 500mg  in morning with food, and take ONE in evening with food - If works well, notify office and we can re-order larger pill amount  In future if we need to we can add another medicine, such as an injectable  - Bydureon BCise (Exenatide ER) - once weekly - this is my preference, very good medicine well tolerated, less side effects of nausea, upset stomach. No dose changes. Cost and coverage is the problem, but we may be able to get it with the coupon card  -  Trulicity (Dulaglutide) - once weekly - this is very good one, usually one of my top choices as well, two doses, 0.75 (likely we would start) and 1.5 max dose. We can use coupon card here too  2. Eat at least 3 meals and 1-2 snacks per day (don't skip breakfast).  Aim for no more than 5 hours between eating. - Tip: If you go >5 hours without eating and become very hungry, your body will supply it's own resources temporarily and you can gain extra weight when you eat.  The 5 Minute Rule of Exercise - Promise yourself to at least do 5 minutes of exercise (make sure you time it), and if at the end of 5 minutes (this is the hardest part of the work-out), if you still feel like you want stop (or not motivated to continue) then allow yourself to stop. Otherwise, more often than not you will feel encouraged that you can continue for a little while longer or even more!  Diet Recommendations for Diabetes   Reduce Starchy (carb) foods include: Bread, rice, pasta, potatoes, corn, crackers, bagels, muffins, all baked goods.   Protein foods include: Meat, fish, poultry, eggs, dairy foods, and beans such as pinto and kidney beans (beans  also provide carbohydrate).   1. Eat at least 3 meals and 1-2 snacks per day. Never go more than 4-5 hours while awake without eating.   2. Limit starchy foods to TWO per meal and ONE per snack. ONE portion of a starchy  food is equal to the following:   - ONE slice of bread (or its equivalent, such as half of a hamburger bun).   - 1/2 cup of a "scoopable" starchy food such as potatoes or rice.   - 1 OUNCE (28 grams) of starchy snacks (crackers or pretzels, look on label).   - 15 grams of carbohydrate as shown on food label.   3. Both lunch and dinner should include a protein food, a carb food, and vegetables.   - Obtain twice as many veg's as protein or carbohydrate foods for both lunch and dinner.   - Try to keep frozen veg's on hand for a quick vegetable serving.     - Fresh or frozen veg's are best.   4. Breakfast should always include protein.     Please schedule a Follow-up Appointment to: Return in about 3 months (around 10/14/2017) for DM A1c, Weight.  If you have any other questions or concerns, please feel free to call the clinic or send a message through [place]. You may also schedule  an earlier appointment if necessary.  Additionally, you may be receiving a survey about your experience at our clinic within a few days to 1 week by e-mail or mail. We value your feedback.  Nobie Putnam, DO [place]

## 2017-07-15 NOTE — Progress Notes (Signed)
Subjective:    Patient ID: Andrea Mcdonald, female    DOB: 1961-08-06, 56 y.o.   MRN: [id number]  Andrea Mcdonald is a 56 y.o. female presenting on 07/15/2017 for Annual Exam   HPI   Here for Annual Exam and Lab Review.  CHRONIC DM, Type 2: Reports concerns uncontrolled lifestyle with poor dietary habits, no exercise outside of work and significant life stressors with family (relationships, ill family with mother, caregiver now) CBGs: Not checking, has glucometer. Meds: Metformin XR 500mg  x 2 for 1000mg  daily, Farxiga 5mg  daily - Prior meds on Tresiba and Victoza Reports good compliance - rare missed dose. Tolerating well, except occasional GI intolerance metformin in past when on 2000mg , some increased urination on farxiga Currently on ACEi Lifestyle: - Diet (Not following DM diet, inc portions, not limiting carbs, frequent stress eating, snack foods) - Exercise (No regular exercise, walking at work, but otherwise none) Denies hypoglycemia  HYPERLIPIDEMIA: - Reports concerns poor lifestyle and uncontrolled sugar. Last lipid panel 06/2017, abnormal - Currently taking Rosuvastatin 20mg  nightly, tolerating well without side effects or myalgias  CHRONIC HTN: Reports no new concern. Not checking BP outside office Current Meds - Lisinopril 5mg  daily   Reports good compliance, took meds today. Tolerating well, w/o complaints.  PMH - Anxiety with Depression  Health Maintenance: - Due for Flu Shot, will receive today - Declines Pneumonia vaccine - UTD on Mammogram, 09/2016 - UTD on Colonoscopy - UTD on routine HIV and Hep screening, negative 07/14/17  Depression screen [facility] 2/9 04/22/2017 03/16/2017 02/12/2016  Decreased Interest 1 0 0  Down, Depressed, Hopeless 1 1 0  PHQ - 2 Score 2 1 0  Altered sleeping 1 - -  Tired, decreased energy 3 - -  Change in appetite 1 - -  Feeling bad or failure about yourself  0 - -  Trouble concentrating 1 - -  Moving slowly or  fidgety/restless 0 - -  Suicidal thoughts 0 - -  PHQ-9 Score 8 - -  Difficult doing work/chores Somewhat difficult - -    Past Medical History:  Diagnosis Date  . High cholesterol   . Hypertension 05/25/2013  . Plantar fasciitis, bilateral 05/25/2013   Past Surgical History:  Procedure Laterality Date  . AUGMENTATION MAMMAPLASTY    . ESOPHAGOGASTRIC FUNDOPLICATION  3300   at [facility], for GERD  . KIDNEY STONE SURGERY    . LITHOTRIPSY    . NECK SURGERY     x2  . PARTIAL HYSTERECTOMY    . PLACEMENT OF BREAST IMPLANTS     Social History   Social History  . Marital status: Married    Spouse name: N/A  . Number of children: N/A  . Years of education: N/A   Occupational History  . Not on file.   Social History Main Topics  . Smoking status: Never Smoker  . Smokeless tobacco: Never Used  . Alcohol use Yes     Comment: drink occ  . Drug use: No  . Sexual activity: Yes   Other Topics Concern  . Not on file   Social History Narrative  . No narrative on file   Family History  Problem Relation Age of Onset  . Heart disease Mother   . Depression Mother   . Anxiety disorder Mother   . Breast cancer Neg Hx    Current Outpatient Prescriptions on File Prior to Visit  Medication Sig  . aspirin 81 MG chewable tablet Chew 81 mg by mouth  at bedtime.  . dapagliflozin propanediol (FARXIGA) 5 MG TABS tablet Take 5 mg by mouth daily.  . fluticasone (FLONASE) 50 MCG/ACT nasal spray Place 2 sprays into both nostrils daily.  Marland Kitchen loratadine (CLARITIN) 10 MG tablet Take 1 tablet (10 mg total) by mouth daily.  . metFORMIN (GLUCOPHAGE-XR) 500 MG 24 hr tablet Take 2 tablets (1,000 mg total) by mouth daily with breakfast.  . PAZEO 0.7 % SOLN   . rosuvastatin (CRESTOR) 20 MG tablet Take 1 tablet (20 mg total) by mouth at bedtime.  . sertraline (ZOLOFT) 100 MG tablet Take 1.5 tablets (150 mg total) by mouth at bedtime.   No current facility-administered medications on file prior to visit.      Review of Systems  Constitutional: Negative for activity change, appetite change, chills, diaphoresis, fatigue, fever and unexpected weight change.  HENT: Negative for congestion, hearing loss and sinus pressure.   Eyes: Negative for visual disturbance.  Respiratory: Negative for apnea, cough, choking, chest tightness, shortness of breath and wheezing.   Cardiovascular: Negative for chest pain, palpitations and leg swelling.  Gastrointestinal: Negative for abdominal pain, anal bleeding, blood in stool, constipation, diarrhea, nausea and vomiting.  Endocrine: Positive for polydipsia, polyphagia and polyuria. Negative for cold intolerance.  Genitourinary: Positive for frequency. Negative for difficulty urinating, dysuria and hematuria.  Musculoskeletal: Negative for arthralgias and neck pain.  Skin: Negative for rash.  Allergic/Immunologic: Negative for environmental allergies.  Neurological: Negative for dizziness, weakness, light-headedness, numbness and headaches.  Hematological: Negative for adenopathy.  Psychiatric/Behavioral: Positive for sleep disturbance. Negative for behavioral problems, dysphoric mood, self-injury and suicidal ideas. The patient is nervous/anxious.    Per HPI unless specifically indicated above     Objective:    BP 98/67   Pulse 77   Temp 97.8 F (36.6 C) (Oral)   Resp 16   Ht 5\' 6"  (1.676 m)   Wt 177 lb (80.3 kg)   BMI 28.57 kg/m   Wt Readings from Last 3 Encounters:  07/15/17 177 lb (80.3 kg)  04/22/17 176 lb (79.8 kg)  03/16/17 177 lb 12.8 oz (80.6 kg)    Physical Exam  Constitutional: She is oriented to person, place, and time. She appears well-developed and well-nourished. No distress.  Well-appearing, comfortable, cooperative  HENT:  Head: Normocephalic and atraumatic.  Mouth/Throat: Oropharynx is clear and moist.  Eyes: Pupils are equal, round, and reactive to light. Conjunctivae and EOM are normal. Right eye exhibits no discharge. Left  eye exhibits no discharge.  Neck: Normal range of motion. Neck supple. No thyromegaly present.  Cardiovascular: Normal rate, regular rhythm, normal heart sounds and intact distal pulses.   No murmur heard. Pulmonary/Chest: Effort normal and breath sounds normal. No respiratory distress. She has no wheezes. She has no rales.  Abdominal: Soft. Bowel sounds are normal. She exhibits no distension and no mass. There is no tenderness.  Musculoskeletal: Normal range of motion. She exhibits no edema or tenderness.  Upper / Lower Extremities: - Normal muscle tone, strength bilateral upper extremities 5/5, lower extremities 5/5  Lymphadenopathy:    She has no cervical adenopathy.  Neurological: She is alert and oriented to person, place, and time.  Distal sensation intact to light touch all extremities  Skin: Skin is warm and dry. No rash noted. She is not diaphoretic. No erythema.  Psychiatric: She has a normal mood and affect. Her behavior is normal.  Well groomed, good eye contact, normal speech and thoughts  Nursing note and vitals reviewed.  Diabetic Foot Exam - Simple   Simple Foot Form Diabetic Foot exam was performed with the following findings:  Yes 07/15/2017  3:30 PM  Visual Inspection See comments:  Yes Sensation Testing Intact to touch and monofilament testing bilaterally:  Yes Pulse Check Posterior Tibialis and Dorsalis pulse intact bilaterally:  Yes Comments Mild callus formation bilateral medial aspect of great toe forefoot, non ulcerated. Otherwise feet normal appearance.     Recent Labs  12/02/16 1613 03/16/17 1652 07/14/17 0828  HGBA1C 7.6 7.3* 9.5*     Results for orders placed or performed in visit on 07/14/17  COMPLETE METABOLIC PANEL WITH GFR  Result Value Ref Range   Glucose, Bld 262 (H) 65 - 99 mg/dL   BUN 17 7 - 25 mg/dL   Creat 0.78 0.50 - 1.05 mg/dL   GFR, Est Non African American 85 > OR = 60 mL/min/1.85m2   GFR, Est African American 98 > OR = 60  mL/min/1.4m2   BUN/Creatinine Ratio NOT APPLICABLE 6 - 22 (calc)   Sodium 137 135 - 146 mmol/L   Potassium 3.9 3.5 - 5.3 mmol/L   Chloride 100 98 - 110 mmol/L   CO2 24 20 - 32 mmol/L   Calcium 9.8 8.6 - 10.4 mg/dL   Total Protein 7.0 6.1 - 8.1 g/dL   Albumin 4.6 3.6 - 5.1 g/dL   Globulin 2.4 1.9 - 3.7 g/dL (calc)   AG Ratio 1.9 1.0 - 2.5 (calc)   Total Bilirubin 0.9 0.2 - 1.2 mg/dL   Alkaline phosphatase (APISO) 79 33 - 130 U/L   AST 12 10 - 35 U/L   ALT 15 6 - 29 U/L  Lipid panel  Result Value Ref Range   Cholesterol 240 (H) <200 mg/dL   HDL 63 >50 mg/dL   Triglycerides 249 (H) <150 mg/dL   LDL Cholesterol (Calc) 139 (H) mg/dL (calc)   Total CHOL/HDL Ratio 3.8 <5.0 (calc)   Non-HDL Cholesterol (Calc) 177 (H) <130 mg/dL (calc)  CBC with Differential/Platelet  Result Value Ref Range   WBC 5.8 3.8 - 10.8 Thousand/uL   RBC 4.93 3.80 - 5.10 Million/uL   Hemoglobin 14.4 11.7 - 15.5 g/dL   HCT 43.4 35.0 - 45.0 %   MCV 88.0 80.0 - 100.0 fL   MCH 29.2 27.0 - 33.0 pg   MCHC 33.2 32.0 - 36.0 g/dL   RDW 12.9 11.0 - 15.0 %   Platelets 178 140 - 400 Thousand/uL   MPV 10.9 7.5 - 12.5 fL   Neutro Abs 3,294 1,500 - 7,800 cells/uL   Lymphs Abs 1,931 850 - 3,900 cells/uL   WBC mixed population 464 200 - 950 cells/uL   Eosinophils Absolute 70 15 - 500 cells/uL   Basophils Absolute 41 0 - 200 cells/uL   Neutrophils Relative % 56.8 %   Total Lymphocyte 33.3 %   Monocytes Relative 8.0 %   Eosinophils Relative 1.2 %   Basophils Relative 0.7 %  Hemoglobin A1c  Result Value Ref Range   Hgb A1c MFr Bld 9.5 (H) <5.7 % of total Hgb   Mean Plasma Glucose 226 (calc)   eAG (mmol/L) 12.5 (calc)  Hepatitis C antibody  Result Value Ref Range   Hepatitis C Ab NON-REACTIVE NON-REACTI   SIGNAL TO CUT-OFF 0.01 <1.00  HIV antibody  Result Value Ref Range   HIV 1&2 Ab, 4th Generation NON-REACTIVE NON-REACTI      Assessment & Plan:   Problem List Items Addressed This Visit  Anxiety associated  with depression   Essential hypertension    Well-controlled HTN, low normal - Home BP readings none  No known complications  Plan:  1. Continue current BP regimen - Lisinopril 5mg  daily 2. Encourage improved lifestyle - low sodium diet, regular exercise 3. Start monitor BP outside office, bring readings to next visit, if persistently >140/90 or new symptoms notify office sooner 4. Follow-up 3 months      Relevant Medications   lisinopril (PRINIVIL,ZESTRIL) 5 MG tablet   Hyperlipidemia associated with type 2 diabetes mellitus ([place])    Uncontrolled cholesterol on statin, but poor lifestyle Last lipid panel 06/2017 Calculated ASCVD 10 yr risk score elevated  Plan: 1. Continue current meds - Rosuvastatin 20mg  nightly - seems to be adhering to it 2. Encourage improved lifestyle - low carb/cholesterol, reduce portion size, start regular exercise      Relevant Medications   lisinopril (PRINIVIL,ZESTRIL) 5 MG tablet   Uncontrolled diabetes mellitus type 2 without complications ([place])    Uncontrolled DM with A1c 9.5 from 7.3 (worsening control dramatically over 3-4 months) No known complications or hypoglycemia.  Plan:  1. Discussed management of DM again with lifestyle and medications - concerns with stressors affecting her barrier to care 2. Increase Farxiga 5 to 10mg , take two of existing 5mg  tabs for 10mg  daily, see if tolerate well, try to improve hydration - if tolerate well can request new rx Farxiga 10mg  daily, already has coupon discount card 3. Increase Metformin from 500mg  XR x 2 in AM to add one 500mg  XR tab in evening, if tolerating well can request new rx 3 pills daily 4. Discussed addition of another agent with possible GLP1, once weekly 5. Encourage improved lifestyle - emphasized importance low carb, low sugar diet, reduce portion size, start regular exercise 6. Check CBG, bring log to next visit for review 7. Continue ACEi, Statin 8. DM Foot exam done today / Advised to  schedule DM ophtho exam, send record 9. Follow-up 3 months A1c      Relevant Medications   lisinopril (PRINIVIL,ZESTRIL) 5 MG tablet    Other Visit Diagnoses    Annual physical exam    -  Primary   Needs flu shot       Relevant Orders   Flu Vaccine QUAD 36+ mos IM (Completed)      Meds ordered this encounter  Medications  . lisinopril (PRINIVIL,ZESTRIL) 5 MG tablet    Sig: Take 1 tablet (5 mg total) by mouth daily.    Dispense:  90 tablet    Refill:  3    Follow up plan: Return in about 3 months (around 10/14/2017) for DM A1c, Weight.  Nobie Putnam, [facility] 07/16/2017, 12:42 AM

## 2017-07-16 NOTE — Assessment & Plan Note (Signed)
Well-controlled HTN, low normal - Home BP readings none  No known complications  Plan:  1. Continue current BP regimen - Lisinopril 5mg  daily 2. Encourage improved lifestyle - low sodium diet, regular exercise 3. Start monitor BP outside office, bring readings to next visit, if persistently >140/90 or new symptoms notify office sooner 4. Follow-up 3 months

## 2017-07-16 NOTE — Assessment & Plan Note (Signed)
Uncontrolled cholesterol on statin, but poor lifestyle Last lipid panel 06/2017 Calculated ASCVD 10 yr risk score elevated  Plan: 1. Continue current meds - Rosuvastatin 20mg  nightly - seems to be adhering to it 2. Encourage improved lifestyle - low carb/cholesterol, reduce portion size, start regular exercise

## 2017-07-16 NOTE — Assessment & Plan Note (Signed)
Uncontrolled DM with A1c 9.5 from 7.3 (worsening control dramatically over 3-4 months) No known complications or hypoglycemia.  Plan:  1. Discussed management of DM again with lifestyle and medications - concerns with stressors affecting her barrier to care 2. Increase Farxiga 5 to 10mg , take two of existing 5mg  tabs for 10mg  daily, see if tolerate well, try to improve hydration - if tolerate well can request new rx Farxiga 10mg  daily, already has coupon discount card 3. Increase Metformin from 500mg  XR x 2 in AM to add one 500mg  XR tab in evening, if tolerating well can request new rx 3 pills daily 4. Discussed addition of another agent with possible GLP1, once weekly 5. Encourage improved lifestyle - emphasized importance low carb, low sugar diet, reduce portion size, start regular exercise 6. Check CBG, bring log to next visit for review 7. Continue ACEi, Statin 8. DM Foot exam done today / Advised to schedule DM ophtho exam, send record 9. Follow-up 3 months A1c

## 2017-08-14 ENCOUNTER — Other Ambulatory Visit: Payer: Self-pay

## 2017-08-14 DIAGNOSIS — J301 Allergic rhinitis due to pollen: Secondary | ICD-10-CM

## 2017-08-14 MED ORDER — LORATADINE 10 MG PO TABS: 10.0000 mg | ORAL_TABLET | Freq: Every day | ORAL | 2 refills | Status: DC

## 2017-08-14 MED ORDER — FLUTICASONE PROPIONATE 50 MCG/ACT NA SUSP: 2.0000 | Freq: Every day | NASAL | 2 refills | Status: DC

## 2017-09-21 ENCOUNTER — Ambulatory Visit
Admission: RE | Admit: 2017-09-21 | Discharge: 2017-09-21 | Disposition: A | Discharge status: Home / Self Care | Payer: Managed Care, Other (non HMO) | Source: Ambulatory Visit | Attending: Nurse Practitioner | Admitting: Nurse Practitioner

## 2017-09-21 ENCOUNTER — Encounter: Payer: Self-pay | Admitting: Nurse Practitioner

## 2017-09-21 ENCOUNTER — Ambulatory Visit: Payer: Managed Care, Other (non HMO) | Admitting: Nurse Practitioner

## 2017-09-21 VITALS — BP 107/60 | HR 92 | Temp 98.6°F | Resp 16 | Ht 66.0 in | Wt 179.6 lb

## 2017-09-21 DIAGNOSIS — R05 Cough: Secondary | ICD-10-CM

## 2017-09-21 DIAGNOSIS — J181 Lobar pneumonia, unspecified organism: Secondary | ICD-10-CM | POA: Diagnosis not present

## 2017-09-21 DIAGNOSIS — R5383 Other fatigue: Secondary | ICD-10-CM | POA: Diagnosis present

## 2017-09-21 DIAGNOSIS — R509 Fever, unspecified: Secondary | ICD-10-CM | POA: Insufficient documentation

## 2017-09-21 DIAGNOSIS — R059 Cough, unspecified: Secondary | ICD-10-CM

## 2017-09-21 DIAGNOSIS — J189 Pneumonia, unspecified organism: Secondary | ICD-10-CM

## 2017-09-21 MED ORDER — ONDANSETRON HCL 4 MG PO TABS: 4.0000 mg | ORAL_TABLET | Freq: Three times a day (TID) | ORAL | 0 refills | Status: DC | PRN

## 2017-09-21 NOTE — Patient Instructions (Addendum)
Andrea Mcdonald, Thank you for coming in to clinic today.  1. For your cough and fever: - We are getting a chest Xray today to rule out pneumonia.  2. We are getting labs tomorrow to rule out Mono. This is most likely cause of your symptoms.   Treat symptoms. - Drink plenty of fluids. - For pain and fevers: Start taking Tylenol extra strength 1 to 2 tablets every 6-8 hours for aches or fever/chills for next few days as needed.  Do not take more than 3,000 mg in 24 hours from all medicines.  May take Ibuprofen as well if tolerated 200-400mg  every 8 hours as needed. - May take pepto bismol for nausea or diarrhea.  Or you may take Immodium for diarrhea.  - Cough: continue cough suppressant w/ diabetic tussin or other similar tablet.  Mucinex is also a good option.  Please schedule a follow-up appointment with Cassell Smiles, AGNP. Return 5-7 days if symptoms worsen or fail to improve.  If you have any other questions or concerns, please feel free to call the clinic or send a message through [place]. You may also schedule an earlier appointment if necessary.  You will receive a survey after today's visit either digitally by e-mail or paper by [employer]. Your experiences and feedback matter to [place].  Please respond so we know how we are doing as we provide care for you.   Cassell Smiles, DNP, AGNP-BC Adult Gerontology Nurse Practitioner [place]

## 2017-09-21 NOTE — Progress Notes (Signed)
Subjective:    Patient ID: Andrea Mcdonald, female    DOB: Aug 09, 1961, 56 y.o.   MRN: [id number]  Andrea Mcdonald is a 56 y.o. female presenting on 09/21/2017 for Fatigue (coughing, night sweats, dry-heaving,diarrhea and elevated blood sugar . Pt currently taking Tylenol cold/flu.)   HPI  Cough w/ Fatigue, Malaise Symptoms started 3 days ago on Saturday - started w/ significant fatigue and malaise, cough, night sweats for last several nights, nausea w/ dry heaves, and diarrhea.  Some sore throat.  - Did finish work today, but did not feel like going. - States she feels "completely wiped out." Is a pt w/ diabetes and has had elevated CBGs w/ CBG = 368 last night.  She knows this often means a sign of infection. - Is taking Tylenol cold and flu w/ some relief of symptoms. - No other sick contacts.  Social History   Tobacco Use  . Smoking status: Never Smoker  . Smokeless tobacco: Never Used  Substance Use Topics  . Alcohol use: Yes    Comment: drink occ  . Drug use: No    Review of Systems Per HPI unless specifically indicated above     Objective:    BP 107/60 (BP Location: Right Arm, Patient Position: Sitting, Cuff Size: Normal)   Pulse 92   Temp 98.6 F (37 C) (Oral)   Resp 16   Ht 5\' 6"  (1.676 m)   Wt 179 lb 9.6 oz (81.5 kg)   BMI 28.99 kg/m   Wt Readings from Last 3 Encounters:  09/21/17 179 lb 9.6 oz (81.5 kg)  07/15/17 177 lb (80.3 kg)  04/22/17 176 lb (79.8 kg)    Physical Exam  Constitutional: She is oriented to person, place, and time. She appears well-developed and well-nourished. She has a sickly appearance. She appears distressed (mildly).  HENT:  Head: Normocephalic and atraumatic.  Right Ear: External ear and ear canal normal.  Left Ear: Tympanic membrane, external ear and ear canal normal.  Nose: Nose normal. Right sinus exhibits no maxillary sinus tenderness and no frontal sinus tenderness. Left sinus exhibits no maxillary sinus tenderness  and no frontal sinus tenderness.  Mouth/Throat: Posterior oropharyngeal edema present.  Neck: Normal range of motion. Neck supple. No JVD present. No tracheal deviation present. No thyromegaly present.  Cardiovascular: Normal rate, regular rhythm, normal heart sounds and intact distal pulses.  Pulmonary/Chest: Effort normal. She has no wheezes. She has rhonchi (Bronchial breath sounds). She has no rales.      Abdominal: Soft. Normal appearance. Bowel sounds are increased. There is splenomegaly. There is tenderness in the right upper quadrant and left upper quadrant. There is no rigidity, no rebound, no guarding, no CVA tenderness, no tenderness at McBurney's point and negative Murphy's sign.  Lymphadenopathy:    She has cervical adenopathy.  Neurological: She is alert and oriented to person, place, and time.  Skin: Skin is warm and dry.  Psychiatric: She has a normal mood and affect. Her behavior is normal. Judgment and thought content normal.  Vitals reviewed.   Results for orders placed or performed in visit on 07/14/17  COMPLETE METABOLIC PANEL WITH GFR  Result Value Ref Range   Glucose, Bld 262 (H) 65 - 99 mg/dL   BUN 17 7 - 25 mg/dL   Creat 0.78 0.50 - 1.05 mg/dL   GFR, Est Non African American 85 > OR = 60 mL/min/1.81m2   GFR, Est African American 98 > OR = 60 mL/min/1.22m2  BUN/Creatinine Ratio NOT APPLICABLE 6 - 22 (calc)   Sodium 137 135 - 146 mmol/L   Potassium 3.9 3.5 - 5.3 mmol/L   Chloride 100 98 - 110 mmol/L   CO2 24 20 - 32 mmol/L   Calcium 9.8 8.6 - 10.4 mg/dL   Total Protein 7.0 6.1 - 8.1 g/dL   Albumin 4.6 3.6 - 5.1 g/dL   Globulin 2.4 1.9 - 3.7 g/dL (calc)   AG Ratio 1.9 1.0 - 2.5 (calc)   Total Bilirubin 0.9 0.2 - 1.2 mg/dL   Alkaline phosphatase (APISO) 79 33 - 130 U/L   AST 12 10 - 35 U/L   ALT 15 6 - 29 U/L  Lipid panel  Result Value Ref Range   Cholesterol 240 (H) <200 mg/dL   HDL 63 >50 mg/dL   Triglycerides 249 (H) <150 mg/dL   LDL Cholesterol  (Calc) 139 (H) mg/dL (calc)   Total CHOL/HDL Ratio 3.8 <5.0 (calc)   Non-HDL Cholesterol (Calc) 177 (H) <130 mg/dL (calc)  CBC with Differential/Platelet  Result Value Ref Range   WBC 5.8 3.8 - 10.8 Thousand/uL   RBC 4.93 3.80 - 5.10 Million/uL   Hemoglobin 14.4 11.7 - 15.5 g/dL   HCT 43.4 35.0 - 45.0 %   MCV 88.0 80.0 - 100.0 fL   MCH 29.2 27.0 - 33.0 pg   MCHC 33.2 32.0 - 36.0 g/dL   RDW 12.9 11.0 - 15.0 %   Platelets 178 140 - 400 Thousand/uL   MPV 10.9 7.5 - 12.5 fL   Neutro Abs 3,294 1,500 - 7,800 cells/uL   Lymphs Abs 1,931 850 - 3,900 cells/uL   WBC mixed population 464 200 - 950 cells/uL   Eosinophils Absolute 70 15 - 500 cells/uL   Basophils Absolute 41 0 - 200 cells/uL   Neutrophils Relative % 56.8 %   Total Lymphocyte 33.3 %   Monocytes Relative 8.0 %   Eosinophils Relative 1.2 %   Basophils Relative 0.7 %  Hemoglobin A1c  Result Value Ref Range   Hgb A1c MFr Bld 9.5 (H) <5.7 % of total Hgb   Mean Plasma Glucose 226 (calc)   eAG (mmol/L) 12.5 (calc)  Hepatitis C antibody  Result Value Ref Range   Hepatitis C Ab NON-REACTIVE NON-REACTI   SIGNAL TO CUT-OFF 0.01 <1.00  HIV antibody  Result Value Ref Range   HIV 1&2 Ab, 4th Generation NON-REACTIVE NON-REACTI      Assessment & Plan:   Problem List Items Addressed This Visit    None    Visit Diagnoses    Other fatigue    -  Primary Patient with acute illness and severe fatigue/malaise X3 days and elevated blood sugar.  No known sick contacts.  Consider possible diagnoses of flu, pneumonia, EBV.  Patient with positive egophony on exam may indicate pneumonia.  No other signs of focal infection upper respiratory tract.  Plan: 1.  Point-of-care flu test negative today 2.  Two-view chest x-ray today.  Personal review of imaging indicates possible infiltrate in right middle lobe identified consistent with positive egophony.  Not confirmed on radiology report. 3.  Start Z-Pak. -May also use supportive care  Pepto-Bismol for nausea and diarrhea, Imodium for diarrhea, Tylenol ibuprofen for pain and fevers. 4.  Consider collection of Epstein-Barr virus IgG and IgM if symptoms do not improve.  For now collection of these labs can be held. 5.  Return 5-7 days as needed if symptoms do not improve   Relevant Orders  POCT Influenza A/B   DG Chest 2 View (Completed)   Epstein-Barr virus VCA, IgG   Epstein-Barr virus VCA, IgM   Cough with fever     See AP fatigue above   Relevant Orders   DG Chest 2 View (Completed)   Pneumonia of right middle lobe due to infectious organism ([place])     Acute community-acquired pneumonia supported with chest x-ray.  See AP above   Relevant Medications   azithromycin (ZITHROMAX) 250 MG tablet       Follow up plan: Return 5-7 days if symptoms worsen or fail to improve.   Cassell Smiles, DNP, AGPCNP-BC Adult Gerontology Primary Care Nurse Practitioner [facility] 09/21/2017, 4:05 PM

## 2017-09-22 ENCOUNTER — Other Ambulatory Visit: Payer: Managed Care, Other (non HMO)

## 2017-09-22 MED ORDER — AZITHROMYCIN 250 MG PO TABS: ORAL_TABLET | ORAL | 0 refills | Status: DC

## 2017-09-23 ENCOUNTER — Telehealth: Payer: Self-pay | Admitting: Family Medicine

## 2017-09-23 DIAGNOSIS — R05 Cough: Secondary | ICD-10-CM

## 2017-09-23 DIAGNOSIS — R059 Cough, unspecified: Secondary | ICD-10-CM

## 2017-09-23 MED ORDER — BENZONATATE 100 MG PO CAPS: 100.0000 mg | ORAL_CAPSULE | Freq: Two times a day (BID) | ORAL | 0 refills | Status: DC | PRN

## 2017-09-23 NOTE — Telephone Encounter (Signed)
Pt. Called states that coughing was not getting any better wanted to know if you would call in a cough syrup in for her.

## 2017-09-23 NOTE — Telephone Encounter (Signed)
I will send benzonatate 100 mg twice daily prn for cough.

## 2017-09-25 ENCOUNTER — Encounter: Payer: Self-pay | Admitting: Nurse Practitioner

## 2017-09-25 LAB — POCT INFLUENZA A/B
Influenza A, POC: NEGATIVE
Influenza B, POC: NEGATIVE

## 2017-09-28 ENCOUNTER — Ambulatory Visit
Admission: RE | Admit: 2017-09-28 | Discharge: 2017-09-28 | Disposition: A | Discharge status: Home / Self Care | Payer: Managed Care, Other (non HMO) | Source: Ambulatory Visit | Attending: Family Medicine | Admitting: Family Medicine

## 2017-09-28 DIAGNOSIS — Z1239 Encounter for other screening for malignant neoplasm of breast: Secondary | ICD-10-CM

## 2017-09-28 DIAGNOSIS — Z1231 Encounter for screening mammogram for malignant neoplasm of breast: Secondary | ICD-10-CM | POA: Diagnosis not present

## 2017-10-14 ENCOUNTER — Encounter: Payer: Self-pay | Admitting: Family Medicine

## 2017-10-14 ENCOUNTER — Ambulatory Visit: Payer: Managed Care, Other (non HMO) | Admitting: Family Medicine

## 2017-10-14 VITALS — BP 110/67 | HR 70 | Temp 98.1°F | Resp 16 | Ht 66.0 in | Wt 179.8 lb

## 2017-10-14 DIAGNOSIS — R05 Cough: Secondary | ICD-10-CM

## 2017-10-14 DIAGNOSIS — R059 Cough, unspecified: Secondary | ICD-10-CM

## 2017-10-14 DIAGNOSIS — E1165 Type 2 diabetes mellitus with hyperglycemia: Secondary | ICD-10-CM | POA: Diagnosis not present

## 2017-10-14 DIAGNOSIS — IMO0001 Reserved for inherently not codable concepts without codable children: Secondary | ICD-10-CM

## 2017-10-14 MED ORDER — BENZONATATE 100 MG PO CAPS: 100.0000 mg | ORAL_CAPSULE | Freq: Three times a day (TID) | ORAL | 0 refills | Status: DC | PRN

## 2017-10-14 NOTE — Assessment & Plan Note (Signed)
Dramatically worsening uncontrolled DM with A1c 9.5 up to >14 based on our POC A1c reading in office today, given the disparity in result, will disregard for now and re-check serum lab today - Previously used to be better control < 7, several months to year ago - Attributed to non compliance with med change and non adherence to diet/lifestyle Failed Victoza, Tresiba No known complications or hypoglycemia.  Plan:  1. Again reviewed management of DM again with lifestyle and medications - emphasis on severity of blood sugar, side effects likely causing her fatigue and tired worsening, also complications of untreated - Re-check serum A1c today, f/u result by phone 1-2 days - If still >14 (confirming POC result) or regardless if still >9.0, will start new GLP1 injectable med. She has failed Victoza before, asked her again to check ins to find which preference of other GLP1. If >14 will also send new rx inc Farxiga to 10mg  instead of 5. Continue Metformin 1000mg  AM / 500-1000mg  PM (not able to get XR covered) - Encourage improved lifestyle - emphasized importance low carb, low sugar diet, reduce portion size, start regular exercise - Follow-up 3 months A1c - sooner 6 weeks if worsening sugar, or concerns. Also advised that if uncontrolled 3-6 months, will refer to Endocrinology for assistance if we are unable to control it

## 2017-10-14 NOTE — Progress Notes (Addendum)
Subjective:    Patient ID: Andrea Mcdonald, female    DOB: June 17, 1961, 56 y.o.   MRN: [id number]  DESTYNEE Mcdonald is a 56 y.o. female presenting on 10/14/2017 for Diabetes (as per patient was here last diagnonsed with pneumonia wants provider to listen )   HPI   FOLLOW-UP COUGH / CAP Last visit 09/21/17 with Cassell Smiles, AGPCNP-BC, for URI, cough and fatigue, had negative flu and EBV testing, had CXR that was negative, treated with Azithromycin Z pak, Tessalon Perls, see prior notes for background information. - Interval update with significant improvement within few days to week, now has more residual cough seems non productive, sometimes at worse, using cough drops PRN and Delsym and Robitussin PRN - Today patient reports still coughing, asking if still infection - Still using Flonase regularly, Claritin daily - not tried mucinex - Taking Lisinopril 5mg  daily, has been on this from 10mg  in past several years ago - never had cough or reaction on this med - Denies any fevers/chills, sweats, body aches, sinus pain pressure or congestion, dyspnea, wheezing   CHRONIC DM, Type 2: - Last visit with me 07/15/17, for annual physical and DM visit, treated with increased Metformin dose, and advised to check into GLP meds but she did not, also asked to increase Farxiga from 5 to 10, see prior notes for background information. - Interval update with worsening sugar, elevated, not adhered to inc farxiga dosing - Today reports has been more fatigued and tired, thinks related to recent illness and sugar CBGs: Occasional CBG >300 often when checking Meds: Metformin (non XR) 500mg  x 2 AM and x 1 PM, Farxiga 5mg  daily (taking 1 daily, did not increase to 2) - Prior meds on Tresiba and Victoza Reports good compliance - rare missed dose. Tolerating well, except occasional GI intolerance metformin in past when on 2000mg , some increased urination on farxiga Currently on ACEi Lifestyle: - She has  been to DM nutritionist before - Diet (Not following DM diet, inc portions, not limiting carbs, frequent stress eating, snack foods - unchanged, admits eating a lot of gummy bears, fruit cake) - Exercise (No regular exercise, walking at work, but otherwise none) - Life stressors worse Denies hypoglycemia   Depression screen [facility] 2/9 10/14/2017 04/22/2017 03/16/2017  Decreased Interest 1 1 0  Down, Depressed, Hopeless 2 1 1   PHQ - 2 Score 3 2 1   Altered sleeping 2 1 -  Tired, decreased energy 2 3 -  Change in appetite 1 1 -  Feeling bad or failure about yourself  1 0 -  Trouble concentrating 1 1 -  Moving slowly or fidgety/restless 0 0 -  Suicidal thoughts 0 0 -  PHQ-9 Score 10 8 -  Difficult doing work/chores Somewhat difficult Somewhat difficult -   GAD 7 : Generalized Anxiety Score 10/14/2017 04/22/2017 03/16/2017 02/12/2016  Nervous, Anxious, on Edge 1 2 3 1   Control/stop worrying 1 3 3 2   Worry too much - different things 1 3 1 1   Trouble relaxing 1 2 1 3   Restless 1 1 1  0  Easily annoyed or irritable 1 2 3  0  Afraid - awful might happen 2 3 1  0  Total GAD 7 Score 8 16 13 7   Anxiety Difficulty Somewhat difficult Somewhat difficult Somewhat difficult Somewhat difficult    Social History   Tobacco Use  . Smoking status: Never Smoker  . Smokeless tobacco: Never Used  Substance Use Topics  . Alcohol use: Yes  Comment: drink occ  . Drug use: No    Review of Systems Per HPI unless specifically indicated above     Objective:    BP 110/67   Pulse 70   Temp 98.1 F (36.7 C) (Oral)   Resp 16   Ht 5\' 6"  (1.676 m)   Wt 179 lb 12.8 oz (81.6 kg)   BMI 29.02 kg/m   Wt Readings from Last 3 Encounters:  10/14/17 179 lb 12.8 oz (81.6 kg)  09/21/17 179 lb 9.6 oz (81.5 kg)  07/15/17 177 lb (80.3 kg)    Physical Exam  Constitutional: She is oriented to person, place, and time. She appears well-developed and well-nourished. No distress.  Well but tired appearing,  comfortable, cooperative  HENT:  Head: Normocephalic and atraumatic.  Mouth/Throat: Oropharynx is clear and moist.  Frontal / maxillary sinuses non-tender. Nares patent without purulence or edema. Bilateral TMs clear without erythema, effusion or bulging. Oropharynx clear without erythema, exudates, edema or asymmetry.  Eyes: Conjunctivae are normal. Right eye exhibits no discharge. Left eye exhibits no discharge.  Neck: Normal range of motion. Neck supple.  Cardiovascular: Normal rate, regular rhythm, normal heart sounds and intact distal pulses.  No murmur heard. Pulmonary/Chest: Effort normal and breath sounds normal. No respiratory distress. She has no wheezes. She has no rales.  Occasional cough. Speaks full sentences. Good air movement.  Musculoskeletal: She exhibits no edema.  Lymphadenopathy:    She has no cervical adenopathy.  Neurological: She is alert and oriented to person, place, and time.  Skin: Skin is warm and dry. No rash noted. She is not diaphoretic. No erythema.  Psychiatric: She has a normal mood and affect. Her behavior is normal.  Well groomed, good eye contact, normal speech and thoughts. Mildly anxious in discussing blood sugar results  Nursing note and vitals reviewed.   Recent Labs    03/16/17 1652 07/14/17 0828 10/14/17 1615  HGBA1C 7.3* 9.5* 8.8*      Results for orders placed or performed in visit on 09/21/17  POCT Influenza A/B  Result Value Ref Range   Influenza A, POC Negative Negative   Influenza B, POC Negative Negative      Assessment & Plan:   Problem List Items Addressed This Visit    Uncontrolled diabetes mellitus type 2 without complications ([place]) - Primary    Dramatically worsening uncontrolled DM with A1c 9.5 up to >14 based on our POC A1c reading in office today, given the disparity in result, will disregard for now and re-check serum lab today - Previously used to be better control < 7, several months to year ago - Attributed to  non compliance with med change and non adherence to diet/lifestyle Failed Victoza, Tresiba No known complications or hypoglycemia.  Plan:  1. Again reviewed management of DM again with lifestyle and medications - emphasis on severity of blood sugar, side effects likely causing her fatigue and tired worsening, also complications of untreated - Re-check serum A1c today, f/u result by phone 1-2 days - If still >14 (confirming POC result) or regardless if still >9.0, will start new GLP1 injectable med. She has failed Victoza before, asked her again to check ins to find which preference of other GLP1. If >14 will also send new rx inc Farxiga to 10mg  instead of 5. Continue Metformin 1000mg  AM / 500-1000mg  PM (not able to get XR covered) - Encourage improved lifestyle - emphasized importance low carb, low sugar diet, reduce portion size, start regular exercise -  Follow-up 3 months A1c - sooner 6 weeks if worsening sugar, or concerns. Also advised that if uncontrolled 3-6 months, will refer to Endocrinology for assistance if we are unable to control it  UPDATE 10/15/17** - Serum A1c result is 8.8, suspect that the >14.0 result yesterday POC was an error and inaccurate result. Still concern with sub-optimal control and actually improved from 9.5 to 8.8 - I attempted to reach patient today, and left her voicemail stating that A1c is improved, and notify of error result, and now I recommend ONLY to double Farxiga from 5mg  daily to TWO pills daily for 10mg  and I have [place] 10mg  daily, we will HOLD on GLP1 at this time, we can discuss this at next visit if needed, if she learned more information on which option will work for her based on insurance and if still interested.      Relevant Orders   Hemoglobin A1c    Other Visit Diagnoses    Cough       Suspected residual from bronchitis URI, lingering cough, no sign of recurrent infxn, refill tessalon, start mucinex, avoid pred d/t DM, consider  repeat abx 1 wk   Relevant Medications   benzonatate (TESSALON) 100 MG capsule      Meds ordered this encounter  Medications  . benzonatate (TESSALON) 100 MG capsule    Sig: Take 1 capsule (100 mg total) by mouth 3 (three) times daily as needed for cough.    Dispense:  30 capsule    Refill:  0     Follow up plan: Return in about 3 months (around 01/12/2018) for Diabetes A1c.  Nobie Putnam, DO [facility] 10/14/2017, 5:22 PM

## 2017-10-14 NOTE — Patient Instructions (Addendum)
Thank you for coming to the clinic today.  1.  For your cough, it seems like lingering symptom, recommend continuing with cough medicine.  Start with Mucinex-DM twice daily for 7 to 10 days to help clear congestion and cough  Start Tessalon Perls take 1 capsule up to 3 times a day as needed for cough  Stop robitussin and delsym  Cough may take several more weeks to fully resolve  2. Elevated A1c >14 on our reading, we will re-check with lab draw today, stay tuned for result 1-2 days  If still elevated will likely ADD NEW INJECTION and possibly increase dose of [employer] find cost and coverage of the following  1. Ozempic (Semaglutide injection) - start 0.25mg  weekly for 4 weeks then increase to 0.5mg  weekly - This one has best benefit of weight loss and reducing Cardiovascular events  2. Bydureon BCise (Exenatide ER) - once weekly - this is my preference, very good medicine well tolerated, less side effects of nausea, upset stomach. No dose changes. Cost and coverage is the problem, but we may be able to get it with the coupon card  3. Trulicity (Dulaglutide) - once weekly - this is very good one, usually one of my top choices as well, two doses, 0.75 (likely we would start) and 1.5 max dose. We can use coupon card here too   Please schedule a Follow-up Appointment to: Return in about 3 months (around 01/12/2018) for Diabetes A1c.  If you have any other questions or concerns, please feel free to call the clinic or send a message through [place]. You may also schedule an earlier appointment if necessary.  Additionally, you may be receiving a survey about your experience at our clinic within a few days to 1 week by e-mail or mail. We value your feedback.  Nobie Putnam, DO [place]

## 2017-10-15 LAB — HEMOGLOBIN A1C
Hgb A1c MFr Bld: 8.8 % of total Hgb — ABNORMAL HIGH (ref <5.7)
MEAN PLASMA GLUCOSE: 206 (calc)
eAG (mmol/L): 11.4 (calc)

## 2017-10-15 MED ORDER — DAPAGLIFLOZIN PROPANEDIOL 10 MG PO TABS: 10.0000 mg | ORAL_TABLET | Freq: Every day | ORAL | 11 refills | Status: DC

## 2017-10-15 NOTE — Addendum Note (Signed)
Addended by: Olin Hauser on: 10/15/2017 02:03 PM   Modules accepted: Orders

## 2017-12-10 ENCOUNTER — Other Ambulatory Visit: Payer: Self-pay | Admitting: Family Medicine

## 2017-12-10 DIAGNOSIS — E785 Hyperlipidemia, unspecified: Secondary | ICD-10-CM

## 2018-01-12 ENCOUNTER — Ambulatory Visit: Payer: Managed Care, Other (non HMO) | Admitting: Family Medicine

## 2018-01-15 ENCOUNTER — Ambulatory Visit: Payer: Managed Care, Other (non HMO) | Admitting: Family Medicine

## 2018-01-15 ENCOUNTER — Encounter: Payer: Self-pay | Admitting: Family Medicine

## 2018-01-15 VITALS — BP 110/63 | HR 70 | Temp 98.4°F | Resp 16 | Ht 66.0 in | Wt 178.0 lb

## 2018-01-15 DIAGNOSIS — J301 Allergic rhinitis due to pollen: Secondary | ICD-10-CM

## 2018-01-15 DIAGNOSIS — IMO0001 Reserved for inherently not codable concepts without codable children: Secondary | ICD-10-CM

## 2018-01-15 DIAGNOSIS — E1165 Type 2 diabetes mellitus with hyperglycemia: Secondary | ICD-10-CM | POA: Diagnosis not present

## 2018-01-15 DIAGNOSIS — R350 Frequency of micturition: Secondary | ICD-10-CM | POA: Diagnosis not present

## 2018-01-15 LAB — POCT URINALYSIS DIPSTICK
APPEARANCE: NORMAL
Bilirubin, UA: NEGATIVE
Blood, UA: NEGATIVE
Clarity, UA: NORMAL
GLUCOSE UA: NEGATIVE
Ketones, UA: NEGATIVE
Leukocytes, UA: NEGATIVE
Nitrite, UA: NEGATIVE
ODOR: NEGATIVE
PH UA: 5.5 (ref 5.0–8.0)
Protein, UA: NEGATIVE
Spec Grav, UA: 1.02 (ref 1.010–1.025)
Urobilinogen, UA: 0.2 E.U./dL

## 2018-01-15 LAB — POCT GLYCOSYLATED HEMOGLOBIN (HGB A1C): Hemoglobin A1C: 8.6 — AB (ref ?–5.7)

## 2018-01-15 MED ORDER — LORATADINE 10 MG PO TABS: 10.0000 mg | ORAL_TABLET | Freq: Every day | ORAL | 11 refills | Status: DC

## 2018-01-15 MED ORDER — DULAGLUTIDE 0.75 MG/0.5ML ~~LOC~~ SOAJ: 0.7500 mg | SUBCUTANEOUS | 2 refills | Status: DC

## 2018-01-15 MED ORDER — METFORMIN HCL ER 500 MG PO TB24: ORAL_TABLET | ORAL | 5 refills | Status: DC

## 2018-01-15 MED ORDER — FLUTICASONE PROPIONATE 50 MCG/ACT NA SUSP: 2.0000 | Freq: Every day | NASAL | 11 refills | Status: DC

## 2018-01-15 NOTE — Patient Instructions (Addendum)
Thank you for coming to the office today.  STOP Farxiga 10mg  daily - I think this may have been causing some lower extremity side effects, and also could be related to nerve pain.  START new Trulicity Injection once weekly 0.75mg  - given coupon for reduced cost - if not covered at all may reconsider other meds  Refilled metformin and allergy pills  If not covered call insurance find cost and coverage of the following  1. Ozempic (Semaglutide injection) - start 0.25mg  weekly for 4 weeks then increase to 0.5mg  weekly  2. Bydureon BCise (Exenatide ER) - once weekly - this is my preference, very good medicine well tolerated, less side effects of nausea, upset stomach. No dose changes. Cost and coverage is the problem, but we may be able to get it with the coupon card  DUE for FASTING BLOOD WORK (no food or drink after midnight before the lab appointment, only water or coffee without cream/sugar on the morning of)  SCHEDULE "Lab Only" visit in the morning at the clinic for lab draw in 3 MONTHS   - Make sure Lab Only appointment is at about 1 week before your next appointment, so that results will be available  For Lab Results, once available within 2-3 days of blood draw, you can can log in to MyChart online to view your results and a brief explanation. Also, we can discuss results at next follow-up visit.   Please schedule a Follow-up Appointment to: Return in about 3 months (around 04/17/2018) for DM lab review, med adjust.  If you have any other questions or concerns, please feel free to call the office or send a message through [place]. You may also schedule an earlier appointment if necessary.  Additionally, you may be receiving a survey about your experience at our office within a few days to 1 week by e-mail or mail. We value your feedback.  Nobie Putnam, DO [place]

## 2018-01-15 NOTE — Assessment & Plan Note (Signed)
Refilled Loratadine Flonase

## 2018-01-15 NOTE — Progress Notes (Signed)
Subjective:    Patient ID: Andrea Mcdonald, female    DOB: 1961/10/04, 57 y.o.   MRN: [id number]  Andrea Mcdonald is a 57 y.o. female presenting on 01/15/2018 for Diabetes (highest 280 and lowest 162)   HPI   CHRONIC DM, Type 2: - Last visit with me 09/2017, for DM, had initial POC A1c >14 then repeat serum soon after confirmed that it was inaccurate, showed 8.8, treated with increased Farxiga from 5 to 10, see prior notes for background information. - Interval update with some intolerance side effects to increased [place], described some inc urinary frequency, and Left lower flank discomfort at times, thinks urine is darker, not as well hydrated on medicine, also with bilateral lower leg episodic worsening pins and needles sharp pains and "red appearance", not involving feet - Today reports concern with side effects, asks about medicine, also about circulation of lower legs and symptoms CBGs: fasting AM CBG  Meds:Metformin (non XR) 500mg  x 2 AM and x 1 PM, Farxiga 10mg  daily - Prior meds on Tresiba and Victoza Reports good compliancenow improved tolerating well, except occasional GI intolerance metformin Currently on ACEi Lifestyle: - Diet (trying to eat more fruit, improved now air fryer, still snacking frequently, less sweets) - Exercise (No regular exercise, walking at work) [employer] some episodes of diarrhea or loose stools triggered by spicy foods also noticed at least 2 episodes of seeing "pill" in her stool, it seemed to be metformin pill Admits one episode of hypoglycemia recently with sweaty shaky improved upon eating, but did not check sugar Admits hypoglycemia, polyuria, tingling. Denies visual changes   Depression screen [facility] 2/9 01/15/2018 10/14/2017 04/22/2017  Decreased Interest 0 1 1  Down, Depressed, Hopeless 0 2 1  PHQ - 2 Score 0 3 2  Altered sleeping - 2 1  Tired, decreased energy - 2 3  Change in appetite - 1 1  Feeling bad or failure about yourself  - 1 0   Trouble concentrating - 1 1  Moving slowly or fidgety/restless - 0 0  Suicidal thoughts - 0 0  PHQ-9 Score - 10 8  Difficult doing work/chores - Somewhat difficult Somewhat difficult    Social History   Tobacco Use  . Smoking status: Never Smoker  . Smokeless tobacco: Never Used  Substance Use Topics  . Alcohol use: Yes    Comment: drink occ  . Drug use: No    Review of Systems Per HPI unless specifically indicated above     Objective:    BP 110/63   Pulse 70   Temp 98.4 F (36.9 C) (Oral)   Resp 16   Ht 5\' 6"  (1.676 m)   Wt 178 lb (80.7 kg)   BMI 28.73 kg/m   Wt Readings from Last 3 Encounters:  01/15/18 178 lb (80.7 kg)  10/14/17 179 lb 12.8 oz (81.6 kg)  09/21/17 179 lb 9.6 oz (81.5 kg)    Physical Exam  Constitutional: She is oriented to person, place, and time. She appears well-developed and well-nourished. No distress.  Well-appearing, comfortable, cooperative, overweight  HENT:  Head: Normocephalic and atraumatic.  Mouth/Throat: Oropharynx is clear and moist.  Eyes: Conjunctivae are normal. Right eye exhibits no discharge. Left eye exhibits no discharge.  Neck: Normal range of motion. Neck supple.  Cardiovascular: Normal rate, regular rhythm, normal heart sounds and intact distal pulses.  No murmur heard. Pulmonary/Chest: Effort normal and breath sounds normal. No respiratory distress. She has no wheezes. She has no  rales.  Abdominal: Soft. Bowel sounds are normal. She exhibits no distension. There is no tenderness.  Non tender suprapubic and LLQ  Musculoskeletal: Normal range of motion. She exhibits no edema.  No CVAT or L flank pain  Lower extremities without edema erythema tenderness or asymmetry, sensation intact to light touch  Neurological: She is alert and oriented to person, place, and time.  Skin: Skin is warm and dry. No rash noted. She is not diaphoretic. No erythema.  Psychiatric: She has a normal mood and affect. Her behavior is normal.    Well groomed, good eye contact, normal speech and thoughts  Nursing note and vitals reviewed.   Recent Labs    07/14/17 0828 10/14/17 1615 01/15/18 2254  HGBA1C 9.5* 8.8* 8.6*    Results for orders placed or performed in visit on 01/15/18  POCT HgB A1C  Result Value Ref Range   Hemoglobin A1C 8.6 (A) 5.7  POCT Urinalysis Dipstick  Result Value Ref Range   Color, UA yellow    Clarity, UA normal    Glucose, UA negative    Bilirubin, UA negative    Ketones, UA negative    Spec Grav, UA 1.020 1.010 - 1.025   Blood, UA negative    pH, UA 5.5 5.0 - 8.0   Protein, UA negative    Urobilinogen, UA 0.2 0.2 or 1.0 E.U./dL   Nitrite, UA negative    Leukocytes, UA Negative Negative   Appearance normal    Odor negative       Assessment & Plan:   Problem List Items Addressed This Visit    Allergic rhinitis    Refilled Loratadine Flonase      Relevant Medications   loratadine (CLARITIN) 10 MG tablet   fluticasone (FLONASE) 50 MCG/ACT nasal spray   Uncontrolled diabetes mellitus type 2 without complications ([place]) - Primary    Slightly improved to stable uncontrolled DM A1c now 8.6 from 8.8 - Attributed to non adherence to diet/lifestyle - some improvement, seems inadequate. Now adhering to meds. With some side effects Failed Victoza, Tresiba Rare hypoglycemia  Plan:  1. Concern with potential side effects LE symptoms and urinary side effects vs possible DM neuropathy - discontinue SGLT2 Farxiga 10mg  - Start new rx GLP1 Trulicity 0.75mg  weekly, given copay card, demo pen shown, review benefit risk side effects - Continue Metformin XR 1000mg  AM / 500mg  PM - likely XR version may have some slightly undigested pills - Encourage improved lifestyle - emphasized importance low carb, low sugar diet, reduce portion size, start regular exercise - Follow-up 3 months A1c      Relevant Medications   Dulaglutide (TRULICITY) 1.44 YJ/8.5UD SOPN   metFORMIN (GLUCOPHAGE-XR) 500 MG 24 hr  tablet   Other Relevant Orders   POCT HgB A1C (Completed)   Hemoglobin A1c    Other Visit Diagnoses    Urinary frequency     Negative for UTI, no culture today Suspect secondary to poorly hydrated and taking SGLT2 DC SGLT2 switch to GLP1 see above    Relevant Orders   POCT Urinalysis Dipstick (Completed)      Meds ordered this encounter  Medications  . Dulaglutide (TRULICITY) 1.49 FW/2.6VZ SOPN    Sig: Inject 0.75 mg into the skin once a week.    Dispense:  4 pen    Refill:  2  . metFORMIN (GLUCOPHAGE-XR) 500 MG 24 hr tablet    Sig: Take 2 tabs with breakfast and 1 tab with dinner, for total 3 daily.  Dispense:  90 tablet    Refill:  5  . loratadine (CLARITIN) 10 MG tablet    Sig: Take 1 tablet (10 mg total) by mouth daily.    Dispense:  30 tablet    Refill:  11  . fluticasone (FLONASE) 50 MCG/ACT nasal spray    Sig: Place 2 sprays into both nostrils daily.    Dispense:  16 g    Refill:  11     Follow up plan: Return in about 3 months (around 04/17/2018) for DM lab review, med adjust.  Future labs ordered, A1c for 03/2018  Nobie Putnam, [facility] 01/15/2018, 11:25 PM

## 2018-01-15 NOTE — Assessment & Plan Note (Addendum)
Slightly improved to stable uncontrolled DM A1c now 8.6 from 8.8 - Attributed to non adherence to diet/lifestyle - some improvement, seems inadequate. Now adhering to meds. With some side effects Failed Victoza, Tresiba Rare hypoglycemia  Plan:  1. Concern with potential side effects LE symptoms and urinary side effects vs possible DM neuropathy - discontinue SGLT2 Farxiga 10mg  - Start new rx GLP1 Trulicity 0.75mg  weekly, given copay card, demo pen shown, review benefit risk side effects - Continue Metformin XR 1000mg  AM / 500mg  PM - likely XR version may have some slightly undigested pills - Encourage improved lifestyle - emphasized importance low carb, low sugar diet, reduce portion size, start regular exercise - Follow-up 3 months A1c

## 2018-03-18 ENCOUNTER — Encounter: Payer: Self-pay | Admitting: Family Medicine

## 2018-03-18 ENCOUNTER — Ambulatory Visit: Payer: Managed Care, Other (non HMO) | Admitting: Family Medicine

## 2018-03-18 VITALS — BP 101/61 | HR 74 | Temp 98.8°F | Resp 16 | Ht 66.0 in | Wt 177.0 lb

## 2018-03-18 DIAGNOSIS — S40862A Insect bite (nonvenomous) of left upper arm, initial encounter: Secondary | ICD-10-CM | POA: Diagnosis not present

## 2018-03-18 DIAGNOSIS — R1084 Generalized abdominal pain: Secondary | ICD-10-CM

## 2018-03-18 DIAGNOSIS — W57XXXA Bitten or stung by nonvenomous insect and other nonvenomous arthropods, initial encounter: Secondary | ICD-10-CM | POA: Diagnosis not present

## 2018-03-18 DIAGNOSIS — R197 Diarrhea, unspecified: Secondary | ICD-10-CM | POA: Diagnosis not present

## 2018-03-18 DIAGNOSIS — R112 Nausea with vomiting, unspecified: Secondary | ICD-10-CM | POA: Diagnosis not present

## 2018-03-18 LAB — COMPLETE METABOLIC PANEL WITH GFR
AG Ratio: 2.2 (calc) (ref 1.0–2.5)
ALBUMIN MSPROF: 4.8 g/dL (ref 3.6–5.1)
ALT: 19 U/L (ref 6–29)
AST: 14 U/L (ref 10–35)
Alkaline phosphatase (APISO): 56 U/L (ref 33–130)
BILIRUBIN TOTAL: 1.3 mg/dL — AB (ref 0.2–1.2)
BUN: 20 mg/dL (ref 7–25)
CHLORIDE: 103 mmol/L (ref 98–110)
CO2: 29 mmol/L (ref 20–32)
Calcium: 9.9 mg/dL (ref 8.6–10.4)
Creat: 0.84 mg/dL (ref 0.50–1.05)
GFR, EST AFRICAN AMERICAN: 89 mL/min/{1.73_m2} (ref >=60)
GFR, EST NON AFRICAN AMERICAN: 77 mL/min/{1.73_m2} (ref >=60)
GLOBULIN: 2.2 g/dL (ref 1.9–3.7)
Glucose, Bld: 116 mg/dL — ABNORMAL HIGH (ref 65–99)
POTASSIUM: 4.3 mmol/L (ref 3.5–5.3)
SODIUM: 139 mmol/L (ref 135–146)
TOTAL PROTEIN: 7 g/dL (ref 6.1–8.1)

## 2018-03-18 LAB — CBC WITH DIFFERENTIAL/PLATELET
Basophils Absolute: 39 cells/uL (ref 0–200)
Basophils Relative: 0.6 %
EOS PCT: 0.9 %
Eosinophils Absolute: 59 cells/uL (ref 15–500)
HEMATOCRIT: 38 % (ref 35.0–45.0)
Hemoglobin: 13.1 g/dL (ref 11.7–15.5)
Lymphs Abs: 2379 cells/uL (ref 850–3900)
MCH: 29.7 pg (ref 27.0–33.0)
MCHC: 34.5 g/dL (ref 32.0–36.0)
MCV: 86.2 fL (ref 80.0–100.0)
MONOS PCT: 6.3 %
MPV: 10.7 fL (ref 7.5–12.5)
NEUTROS PCT: 55.6 %
Neutro Abs: 3614 cells/uL (ref 1500–7800)
PLATELETS: 185 10*3/uL (ref 140–400)
RBC: 4.41 10*6/uL (ref 3.80–5.10)
RDW: 13.4 % (ref 11.0–15.0)
TOTAL LYMPHOCYTE: 36.6 %
WBC mixed population: 410 cells/uL (ref 200–950)
WBC: 6.5 10*3/uL (ref 3.8–10.8)

## 2018-03-18 MED ORDER — DICYCLOMINE HCL 10 MG PO CAPS: 10.0000 mg | ORAL_CAPSULE | Freq: Three times a day (TID) | ORAL | 0 refills | Status: DC

## 2018-03-18 MED ORDER — DOXYCYCLINE HYCLATE 100 MG PO TABS: 100.0000 mg | ORAL_TABLET | Freq: Two times a day (BID) | ORAL | 0 refills | Status: DC

## 2018-03-18 MED ORDER — ONDANSETRON HCL 4 MG PO TABS: 4.0000 mg | ORAL_TABLET | Freq: Three times a day (TID) | ORAL | 0 refills | Status: DC | PRN

## 2018-03-18 NOTE — Patient Instructions (Addendum)
Thank you for coming to the office today.  Uncertain exact diagnosis, may be viral gastroenteritis stomach bug, or can be food borne illness  Lastly possible to be tick - concerned for some symptoms of [facility] Spotted Fever but you have no actual fever, so this is less likely  Will check blood work to test hydration and for infection  Imodium OTC: Oral: Initial: 4 mg, followed by 2 mg after each loose stool (maximum: 16 mg/day)  Start Zofran increase dose 1 to 2 pills every 8 hours as needed for nausea  Start Dicyclomine cramping medicine - take one up to 3-4 times a day usually with meals as needed  - Important to continue hydration, may start adding some Pedialyte or G2 Gatorade or Apple Juice mixed with water (equal parts, 1:1 mixture). Sometimes small sips is better than larger amounts at once, can try tablespoon every few minutes.  If not improved within 24-48 hours or develop rash and concern for possible tick illness - go ahead and start Doxycycline antibiotic 157m twice daily, stay upright or seated 30 min after take with full glass of water avoid throat irritation  If worsening, concern dehydration fever rash, reduced urine then may go to hospital emergency department for IV fluids if need  Please schedule a Follow-up Appointment to: Return in about 1 week (around 03/25/2018), or if symptoms worsen or fail to improve, for gastroenteritis vs tick.  If you have any other questions or concerns, please feel free to call the office or send a message through [place]. You may also schedule an earlier appointment if necessary.  Additionally, you may be receiving a survey about your experience at our office within a few days to 1 week by e-mail or mail. We value your feedback.  Nobie Putnam, DO [place]

## 2018-03-18 NOTE — Progress Notes (Addendum)
Subjective:    Patient ID: Andrea Mcdonald, female    DOB: 26-May-1961, 57 y.o.   MRN: [id number]  Andrea Mcdonald is a 57 y.o. female presenting on 03/18/2018 for Nausea (fatigue, chills , HA stay hydrated as per patient onset 5 days) and Diarrhea  Patient presents for a same day appointment.  HPI   ACUTE GASTROENTERITIS / nausea vomiting / diarrhea / headache / chills  TICK BITE, Left Upper Arm Reports symptoms started about 4 days ago with acute onset nausea vomiting and diarrhea, with fatigue and chills without documented fever. Then 2 days later she developed headache frontal bilateral, she still has severe nausea but without vomiting, and some diarrhea. No other sick contacts, others that ate the same food are fine. - Tried some prior old Zofran rx 4mg  about 1 x daily for few days with mild relief only - Taking Trulicity 0.75mg  on Sundays, she was doing well on this initially mild nausea, now she took dose before getting sick, but does not think this is related  Recent history of Tick Bite about 8 days ago, left forearm, thinks only on there for few hours, stated tick had a white dot on it, she thinks it was embedded had to pull it out, did not cause rash or red raised area.  Admits one episode of fecal incontinence overnight, accidental Admits significant fatigue and tired Admits abdominal cramping intermittent worsening - poor PO, drinking some sprite and water but very poor appetite Denies any fevers documented, generalized rash, myalgias, dyspnea, cough or sinus congestion, chest pain, reduced urinary frequency and amount, dysuria, hematuria, flank pain, numbness weakness vision changes   Depression screen [facility] 2/9 03/18/2018 01/15/2018 10/14/2017  Decreased Interest 0 0 1  Down, Depressed, Hopeless 0 0 2  PHQ - 2 Score 0 0 3  Altered sleeping - - 2  Tired, decreased energy - - 2  Change in appetite - - 1  Feeling bad or failure about yourself  - - 1  Trouble  concentrating - - 1  Moving slowly or fidgety/restless - - 0  Suicidal thoughts - - 0  PHQ-9 Score - - 10  Difficult doing work/chores - - Somewhat difficult   Past Surgical History:  Procedure Laterality Date  . AUGMENTATION MAMMAPLASTY    . ESOPHAGOGASTRIC FUNDOPLICATION  3474   at [facility], for GERD  . KIDNEY STONE SURGERY    . LITHOTRIPSY    . NECK SURGERY     x2  . PARTIAL HYSTERECTOMY    . PLACEMENT OF BREAST IMPLANTS      Social History   Tobacco Use  . Smoking status: Never Smoker  . Smokeless tobacco: Never Used  Substance Use Topics  . Alcohol use: Yes    Comment: drink occ  . Drug use: No    Review of Systems Per HPI unless specifically indicated above     Objective:    BP 101/61   Pulse 74   Temp 98.8 F (37.1 C) (Oral)   Resp 16   Ht 5\' 6"  (1.676 m)   Wt 177 lb (80.3 kg)   BMI 28.57 kg/m   Wt Readings from Last 3 Encounters:  03/18/18 177 lb (80.3 kg)  01/15/18 178 lb (80.7 kg)  10/14/17 179 lb 12.8 oz (81.6 kg)    Physical Exam  Constitutional: She is oriented to person, place, and time. She appears well-developed and well-nourished. No distress.  Mildly ill and tired appearing, uncomfortable with general fatigue, cooperative  HENT:  Head: Normocephalic and atraumatic.  Mouth/Throat: Oropharynx is clear and moist.  Frontal / maxillary sinuses non-tender. Nares patent without purulence or edema. Oropharynx clear without erythema, exudates, edema or asymmetry.  Mucus membranes still mildly moist, not dry.  Eyes: Conjunctivae are normal. Right eye exhibits no discharge. Left eye exhibits no discharge.  Neck: Normal range of motion. Neck supple.  Cardiovascular: Normal rate, regular rhythm, normal heart sounds and intact distal pulses.  No murmur heard. Pulmonary/Chest: Effort normal and breath sounds normal. No respiratory distress. She has no wheezes. She has no rales.  Abdominal: Soft. Bowel sounds are normal. She exhibits no distension. There  is no tenderness. There is no rebound and no guarding.  Abdominal cramping is not reproduced by palpation on exam. Area is generalized.  No RLQ tenderness on exam and no rebound.  Musculoskeletal: Normal range of motion. She exhibits no edema.  Lymphadenopathy:    She has no cervical adenopathy.  Neurological: She is alert and oriented to person, place, and time.  Skin: Skin is warm and dry. Capillary refill takes less than 2 seconds. Normal. No rash noted. She is not diaphoretic. No erythema.  Location of tick bite left upper arm with < 1 cm minimal erythema location of induration at bite site otherwise no extending erythema or rash. No erythema migrans, non tender, no drainage or abscess  Psychiatric: She has a normal mood and affect. Her behavior is normal.  Well groomed, good eye contact, normal speech and thoughts  Nursing note and vitals reviewed.  Results for orders placed or performed in visit on 03/18/18 (from the past 24 hour(s))  COMPLETE METABOLIC PANEL WITH GFR     Status: Abnormal   Collection Time: 03/18/18 10:33 AM  Result Value Ref Range   Glucose, Bld 116 (H) 65 - 99 mg/dL   BUN 20 7 - 25 mg/dL   Creat 0.84 0.50 - 1.05 mg/dL   GFR, Est Non African American 77 > OR = 60 mL/min/1.71m2   GFR, Est African American 89 > OR = 60 mL/min/1.3m2   BUN/Creatinine Ratio NOT APPLICABLE 6 - 22 (calc)   Sodium 139 135 - 146 mmol/L   Potassium 4.3 3.5 - 5.3 mmol/L   Chloride 103 98 - 110 mmol/L   CO2 29 20 - 32 mmol/L   Calcium 9.9 8.6 - 10.4 mg/dL   Total Protein 7.0 6.1 - 8.1 g/dL   Albumin 4.8 3.6 - 5.1 g/dL   Globulin 2.2 1.9 - 3.7 g/dL (calc)   AG Ratio 2.2 1.0 - 2.5 (calc)   Total Bilirubin 1.3 (H) 0.2 - 1.2 mg/dL   Alkaline phosphatase (APISO) 56 33 - 130 U/L   AST 14 10 - 35 U/L   ALT 19 6 - 29 U/L  CBC with Differential/Platelet     Status: None   Collection Time: 03/18/18 10:33 AM  Result Value Ref Range   WBC 6.5 3.8 - 10.8 Thousand/uL   RBC 4.41 3.80 - 5.10  Million/uL   Hemoglobin 13.1 11.7 - 15.5 g/dL   HCT 38.0 35.0 - 45.0 %   MCV 86.2 80.0 - 100.0 fL   MCH 29.7 27.0 - 33.0 pg   MCHC 34.5 32.0 - 36.0 g/dL   RDW 13.4 11.0 - 15.0 %   Platelets 185 140 - 400 Thousand/uL   MPV 10.7 7.5 - 12.5 fL   Neutro Abs 3,614 1,500 - 7,800 cells/uL   Lymphs Abs 2,379 850 - 3,900 cells/uL   WBC  mixed population 410 200 - 950 cells/uL   Eosinophils Absolute 59 15 - 500 cells/uL   Basophils Absolute 39 0 - 200 cells/uL   Neutrophils Relative % 55.6 %   Total Lymphocyte 36.6 %   Monocytes Relative 6.3 %   Eosinophils Relative 0.9 %   Basophils Relative 0.6 %        Assessment & Plan:   Problem List Items Addressed This Visit    None    Visit Diagnoses    Generalized abdominal pain    -  Primary   Relevant Medications   dicyclomine (BENTYL) 10 MG capsule   Other Relevant Orders   COMPLETE METABOLIC PANEL WITH GFR (Completed)   CBC with Differential/Platelet (Completed)   Nausea and vomiting, intractability of vomiting not specified, unspecified vomiting type       Relevant Medications   ondansetron (ZOFRAN) 4 MG tablet   Other Relevant Orders   COMPLETE METABOLIC PANEL WITH GFR (Completed)   CBC with Differential/Platelet (Completed)   Diarrhea, unspecified type       Relevant Medications   dicyclomine (BENTYL) 10 MG capsule   Other Relevant Orders   COMPLETE METABOLIC PANEL WITH GFR (Completed)   CBC with Differential/Platelet (Completed)   Tick bite of left upper arm, initial encounter       Relevant Medications   doxycycline (VIBRA-TABS) 100 MG tablet      Uncertain exact etiology for her acute syndrome of symptoms, primarily GI. Concern for viral gastroenteritis given both nausea vomiting and diarrhea. Possible but less likely to be food borne illness, given others not sick. - Additionally considered tick borne illness lyme vs RMSF however she is afebrile, and no rash, no erythema migrans - Possible Trulicity side effect given  onset same day as last dose Sunday but she has tolerated it well for long time now without side effects after initial nausea has resolved - No known sick contacts - By history s/p appendix removal, GERD surgery, partial hysterectomy and kidney stone surgery - Currently tired but still fairly well appearing and non-toxic, clinically mostly well hydrated on exam, benign abdomen  Plan: - CHECK STAT Labs - CMET, CBC - will review results today 1. Reassurance, likely self-limited 7-10 days - New rx refill Zofran 4mg  tabs - take 1-2 per dose now q 8 hr PRN for nausea - May try Imodium OTC dosing given per AVS - Rx Dicyclomine PRN cramping symptom relief 2. Continue PO as tolerated. May try to increase hydration, clear fluids (gatorade, pedialyate, water mix apple juice rehydration) - May take Tylenol / NSAID PRN - Discussed possible tick borne illnesses, deferred serology test due to about 7 days only since bite and < 1 week since onset symptoms, recommended time interval is 10 to 21 days for checking, will reconsider if unresolved or other changes - However given timing of symptoms and patient unavailable for follow-up soon / travel and long weekend, agreed to print rx Doxycycline 100mg  BID for 10 days, as a precautionary option ONLY FILL if she is not improved within 48 hours or develop rash or other symptoms  Return criteria reviewed, strict advice when to go to Hospital ED for eval if dehydrated, new or worsening symptoms, especially fever, and may need IVF   **UPDATE after visit 03/18/18** approx 4:00pm - Reviewed STAT lab results, reassuring, I called her and reviewed results with her. Answered her questions. She does not seem to be worsening. Agrees to take current symptomatic medicines as prescribed, may start  doxycycline for potential but less likely tickborne illness, and will follow-up or go seek additional medical attention more immediately if acute worsening at hospital ED.   Meds  ordered this encounter  Medications  . ondansetron (ZOFRAN) 4 MG tablet    Sig: Take 1-2 tablets (4-8 mg total) by mouth every 8 (eight) hours as needed for nausea or vomiting.    Dispense:  30 tablet    Refill:  0  . dicyclomine (BENTYL) 10 MG capsule    Sig: Take 1 capsule (10 mg total) by mouth 4 (four) times daily -  before meals and at bedtime.    Dispense:  30 capsule    Refill:  0  . doxycycline (VIBRA-TABS) 100 MG tablet    Sig: Take 1 tablet (100 mg total) by mouth 2 (two) times daily. If not improved or get rash, For 10 days. Take with full glass of water, stay upright 30 min after taking.    Dispense:  20 tablet    Refill:  0    Follow up plan: Return in about 1 week (around 03/25/2018), or if symptoms worsen or fail to improve, for gastroenteritis vs tick.  Nobie Putnam, [facility] 03/18/2018, 4:00 PM

## 2018-03-29 ENCOUNTER — Ambulatory Visit (INDEPENDENT_AMBULATORY_CARE_PROVIDER_SITE_OTHER): Payer: Managed Care, Other (non HMO) | Admitting: Vascular Surgery

## 2018-03-29 ENCOUNTER — Encounter (INDEPENDENT_AMBULATORY_CARE_PROVIDER_SITE_OTHER): Payer: Self-pay | Admitting: Vascular Surgery

## 2018-03-29 VITALS — BP 91/65 | HR 79 | Resp 15 | Ht 66.0 in | Wt 179.0 lb

## 2018-03-29 DIAGNOSIS — I10 Essential (primary) hypertension: Secondary | ICD-10-CM

## 2018-03-29 DIAGNOSIS — M79604 Pain in right leg: Secondary | ICD-10-CM

## 2018-03-29 DIAGNOSIS — E1142 Type 2 diabetes mellitus with diabetic polyneuropathy: Secondary | ICD-10-CM | POA: Diagnosis not present

## 2018-03-29 DIAGNOSIS — M79605 Pain in left leg: Secondary | ICD-10-CM

## 2018-03-29 DIAGNOSIS — I872 Venous insufficiency (chronic) (peripheral): Secondary | ICD-10-CM

## 2018-04-02 ENCOUNTER — Encounter (INDEPENDENT_AMBULATORY_CARE_PROVIDER_SITE_OTHER): Payer: Self-pay | Admitting: Vascular Surgery

## 2018-04-02 DIAGNOSIS — I872 Venous insufficiency (chronic) (peripheral): Secondary | ICD-10-CM | POA: Insufficient documentation

## 2018-04-02 DIAGNOSIS — M79606 Pain in leg, unspecified: Secondary | ICD-10-CM | POA: Insufficient documentation

## 2018-04-02 NOTE — Progress Notes (Signed)
MRN : [id number]  Andrea Mcdonald is a 57 y.o. (Jan 26, 1961) female who presents with chief complaint of  Chief Complaint  Patient presents with  . Follow-up    Vein issues  .  History of Present Illness: Patient is seen for evaluation of leg pain and leg swelling. The patient first noticed the swelling remotely. The swelling is associated with pain and discoloration. The pain and swelling worsens with prolonged dependency and improves with elevation. The pain is unrelated to activity.  She describes the pain as a pins and need;es of the feet especially the bottoms.  The patient notes that in the morning the legs are significantly improved but they steadily worsened throughout the course of the day. The patient also notes a steady worsening of the discoloration in the ankle and shin area.   The patient denies claudication symptoms.  The patient denies symptoms consistent with rest pain.  The patient has a  history of DJD and LS spine disease.  The patient has not had any past angiography, interventions or vascular surgery.  Elevation makes the leg symptoms better, dependency makes them much worse. There is no history of ulcerations. The patient denies any recent changes in medications.  The patient has not been wearing graduated compression.  The patient denies a history of DVT or PE. There is no prior history of phlebitis. There is no history of primary lymphedema.  No history of malignancies. No history of trauma or groin or pelvic surgery. There is no history of radiation treatment to the groin or pelvis  The patient denies amaurosis fugax or recent TIA symptoms. There are no recent neurological changes noted. The patient denies recent episodes of angina or shortness of breath  Current Meds  Medication Sig  . aspirin 81 MG chewable tablet Chew 81 mg by mouth at bedtime.  . dicyclomine (BENTYL) 10 MG capsule Take 1 capsule (10 mg total) by mouth 4 (four) times daily -  before  meals and at bedtime.  . Dulaglutide (TRULICITY) 4.09 WJ/1.9JY SOPN Inject 0.75 mg into the skin once a week.  . fluticasone (FLONASE) 50 MCG/ACT nasal spray Place 2 sprays into both nostrils daily.  Marland Kitchen lisinopril (PRINIVIL,ZESTRIL) 5 MG tablet Take 1 tablet (5 mg total) by mouth daily.  Marland Kitchen loratadine (CLARITIN) 10 MG tablet Take 1 tablet (10 mg total) by mouth daily.  . metFORMIN (GLUCOPHAGE-XR) 500 MG 24 hr tablet Take 2 tabs with breakfast and 1 tab with dinner, for total 3 daily.  . ondansetron (ZOFRAN) 4 MG tablet Take 1-2 tablets (4-8 mg total) by mouth every 8 (eight) hours as needed for nausea or vomiting.  Marland Kitchen PAZEO 0.7 % SOLN   . rosuvastatin (CRESTOR) 20 MG tablet TAKE 1 TABLET BY MOUTH AT BEDTIME  . sertraline (ZOLOFT) 100 MG tablet Take 1.5 tablets (150 mg total) by mouth at bedtime.    Past Medical History:  Diagnosis Date  . High cholesterol   . Hypertension 05/25/2013  . Plantar fasciitis, bilateral 05/25/2013    Past Surgical History:  Procedure Laterality Date  . AUGMENTATION MAMMAPLASTY    . ESOPHAGOGASTRIC FUNDOPLICATION  7829   at [facility] for GERD  . KIDNEY STONE SURGERY    . LITHOTRIPSY    . NECK SURGERY     x2  . PARTIAL HYSTERECTOMY    . PLACEMENT OF BREAST IMPLANTS      Social History Social History   Tobacco Use  . Smoking status: Never Smoker  . Smokeless  tobacco: Never Used  Substance Use Topics  . Alcohol use: Yes    Comment: drink occ  . Drug use: No    Family History Family History  Problem Relation Age of Onset  . Heart disease Mother   . Depression Mother   . Anxiety disorder Mother   . Breast cancer Neg Hx     Allergies  Allergen Reactions  . Codeine Itching     REVIEW OF SYSTEMS (Negative unless checked)  Constitutional: [] Weight loss  [] Fever  [] Chills Cardiac: [] Chest pain   [] Chest pressure   [] Palpitations   [] Shortness of breath when laying flat   [] Shortness of breath with exertion. Vascular:  [] Pain in legs with walking    [x] Pain in legs at rest  [] History of DVT   [] Phlebitis   [x] Swelling in legs   [x] Varicose veins   [] Non-healing ulcers Pulmonary:   [] Uses home oxygen   [] Productive cough   [] Hemoptysis   [] Wheeze  [] COPD   [] Asthma Neurologic:  [] Dizziness   [] Seizures   [] History of stroke   [] History of TIA  [] Aphasia   [] Vissual changes   [] Weakness or numbness in arm   [x] Numbness in leg Musculoskeletal:   [] Joint swelling   [] Joint pain   [] Low back pain Hematologic:  [] Easy bruising  [] Easy bleeding   [] Hypercoagulable state   [] Anemic Gastrointestinal:  [] Diarrhea   [] Vomiting  [] Gastroesophageal reflux/heartburn   [] Difficulty swallowing. Genitourinary:  [] Chronic kidney disease   [] Difficult urination  [] Frequent urination   [] Blood in urine Skin:  [] Rashes   [] Ulcers  Psychological:  [] History of anxiety   []  History of major depression.  Physical Examination  Vitals:   03/29/18 1536  BP: 91/65  Pulse: 79  Resp: 15  Weight: 179 lb (81.2 kg)  Height: 5\' 6"  (1.676 m)   Body mass index is 28.89 kg/m. Gen: WD/WN, NAD Head: [place]/AT, No temporalis wasting.  Ear/Nose/Throat: Hearing grossly intact, nares w/o erythema or drainage Eyes: PER, EOMI, sclera nonicteric.  Neck: Supple, no large masses.   Pulmonary:  Good air movement, no audible wheezing bilaterally, no use of accessory muscles.  Cardiac: RRR, no JVD Vascular: scattered varicosities present bilaterally.  Mild venous stasis changes to the legs bilaterally.  2+ soft pitting edema Vessel Right Left  Radial Palpable Palpable  PT Palpable Palpable  DP Palpable Palpable  Gastrointestinal: Non-distended. No guarding/no peritoneal signs.  Musculoskeletal: M/S 5/5 throughout.  No deformity or atrophy.  Neurologic: CN 2-12 intact. Symmetrical.  Speech is fluent. Motor exam as listed above. Psychiatric: Judgment intact, Mood & affect appropriate for pt's clinical situation. Dermatologic: No rashes or ulcers noted.  No changes consistent  with cellulitis. Lymph : No lichenification or skin changes of chronic lymphedema.  CBC Lab Results  Component Value Date   WBC 6.5 03/18/2018   HGB 13.1 03/18/2018   HCT 38.0 03/18/2018   MCV 86.2 03/18/2018   PLT 185 03/18/2018    BMET    Component Value Date/Time   NA 139 03/18/2018 1033   NA 144 12/11/2015 1428   NA 139 10/29/2014 1910   K 4.3 03/18/2018 1033   K 4.5 10/29/2014 1910   CL 103 03/18/2018 1033   CL 102 10/29/2014 1910   CO2 29 03/18/2018 1033   CO2 32 10/29/2014 1910   GLUCOSE 116 (H) 03/18/2018 1033   GLUCOSE 169 (H) 10/29/2014 1910   BUN 20 03/18/2018 1033   BUN 15 12/11/2015 1428   BUN 16 10/29/2014 1910  CREATININE 0.84 03/18/2018 1033   CALCIUM 9.9 03/18/2018 1033   CALCIUM 9.7 10/29/2014 1910   GFRNONAA 77 03/18/2018 1033   GFRAA 89 03/18/2018 1033   Estimated Creatinine Clearance: 79.4 mL/min (by C-G formula based on SCr of 0.84 mg/dL).  COAG No results found for: INR, PROTIME  Radiology No results found.    Assessment/Plan 1. Pain in both lower extremities  Recommend:  The patient has atypical pain symptoms for vascular disease. However, on physical exam there is evidence of venous disease, given the strongly palpable pulses and the edema associated with venous changes of the legs.  Noninvasive studies including venous ultrasound of the legs will be obtained and the patient will follow up with me to review these studies.  Further work up of her neuropathy and possible LS spine disease is deferred to the primary service  The patient should continue walking and begin a more formal exercise program.  The patient should continue his antiplatelet therapy and aggressive treatment of the lipid abnormalities.  The patient should begin wearing graduated compression socks 15-20 mmHg strength to control edema.   - VAS [place] LOWER EXTREMITY VENOUS REFLUX; Future  2. Chronic venous insufficiency See #1  3. Essential hypertension Continue  antihypertensive medications as already ordered, these medications have been reviewed and there are no changes at this time.   4. Type 2 diabetes mellitus with diabetic polyneuropathy, without long-term current use of insulin (HCC) Continue hypoglycemic medications as already ordered, these medications have been reviewed and there are no changes at this time.  Hgb A1C to be monitored as already arranged by primary service    Hortencia Pilar, MD  04/02/2018 5:26 AM

## 2018-04-13 ENCOUNTER — Other Ambulatory Visit: Payer: Self-pay

## 2018-04-13 DIAGNOSIS — E1165 Type 2 diabetes mellitus with hyperglycemia: Principal | ICD-10-CM

## 2018-04-13 DIAGNOSIS — IMO0001 Reserved for inherently not codable concepts without codable children: Secondary | ICD-10-CM

## 2018-04-14 ENCOUNTER — Other Ambulatory Visit: Payer: Managed Care, Other (non HMO)

## 2018-04-15 LAB — HEMOGLOBIN A1C
EAG (MMOL/L): 8.5 (calc)
Hgb A1c MFr Bld: 7 % of total Hgb — ABNORMAL HIGH (ref <5.7)
MEAN PLASMA GLUCOSE: 154 (calc)

## 2018-04-20 ENCOUNTER — Other Ambulatory Visit: Payer: Self-pay | Admitting: Family Medicine

## 2018-04-20 ENCOUNTER — Encounter: Payer: Self-pay | Admitting: Family Medicine

## 2018-04-20 ENCOUNTER — Ambulatory Visit: Payer: Managed Care, Other (non HMO) | Admitting: Family Medicine

## 2018-04-20 VITALS — BP 96/55 | HR 82 | Resp 16 | Ht 66.0 in | Wt 180.0 lb

## 2018-04-20 DIAGNOSIS — E1169 Type 2 diabetes mellitus with other specified complication: Secondary | ICD-10-CM

## 2018-04-20 DIAGNOSIS — F329 Major depressive disorder, single episode, unspecified: Secondary | ICD-10-CM

## 2018-04-20 DIAGNOSIS — F418 Other specified anxiety disorders: Secondary | ICD-10-CM

## 2018-04-20 DIAGNOSIS — G2581 Restless legs syndrome: Secondary | ICD-10-CM

## 2018-04-20 DIAGNOSIS — R1011 Right upper quadrant pain: Secondary | ICD-10-CM

## 2018-04-20 DIAGNOSIS — F32A Depression, unspecified: Secondary | ICD-10-CM

## 2018-04-20 DIAGNOSIS — Z0000 Encounter for general adult medical examination without abnormal findings: Secondary | ICD-10-CM

## 2018-04-20 DIAGNOSIS — IMO0001 Reserved for inherently not codable concepts without codable children: Secondary | ICD-10-CM

## 2018-04-20 DIAGNOSIS — J301 Allergic rhinitis due to pollen: Secondary | ICD-10-CM | POA: Diagnosis not present

## 2018-04-20 DIAGNOSIS — E538 Deficiency of other specified B group vitamins: Secondary | ICD-10-CM

## 2018-04-20 DIAGNOSIS — R110 Nausea: Secondary | ICD-10-CM

## 2018-04-20 DIAGNOSIS — E559 Vitamin D deficiency, unspecified: Secondary | ICD-10-CM

## 2018-04-20 DIAGNOSIS — E785 Hyperlipidemia, unspecified: Secondary | ICD-10-CM

## 2018-04-20 DIAGNOSIS — I10 Essential (primary) hypertension: Secondary | ICD-10-CM

## 2018-04-20 DIAGNOSIS — E1165 Type 2 diabetes mellitus with hyperglycemia: Secondary | ICD-10-CM

## 2018-04-20 MED ORDER — ROPINIROLE HCL 0.25 MG PO TABS: 0.2500 mg | ORAL_TABLET | Freq: Every day | ORAL | 1 refills | Status: DC

## 2018-04-20 MED ORDER — ROSUVASTATIN CALCIUM 20 MG PO TABS: 20.0000 mg | ORAL_TABLET | Freq: Every day | ORAL | 3 refills | Status: DC

## 2018-04-20 MED ORDER — LORATADINE 10 MG PO TABS: 10.0000 mg | ORAL_TABLET | Freq: Every day | ORAL | 11 refills | Status: DC

## 2018-04-20 MED ORDER — SERTRALINE HCL 100 MG PO TABS: 150.0000 mg | ORAL_TABLET | Freq: Every day | ORAL | 11 refills | Status: DC

## 2018-04-20 NOTE — Patient Instructions (Addendum)
Thank you for coming to the office today.  Continue Trulicity 7.29  M2X today is 7.0 - great result  Keep improving diet / exercise lifestyle  REDUCE Metformin XR from 1000 or two in morning down to ONE in morning and ONE in evening  Restless legs syndrome (RLS): Oral: Immediate-release tablets: Initial: 0.25 mg once daily 1 to 3 hours before bedtime. Dose may be increased after 2 days to 0.5 mg daily, and after 7 days to 1 mg daily.  Max is 4 pills at bed - if this is helping and need more refilled - call us and we can send a new rx 1 mg pills for ONE pill at bedtime  Ordered Abdominal Ultrasound for follow-up stay tuned  DUE for FASTING BLOOD WORK (no food or drink after midnight before the lab appointment, only water or coffee without cream/sugar on the morning of)  SCHEDULE "Lab Only" visit in the morning at the clinic for lab draw in  3 MONTHS   - Make sure Lab Only appointment is at about 1 week before your next appointment, so that results will be available  For Lab Results, once available within 2-3 days of blood draw, you can can log in to MyChart online to view your results and a brief explanation. Also, we can discuss results at next follow-up visit.   Please schedule a Follow-up Appointment to: Return in about 3 months (around 07/21/2018) for Annual Physical.  If you have any other questions or concerns, please feel free to call the office or send a message through [place]. You may also schedule an earlier appointment if necessary.  Additionally, you may be receiving a survey about your experience at our office within a few days to 1 week by e-mail or mail. We value your feedback.  Nobie Putnam, DO [place]

## 2018-04-20 NOTE — Progress Notes (Signed)
Subjective:    Patient ID: Andrea Mcdonald, female    DOB: 05-07-1961, 57 y.o.   MRN: [id number]  Andrea Mcdonald is a 57 y.o. female presenting on 04/20/2018 for Diabetes (discuss A1c from last done-refill meds ); Hyperlipidemia (refills); Anxiety (refill sertaline ); Allergies (refill eye drops and claritin ); and Fatigue (Exhausted all day wonders if it is vitamin issue- Last Vit D 2017 was 30.5 and B12 was 274)   HPI   CHRONIC DM, Type 2 Last visit 01/15/18 for Diabetes, she was discontinued on Farxiga d/t side effect and started on Trulicity 0.75mg  weekly injection at that time, she had prior A1c 8.6 to 9, see note for background information. - Interval result, now A1c down to 7.0 - Today she reports overall pleased with sugar improvement, thinks trulicity is controlling sugar much better, she has concerns about abdomen and nausea, thinks some is side effect on trulicity CBGs: fasting AM CBG  Meds:MetforminXR500mg  x 2AM and x 1 PM, Trulicity 0.75mg  [place] weekly inj - Prior meds on Tresiba and Victoza Reports good compliancenow improved tolerating well, except occasional GI intolerance metformin Currently on ACEi Lifestyle: - Diet (improving diet, now reduced portion size and appetite due to abdominal symptoms) - Exercise (No regular exercise, walking at work) Admits still has some episodes of loose stool Denies hypoglycemia  Nausea / RUQ Abdominal Pain Chronic problem now over weeks, recently seen 03/18/18 for same issue, among other symptoms, see note, has failed trial of dicyclomine without relief, improve on zofran, seems worse on trulicity for few days then improve may be side effect. She has prior history of RUQ pain in past, asking about updated ultrasound.  Restless Leg Syndrome / Reduced Energy vs Fatigue Reports additional problem today feeling tired and exhausted, admits some poor sleep but does not actually wake up or have sleep apnea symptoms. She had prior sleep  study >5 years ago, she was told that she does not have OSA but has RLS, never treated for this - She admits some daytime sleepiness, but mostly symptoms are related to poor energy - Bedtime is 8-9pm does not usually have problem falling asleep has some sensation in legs has to move them to feel better = Additionally asking about Vitamin testing, prior history of Low Vitamin D and B12, last labs 2016, due for re-check not taking medicine - Admits snoring - Declined ESS questionnaire today - will reconsider in future  Additionally needs refills - including Sertraline, continues this, helps control mood   Depression screen [facility] 2/9 04/20/2018 03/18/2018 01/15/2018  Decreased Interest 0 0 0  Down, Depressed, Hopeless 0 0 0  PHQ - 2 Score 0 0 0  Altered sleeping 0 - -  Tired, decreased energy 3 - -  Change in appetite 0 - -  Feeling bad or failure about yourself  0 - -  Trouble concentrating 1 - -  Moving slowly or fidgety/restless 0 - -  Suicidal thoughts 0 - -  PHQ-9 Score 4 - -  Difficult doing work/chores - - -    Social History   Tobacco Use  . Smoking status: Never Smoker  . Smokeless tobacco: Never Used  Substance Use Topics  . Alcohol use: Yes    Comment: drink occ  . Drug use: No    Review of Systems Per HPI unless specifically indicated above     Objective:    BP (!) 96/55   Pulse 82   Resp 16   Ht 5'  6" (1.676 m)   Wt 180 lb (81.6 kg)   SpO2 98%   BMI 29.05 kg/m   Wt Readings from Last 3 Encounters:  04/20/18 180 lb (81.6 kg)  03/29/18 179 lb (81.2 kg)  03/18/18 177 lb (80.3 kg)    Physical Exam  Constitutional: She is oriented to person, place, and time. She appears well-developed and well-nourished. No distress.  Well-appearing, comfortable, cooperative  HENT:  Head: Normocephalic and atraumatic.  Mouth/Throat: Oropharynx is clear and moist.  Eyes: Conjunctivae are normal. Right eye exhibits no discharge. Left eye exhibits no discharge.    Cardiovascular: Normal rate.  Pulmonary/Chest: Effort normal.  Abdominal: Soft. Bowel sounds are normal. She exhibits no distension and no mass. There is no tenderness. There is no guarding.  Tender RUQ without other associated pain, negative Murphy's  Musculoskeletal: She exhibits no edema.  Neurological: She is alert and oriented to person, place, and time.  Skin: Skin is warm and dry. No rash noted. She is not diaphoretic. No erythema.  Psychiatric: She has a normal mood and affect. Her behavior is normal.  Well groomed, good eye contact, normal speech and thoughts  Nursing note and vitals reviewed.    Results for orders placed or performed in visit on 04/13/18  Hemoglobin A1c  Result Value Ref Range   Hgb A1c MFr Bld 7.0 (H) <5.7 % of total Hgb   Mean Plasma Glucose 154 (calc)   eAG (mmol/L) 8.5 (calc)   Recent Labs    10/14/17 1615 01/15/18 2254 04/14/18 0000  HGBA1C 8.8* 8.6* 7.0*      Assessment & Plan:   Problem List Items Addressed This Visit    Allergic rhinitis    refill      Relevant Medications   loratadine (CLARITIN) 10 MG tablet   Restless leg syndrome    Clinically suspicious for RLS also poor sleep, and prior sleep study by report >5 yr ago w/ dx Trial on Ropinrole 0.25mg  titrate dose up to 1mg  by 1-2 weeks, then gradual increase as instructed - call if questions with dosing - Rx sent can increase # or dose in future - Follow-up  Defer repeat PSG at this time      Relevant Medications   rOPINIRole (REQUIP) 0.25 MG tablet   RUQ abdominal pain   Relevant Orders   US Abdomen Limited RUQ   Type 2 diabetes mellitus with other specified complication ([place]) - Primary    Dramatically improved DM control now A1c 7.0, with previous hyperglycemia On Trulicity GLP and off SGLT2 Previous hypoglycemia, none recently Improved lifestyle Failed Victoza, Lisabeth Pick  Additionally reviewed given GI symptoms persistent, and prior poor DM control, may be at  risk for possible DM gastroparesis - will consider in future if unremarkable testing and symptoms  Plan:  1. Discussion on med adjust - agree may have some GI intolerance side effect on both metformin and GLP Trulicity - but for now will investigate other causes and continue to treat DM - Continue Trulicity 0.75mg  weekly [place] injection - refill - will not increase at this time avoid side effect - REDUCE Metformin XR from 1000mg  AM (x 2 pills) down to 1 pill for 500mg  AM and continue 500mg  PM - reduce GI intolerance - Encourage improved lifestyle - emphasized importance low carb, low sugar diet, reduce portion size, regular exercise - Follow-up 3 months annual labs and a1c      Relevant Medications   metFORMIN (GLUCOPHAGE-XR) 500 MG 24 hr tablet  rosuvastatin (CRESTOR) 20 MG tablet    Other Visit Diagnoses    Depression, unspecified depression type       Relevant Medications   sertraline (ZOLOFT) 100 MG tablet   Hyperlipidemia, unspecified hyperlipidemia type       Relevant Medications   rosuvastatin (CRESTOR) 20 MG tablet   Nausea       Relevant Orders   US Abdomen Limited RUQ      RUQ Pain / Nausea Will check RUQ Abd [place] for rule out cholelithiasis, and other etiology for symptoms If testing is negative and upcoming labs reassuring, symptoms improve then monitor - can adjust meds to reduce side effect, otherwise may need referral to GI for possible gastroparesis work-up  Meds ordered this encounter  Medications  . rOPINIRole (REQUIP) 0.25 MG tablet    Sig: Take 1-4 tablets (0.25-1 mg total) by mouth at bedtime. 1-3 hours before bed    Dispense:  60 tablet    Refill:  1    May need new rx dose adjust next time  . sertraline (ZOLOFT) 100 MG tablet    Sig: Take 1.5 tablets (150 mg total) by mouth at bedtime.    Dispense:  45 tablet    Refill:  11  . rosuvastatin (CRESTOR) 20 MG tablet    Sig: Take 1 tablet (20 mg total) by mouth at bedtime.    Dispense:  90 tablet    Refill:   3    Please consider 90 day supplies to promote better adherence  . loratadine (CLARITIN) 10 MG tablet    Sig: Take 1 tablet (10 mg total) by mouth daily.    Dispense:  30 tablet    Refill:  11    Follow up plan: Return in about 3 months (around 07/21/2018) for Annual Physical.  Future labs ordered for 07/16/18  Nobie Putnam, [facility] 04/20/2018, 11:42 PM

## 2018-04-20 NOTE — Assessment & Plan Note (Signed)
Dramatically improved DM control now A1c 7.0, with previous hyperglycemia On Trulicity GLP and off SGLT2 Previous hypoglycemia, none recently Improved lifestyle Failed Victoza, Lisabeth Pick  Additionally reviewed given GI symptoms persistent, and prior poor DM control, may be at risk for possible DM gastroparesis - will consider in future if unremarkable testing and symptoms  Plan:  1. Discussion on med adjust - agree may have some GI intolerance side effect on both metformin and GLP Trulicity - but for now will investigate other causes and continue to treat DM - Continue Trulicity 0.75mg  weekly [place] injection - refill - will not increase at this time avoid side effect - REDUCE Metformin XR from 1000mg  AM (x 2 pills) down to 1 pill for 500mg  AM and continue 500mg  PM - reduce GI intolerance - Encourage improved lifestyle - emphasized importance low carb, low sugar diet, reduce portion size, regular exercise - Follow-up 3 months annual labs and a1c

## 2018-04-20 NOTE — Assessment & Plan Note (Signed)
refill 

## 2018-04-20 NOTE — Assessment & Plan Note (Signed)
Clinically suspicious for RLS also poor sleep, and prior sleep study by report >5 yr ago w/ dx Trial on Ropinrole 0.25mg  titrate dose up to 1mg  by 1-2 weeks, then gradual increase as instructed - call if questions with dosing - Rx sent can increase # or dose in future - Follow-up  Defer repeat PSG at this time

## 2018-04-21 ENCOUNTER — Ambulatory Visit: Payer: Managed Care, Other (non HMO) | Admitting: Family Medicine

## 2018-04-28 ENCOUNTER — Other Ambulatory Visit: Payer: Managed Care, Other (non HMO)

## 2018-05-04 ENCOUNTER — Ambulatory Visit
Admission: RE | Admit: 2018-05-04 | Discharge: 2018-05-04 | Disposition: A | Discharge status: Home / Self Care | Payer: Managed Care, Other (non HMO) | Source: Ambulatory Visit | Attending: Family Medicine | Admitting: Family Medicine

## 2018-05-04 DIAGNOSIS — R1011 Right upper quadrant pain: Secondary | ICD-10-CM | POA: Insufficient documentation

## 2018-05-04 DIAGNOSIS — R110 Nausea: Secondary | ICD-10-CM | POA: Diagnosis not present

## 2018-05-24 ENCOUNTER — Other Ambulatory Visit: Payer: Self-pay | Admitting: Family Medicine

## 2018-05-24 DIAGNOSIS — E1169 Type 2 diabetes mellitus with other specified complication: Secondary | ICD-10-CM

## 2018-05-24 MED ORDER — METFORMIN HCL ER 500 MG PO TB24: 500.0000 mg | ORAL_TABLET | Freq: Two times a day (BID) | ORAL | 3 refills | Status: DC

## 2018-06-17 ENCOUNTER — Encounter (INDEPENDENT_AMBULATORY_CARE_PROVIDER_SITE_OTHER): Payer: Managed Care, Other (non HMO)

## 2018-06-17 ENCOUNTER — Ambulatory Visit (INDEPENDENT_AMBULATORY_CARE_PROVIDER_SITE_OTHER): Payer: Managed Care, Other (non HMO) | Admitting: Vascular Surgery

## 2018-06-30 LAB — HM MAMMOGRAPHY

## 2018-07-01 LAB — HM DIABETES EYE EXAM

## 2018-07-08 ENCOUNTER — Encounter: Payer: Self-pay | Admitting: Family Medicine

## 2018-07-13 ENCOUNTER — Encounter: Payer: Self-pay | Admitting: Family Medicine

## 2018-07-16 ENCOUNTER — Other Ambulatory Visit: Payer: Managed Care, Other (non HMO)

## 2018-07-16 DIAGNOSIS — E538 Deficiency of other specified B group vitamins: Secondary | ICD-10-CM

## 2018-07-16 DIAGNOSIS — Z0000 Encounter for general adult medical examination without abnormal findings: Secondary | ICD-10-CM

## 2018-07-16 DIAGNOSIS — I10 Essential (primary) hypertension: Secondary | ICD-10-CM

## 2018-07-16 DIAGNOSIS — E785 Hyperlipidemia, unspecified: Principal | ICD-10-CM

## 2018-07-16 DIAGNOSIS — F418 Other specified anxiety disorders: Secondary | ICD-10-CM

## 2018-07-16 DIAGNOSIS — E559 Vitamin D deficiency, unspecified: Secondary | ICD-10-CM

## 2018-07-16 DIAGNOSIS — G2581 Restless legs syndrome: Secondary | ICD-10-CM

## 2018-07-16 DIAGNOSIS — E1169 Type 2 diabetes mellitus with other specified complication: Secondary | ICD-10-CM

## 2018-07-17 LAB — CBC WITH DIFFERENTIAL/PLATELET
Basophils Absolute: 32 cells/uL (ref 0–200)
Basophils Relative: 0.6 %
EOS PCT: 1.5 %
Eosinophils Absolute: 80 cells/uL (ref 15–500)
HCT: 37.5 % (ref 35.0–45.0)
Hemoglobin: 12.8 g/dL (ref 11.7–15.5)
LYMPHS ABS: 1638 {cells}/uL (ref 850–3900)
MCH: 29.8 pg (ref 27.0–33.0)
MCHC: 34.1 g/dL (ref 32.0–36.0)
MCV: 87.2 fL (ref 80.0–100.0)
MPV: 11 fL (ref 7.5–12.5)
Monocytes Relative: 6.6 %
NEUTROS ABS: 3201 {cells}/uL (ref 1500–7800)
Neutrophils Relative %: 60.4 %
PLATELETS: 177 10*3/uL (ref 140–400)
RBC: 4.3 10*6/uL (ref 3.80–5.10)
RDW: 13 % (ref 11.0–15.0)
Total Lymphocyte: 30.9 %
WBC mixed population: 350 cells/uL (ref 200–950)
WBC: 5.3 10*3/uL (ref 3.8–10.8)

## 2018-07-17 LAB — COMPLETE METABOLIC PANEL WITH GFR
AG Ratio: 2.1 (calc) (ref 1.0–2.5)
ALT: 22 U/L (ref 6–29)
AST: 15 U/L (ref 10–35)
Albumin: 4.6 g/dL (ref 3.6–5.1)
Alkaline phosphatase (APISO): 58 U/L (ref 33–130)
BUN: 13 mg/dL (ref 7–25)
CALCIUM: 9.9 mg/dL (ref 8.6–10.4)
CO2: 30 mmol/L (ref 20–32)
Chloride: 104 mmol/L (ref 98–110)
Creat: 0.86 mg/dL (ref 0.50–1.05)
GFR, EST NON AFRICAN AMERICAN: 75 mL/min/{1.73_m2} (ref >=60)
GFR, Est African American: 87 mL/min/{1.73_m2} (ref >=60)
GLUCOSE: 173 mg/dL — AB (ref 65–99)
Globulin: 2.2 g/dL (calc) (ref 1.9–3.7)
Potassium: 4.3 mmol/L (ref 3.5–5.3)
Sodium: 139 mmol/L (ref 135–146)
Total Bilirubin: 0.9 mg/dL (ref 0.2–1.2)
Total Protein: 6.8 g/dL (ref 6.1–8.1)

## 2018-07-17 LAB — HEMOGLOBIN A1C
EAG (MMOL/L): 10.6 (calc)
Hgb A1c MFr Bld: 8.3 % of total Hgb — ABNORMAL HIGH (ref <5.7)
Mean Plasma Glucose: 192 (calc)

## 2018-07-17 LAB — VITAMIN D 25 HYDROXY (VIT D DEFICIENCY, FRACTURES): Vit D, 25-Hydroxy: 41 ng/mL (ref 30–100)

## 2018-07-17 LAB — LIPID PANEL
CHOL/HDL RATIO: 2.3 (calc) (ref <5.0)
Cholesterol: 115 mg/dL (ref <200)
HDL: 50 mg/dL — AB (ref >50)
LDL CHOLESTEROL (CALC): 48 mg/dL
NON-HDL CHOLESTEROL (CALC): 65 mg/dL (ref <130)
TRIGLYCERIDES: 90 mg/dL (ref <150)

## 2018-07-17 LAB — VITAMIN B12: Vitamin B-12: 309 pg/mL (ref 200–1100)

## 2018-07-17 LAB — T4, FREE: Free T4: 1 ng/dL (ref 0.8–1.8)

## 2018-07-17 LAB — TSH: TSH: 2.06 mIU/L (ref 0.40–4.50)

## 2018-07-19 ENCOUNTER — Ambulatory Visit (INDEPENDENT_AMBULATORY_CARE_PROVIDER_SITE_OTHER): Payer: Managed Care, Other (non HMO) | Admitting: Family Medicine

## 2018-07-19 ENCOUNTER — Encounter: Payer: Self-pay | Admitting: Family Medicine

## 2018-07-19 ENCOUNTER — Encounter: Payer: Managed Care, Other (non HMO) | Admitting: Family Medicine

## 2018-07-19 ENCOUNTER — Other Ambulatory Visit: Payer: Self-pay | Admitting: Family Medicine

## 2018-07-19 VITALS — BP 105/60 | HR 71 | Temp 98.7°F | Resp 16 | Ht 66.0 in | Wt 184.0 lb

## 2018-07-19 DIAGNOSIS — E785 Hyperlipidemia, unspecified: Secondary | ICD-10-CM

## 2018-07-19 DIAGNOSIS — I10 Essential (primary) hypertension: Secondary | ICD-10-CM | POA: Diagnosis not present

## 2018-07-19 DIAGNOSIS — Z23 Encounter for immunization: Secondary | ICD-10-CM

## 2018-07-19 DIAGNOSIS — Z0000 Encounter for general adult medical examination without abnormal findings: Secondary | ICD-10-CM | POA: Diagnosis not present

## 2018-07-19 DIAGNOSIS — E1169 Type 2 diabetes mellitus with other specified complication: Secondary | ICD-10-CM | POA: Diagnosis not present

## 2018-07-19 DIAGNOSIS — L84 Corns and callosities: Secondary | ICD-10-CM

## 2018-07-19 MED ORDER — ROSUVASTATIN CALCIUM 20 MG PO TABS: 20.0000 mg | ORAL_TABLET | Freq: Every day | ORAL | 3 refills | Status: DC

## 2018-07-19 MED ORDER — DULAGLUTIDE 1.5 MG/0.5ML ~~LOC~~ SOAJ: 1.5000 mg | SUBCUTANEOUS | 5 refills | Status: DC

## 2018-07-19 NOTE — Progress Notes (Signed)
Subjective:    Patient ID: Andrea Mcdonald, female    DOB: 02-20-1961, 57 y.o.   MRN: [id number]  Andrea Mcdonald is a 57 y.o. female presenting on 07/19/2018 for Annual Exam   HPI   Here for Annual Physical and Lab Review  CHRONIC DM, Type 2 Recent course with previously improved A1c down to 7 on GLP1 agent. Now recently has had elevated sugars, admits to being stressed with her daughter going through a divorce, and worse anxiety/worry. She has been stress eating. CBGs: fasting AM CBG Meds:MetforminXR500mg  BID, Trulicity 0.75mg  [place] weekly inj - Prior meds on Tresiba and Victoza Reports good compliancenow improved tolerating well, except occasional GI intolerance metformin Currently on ACEi Lifestyle: - Diet improved still, now inc portions w/ stress eating - Exercise (No regular exercise, walking at work) Admits still has some episodes of loose stool Denies hypoglycemia  HYPERLIPIDEMIA: - Reports no concerns. Last lipid panel mostly controlled - Currently taking Rosuvastatin 20mg , tolerating well without side effects or myalgias On ASA 81  CHRONIC HTN: Reports recent anxiety / stress Current Meds - Lisinopril 5mg  Reports good compliance, took meds today. Tolerating well, w/o complaints.  Vitamin D / Vitamin B12 Results are normal on recent labs.  Health Maintenance: Due for Flu Shot, will receive today    Depression screen [facility] 2/9 07/19/2018 04/20/2018 03/18/2018  Decreased Interest 0 0 0  Down, Depressed, Hopeless 0 0 0  PHQ - 2 Score 0 0 0  Altered sleeping - 0 -  Tired, decreased energy - 3 -  Change in appetite - 0 -  Feeling bad or failure about yourself  - 0 -  Trouble concentrating - 1 -  Moving slowly or fidgety/restless - 0 -  Suicidal thoughts - 0 -  PHQ-9 Score - 4 -  Difficult doing work/chores - - -    Past Medical History:  Diagnosis Date  . High cholesterol   . Plantar fasciitis, bilateral 05/25/2013   Past Surgical History:    Procedure Laterality Date  . AUGMENTATION MAMMAPLASTY    . ESOPHAGOGASTRIC FUNDOPLICATION  7829   at [facility], for GERD  . KIDNEY STONE SURGERY    . LITHOTRIPSY    . NECK SURGERY     x2  . PARTIAL HYSTERECTOMY    . PLACEMENT OF BREAST IMPLANTS     Social History   Socioeconomic History  . Marital status: Married    Spouse name: Not on file  . Number of children: Not on file  . Years of education: Not on file  . Highest education level: Not on file  Occupational History  . Not on file  Social Needs  . Financial resource strain: Not on file  . Food insecurity:    Worry: Not on file    Inability: Not on file  . Transportation needs:    Medical: Not on file    Non-medical: Not on file  Tobacco Use  . Smoking status: Never Smoker  . Smokeless tobacco: Never Used  Substance and Sexual Activity  . Alcohol use: Yes    Comment: drink occ  . Drug use: No  . Sexual activity: Yes  Lifestyle  . Physical activity:    Days per week: Not on file    Minutes per session: Not on file  . Stress: Not on file  Relationships  . Social connections:    Talks on phone: Not on file    Gets together: Not on file    Attends  religious service: Not on file    Active member of club or organization: Not on file    Attends meetings of clubs or organizations: Not on file    Relationship status: Not on file  . Intimate partner violence:    Fear of current or ex partner: Not on file    Emotionally abused: Not on file    Physically abused: Not on file    Forced sexual activity: Not on file  Other Topics Concern  . Not on file  Social History Narrative  . Not on file   Family History  Problem Relation Age of Onset  . Heart disease Mother   . Depression Mother   . Anxiety disorder Mother   . Breast cancer Neg Hx    Current Outpatient Medications on File Prior to Visit  Medication Sig  . aspirin 81 MG chewable tablet Chew 81 mg by mouth at bedtime.  . fluticasone (FLONASE) 50 MCG/ACT nasal  spray Place 2 sprays into both nostrils daily.  Marland Kitchen lisinopril (PRINIVIL,ZESTRIL) 5 MG tablet Take 1 tablet (5 mg total) by mouth daily.  Marland Kitchen loratadine (CLARITIN) 10 MG tablet Take 1 tablet (10 mg total) by mouth daily.  . metFORMIN (GLUCOPHAGE-XR) 500 MG 24 hr tablet Take 1 tablet (500 mg total) by mouth 2 (two) times daily with a meal.  . ondansetron (ZOFRAN) 4 MG tablet Take 1-2 tablets (4-8 mg total) by mouth every 8 (eight) hours as needed for nausea or vomiting.  Marland Kitchen PAZEO 0.7 % SOLN   . rOPINIRole (REQUIP) 0.25 MG tablet Take 1-4 tablets (0.25-1 mg total) by mouth at bedtime. 1-3 hours before bed  . sertraline (ZOLOFT) 100 MG tablet Take 1.5 tablets (150 mg total) by mouth at bedtime.   No current facility-administered medications on file prior to visit.     Review of Systems  Constitutional: Negative for activity change, appetite change, chills, diaphoresis, fatigue and fever.  HENT: Negative for congestion and hearing loss.   Eyes: Negative for visual disturbance.  Respiratory: Negative for apnea, cough, choking, chest tightness, shortness of breath and wheezing.   Cardiovascular: Negative for chest pain, palpitations and leg swelling.  Gastrointestinal: Negative for abdominal pain, anal bleeding, blood in stool, constipation, diarrhea, nausea and vomiting.  Endocrine: Negative for cold intolerance.  Genitourinary: Negative for difficulty urinating, dysuria, frequency and hematuria.  Musculoskeletal: Negative for arthralgias, back pain and neck pain.  Skin: Negative for rash.  Allergic/Immunologic: Negative for environmental allergies.  Neurological: Negative for dizziness, weakness, light-headedness, numbness and headaches.  Hematological: Negative for adenopathy.  Psychiatric/Behavioral: Negative for behavioral problems, dysphoric mood and sleep disturbance. The patient is not nervous/anxious.    Per HPI unless specifically indicated above     Objective:    BP 105/60   Pulse  71   Temp 98.7 F (37.1 C) (Other (Comment))   Resp 16   Ht 5\' 6"  (1.676 m)   Wt 184 lb (83.5 kg)   BMI 29.70 kg/m   Wt Readings from Last 3 Encounters:  07/19/18 184 lb (83.5 kg)  04/20/18 180 lb (81.6 kg)  03/29/18 179 lb (81.2 kg)    Physical Exam  Constitutional: She is oriented to person, place, and time. She appears well-developed and well-nourished. No distress.  Well-appearing, comfortable, cooperative  HENT:  Head: Normocephalic and atraumatic.  Mouth/Throat: Oropharynx is clear and moist.  Frontal / maxillary sinuses non-tender. Nares patent without purulence or edema. Bilateral TMs clear without erythema, effusion or bulging. Oropharynx clear without  erythema, exudates, edema or asymmetry.  Eyes: Pupils are equal, round, and reactive to light. Conjunctivae and EOM are normal. Right eye exhibits no discharge. Left eye exhibits no discharge.  Neck: Normal range of motion. Neck supple. No thyromegaly present.  Cardiovascular: Normal rate, regular rhythm, normal heart sounds and intact distal pulses.  No murmur heard. Pulmonary/Chest: Effort normal and breath sounds normal. No respiratory distress. She has no wheezes. She has no rales.  Abdominal: Soft. Bowel sounds are normal. She exhibits no distension and no mass. There is no tenderness.  Musculoskeletal: Normal range of motion. She exhibits no edema or tenderness.  Upper / Lower Extremities: - Normal muscle tone, strength bilateral upper extremities 5/5, lower extremities 5/5  Lymphadenopathy:    She has no cervical adenopathy.  Neurological: She is alert and oriented to person, place, and time.  Distal sensation intact to light touch all extremities  Skin: Skin is warm and dry. No rash noted. She is not diaphoretic. No erythema.  Psychiatric: She has a normal mood and affect. Her behavior is normal.  Well groomed, good eye contact, normal speech and thoughts  Nursing note and vitals reviewed.    Diabetic Foot Exam -  Simple   Simple Foot Form Diabetic Foot exam was performed with the following findings:  Yes 07/19/2018  4:23 PM  Visual Inspection See comments:  Yes Sensation Testing Intact to touch and monofilament testing bilaterally:  Yes Pulse Check Posterior Tibialis and Dorsalis pulse intact bilaterally:  Yes Comments Bilateral medial aspect great toe early mild callus formation. No ulceration or other deformity.     Results for orders placed or performed in visit on 07/16/18  T4, free  Result Value Ref Range   Free T4 1.0 0.8 - 1.8 ng/dL  TSH  Result Value Ref Range   TSH 2.06 0.40 - 4.50 mIU/L  Vitamin B12  Result Value Ref Range   Vitamin B-12 309 200 - 1,100 pg/mL  VITAMIN D 25 Hydroxy (Vit-D Deficiency, Fractures)  Result Value Ref Range   Vit D, 25-Hydroxy 41 30 - 100 ng/mL  Lipid panel  Result Value Ref Range   Cholesterol 115 <200 mg/dL   HDL 50 (L) >50 mg/dL   Triglycerides 90 <150 mg/dL   LDL Cholesterol (Calc) 48 mg/dL (calc)   Total CHOL/HDL Ratio 2.3 <5.0 (calc)   Non-HDL Cholesterol (Calc) 65 <130 mg/dL (calc)  COMPLETE METABOLIC PANEL WITH GFR  Result Value Ref Range   Glucose, Bld 173 (H) 65 - 99 mg/dL   BUN 13 7 - 25 mg/dL   Creat 0.86 0.50 - 1.05 mg/dL   GFR, Est Non African American 75 > OR = 60 mL/min/1.72m2   GFR, Est African American 87 > OR = 60 mL/min/1.81m2   BUN/Creatinine Ratio NOT APPLICABLE 6 - 22 (calc)   Sodium 139 135 - 146 mmol/L   Potassium 4.3 3.5 - 5.3 mmol/L   Chloride 104 98 - 110 mmol/L   CO2 30 20 - 32 mmol/L   Calcium 9.9 8.6 - 10.4 mg/dL   Total Protein 6.8 6.1 - 8.1 g/dL   Albumin 4.6 3.6 - 5.1 g/dL   Globulin 2.2 1.9 - 3.7 g/dL (calc)   AG Ratio 2.1 1.0 - 2.5 (calc)   Total Bilirubin 0.9 0.2 - 1.2 mg/dL   Alkaline phosphatase (APISO) 58 33 - 130 U/L   AST 15 10 - 35 U/L   ALT 22 6 - 29 U/L  CBC with Differential/Platelet  Result Value Ref  Range   WBC 5.3 3.8 - 10.8 Thousand/uL   RBC 4.30 3.80 - 5.10 Million/uL   Hemoglobin  12.8 11.7 - 15.5 g/dL   HCT 37.5 35.0 - 45.0 %   MCV 87.2 80.0 - 100.0 fL   MCH 29.8 27.0 - 33.0 pg   MCHC 34.1 32.0 - 36.0 g/dL   RDW 13.0 11.0 - 15.0 %   Platelets 177 140 - 400 Thousand/uL   MPV 11.0 7.5 - 12.5 fL   Neutro Abs 3,201 1,500 - 7,800 cells/uL   Lymphs Abs 1,638 850 - 3,900 cells/uL   WBC mixed population 350 200 - 950 cells/uL   Eosinophils Absolute 80 15 - 500 cells/uL   Basophils Absolute 32 0 - 200 cells/uL   Neutrophils Relative % 60.4 %   Total Lymphocyte 30.9 %   Monocytes Relative 6.6 %   Eosinophils Relative 1.5 %   Basophils Relative 0.6 %  Hemoglobin A1c  Result Value Ref Range   Hgb A1c MFr Bld 8.3 (H) <5.7 % of total Hgb   Mean Plasma Glucose 192 (calc)   eAG (mmol/L) 10.6 (calc)      Assessment & Plan:   Problem List Items Addressed This Visit    Essential hypertension    Well-controlled HTN, low normal - Home BP readings none  No known complications  Plan:  1. Continue current BP regimen - Lisinopril 5mg  daily 2. Encourage improved lifestyle - low sodium diet, regular exercise 3. Continue monitor BP outside office, bring readings to next visit, if persistently >140/90 or new symptoms notify office sooner 4. Follow-up 3 months      Relevant Medications   rosuvastatin (CRESTOR) 20 MG tablet   Hyperlipidemia associated with type 2 diabetes mellitus ([place])    Controlled cholesterol on statin  Last lipid panel 06/2018  Plan: 1. Continue current meds - Rosuvastatin 20mg  daily 2. Continue ASA 81mg  for primary ASCVD risk reduction 3. Encourage improved lifestyle - low carb/cholesterol, reduce portion size, continue improving regular exercise      Relevant Medications   Dulaglutide (TRULICITY) 1.5 WU/[id number] SOPN   rosuvastatin (CRESTOR) 20 MG tablet   Type 2 diabetes mellitus with other specified complication (HCC)    Significantly elevated A1c from 7 up to 8.3, with hyperglycemia Likely secondary to stress/anxiety - stress eating inc  portions No hypoglycemia Complicated by hyperglycemia, hyperlipidemia (controlled) Failed Victoza, Lisabeth Pick  Additionally reviewed given GI symptoms persistent, and prior poor DM control, may be at risk for possible DM gastroparesis - will consider in future if unremarkable testing and symptoms  Plan:  1. INCREASE Trulicity from 9.14 weekly up to 1.5 weekly, given sample x2 pen today and sent new rx - CONTINUE reduced metformin XR 500mg  BID - Encourage improved lifestyle - emphasized importance low carb, low sugar diet, reduce portion size, regular exercise - Follow-up 3 months a1c      Relevant Medications   Dulaglutide (TRULICITY) 1.5 NW/2.9FA SOPN   rosuvastatin (CRESTOR) 20 MG tablet   Other Relevant Orders   Ambulatory referral to Podiatry    Other Visit Diagnoses    Annual physical exam    -  Primary  Updated Health Maintenance information Reviewed recent lab results with patient Encouraged improvement to lifestyle with diet and exercise    Needs flu shot       Relevant Orders   Flu Vaccine QUAD 36+ mos IM (Completed)   Callus of foot     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 Dulaglutide (TRULICITY) 1.5 CV/0.1TH SOPN    Sig: Inject 1.5 mg into the skin once a week.    Dispense:  4 pen    Refill:  5    Dose increased from 0.75 up to 1.5  . rosuvastatin (CRESTOR) 20 MG tablet    Sig: Take 1 tablet (20 mg total) by mouth at bedtime.    Dispense:  90 tablet    Refill:  3    Please consider 90 day supplies to promote better adherence  . DISCONTD: Dulaglutide (TRULICITY) 1.5 YH/8.8IL SOPN    Sig: Inject 1.5 mg into the skin once a week.    Dispense:  2 pen    Refill:  0   Orders Placed This Encounter  Procedures  . Flu Vaccine QUAD 36+ mos IM  . Ambulatory referral to Podiatry    Referral Priority:   Routine    Referral Type:    Consultation    Referral Reason:   Specialty Services Required    Referred to Provider:   Cedric Fishman    Requested Specialty:   Podiatry    Number of Visits Requested:   1   Follow up plan: Return in about 3 months (around 10/18/2018) for DM2 A1c.  Nobie Putnam, [facility] 07/20/2018, 1:17 AM

## 2018-07-19 NOTE — Patient Instructions (Addendum)
Thank you for coming to the office today.  Increased Trulicity up to 1.5, from 0.75  New rx sent. You have 2 pen samples today.  Flu shot today.  Continue same dose Metformin, one pill twice a day.  Referral to Dr Milinda Pointer [facility] - stay tuned  [place] Address: [address] [place],  [address] Hours: Open 8AM-5PM Phone: [phone]  Please schedule a Follow-up Appointment to: Return in about 3 months (around 10/18/2018) for DM2 A1c.  If you have any other questions or concerns, please feel free to call the office or send a message through [place]. You may also schedule an earlier appointment if necessary.  Additionally, you may be receiving a survey about your experience at our office within a few days to 1 week by e-mail or mail. We value your feedback.  Nobie Putnam, DO [place]

## 2018-07-20 MED ORDER — DULAGLUTIDE 1.5 MG/0.5ML ~~LOC~~ SOAJ: 1.5000 mg | SUBCUTANEOUS | 0 refills | Status: DC

## 2018-07-20 NOTE — Assessment & Plan Note (Signed)
Controlled cholesterol on statin  Last lipid panel 06/2018  Plan: 1. Continue current meds - Rosuvastatin 20mg  daily 2. Continue ASA 81mg  for primary ASCVD risk reduction 3. Encourage improved lifestyle - low carb/cholesterol, reduce portion size, continue improving regular exercise

## 2018-07-20 NOTE — Assessment & Plan Note (Signed)
Well-controlled HTN, low normal - Home BP readings none  No known complications  Plan:  1. Continue current BP regimen - Lisinopril 5mg  daily 2. Encourage improved lifestyle - low sodium diet, regular exercise 3. Continue monitor BP outside office, bring readings to next visit, if persistently >140/90 or new symptoms notify office sooner 4. Follow-up 3 months

## 2018-07-20 NOTE — Assessment & Plan Note (Signed)
Significantly elevated A1c from 7 up to 8.3, with hyperglycemia Likely secondary to stress/anxiety - stress eating inc portions No hypoglycemia Complicated by hyperglycemia, hyperlipidemia (controlled) Failed Victoza, Lisabeth Pick  Additionally reviewed given GI symptoms persistent, and prior poor DM control, may be at risk for possible DM gastroparesis - will consider in future if unremarkable testing and symptoms  Plan:  1. INCREASE Trulicity from 4.99 weekly up to 1.5 weekly, given sample x2 pen today and sent new rx - CONTINUE reduced metformin XR 500mg  BID - Encourage improved lifestyle - emphasized importance low carb, low sugar diet, reduce portion size, regular exercise - Follow-up 3 months a1c

## 2018-10-18 ENCOUNTER — Ambulatory Visit: Payer: Managed Care, Other (non HMO) | Admitting: Family Medicine

## 2018-10-21 ENCOUNTER — Other Ambulatory Visit: Payer: Self-pay | Admitting: Family Medicine

## 2018-10-21 DIAGNOSIS — E1165 Type 2 diabetes mellitus with hyperglycemia: Principal | ICD-10-CM

## 2018-10-21 DIAGNOSIS — IMO0001 Reserved for inherently not codable concepts without codable children: Secondary | ICD-10-CM

## 2018-10-21 DIAGNOSIS — I10 Essential (primary) hypertension: Secondary | ICD-10-CM

## 2018-10-22 ENCOUNTER — Ambulatory Visit (INDEPENDENT_AMBULATORY_CARE_PROVIDER_SITE_OTHER): Payer: Managed Care, Other (non HMO) | Admitting: Family Medicine

## 2018-10-22 ENCOUNTER — Encounter: Payer: Self-pay | Admitting: Family Medicine

## 2018-10-22 VITALS — BP 111/67 | HR 76 | Temp 98.4°F | Resp 16 | Ht 66.0 in | Wt 184.0 lb

## 2018-10-22 DIAGNOSIS — R519 Headache, unspecified: Secondary | ICD-10-CM

## 2018-10-22 DIAGNOSIS — R51 Headache: Secondary | ICD-10-CM

## 2018-10-22 DIAGNOSIS — E1169 Type 2 diabetes mellitus with other specified complication: Secondary | ICD-10-CM | POA: Diagnosis not present

## 2018-10-22 DIAGNOSIS — I10 Essential (primary) hypertension: Secondary | ICD-10-CM

## 2018-10-22 MED ORDER — LISINOPRIL 5 MG PO TABS: 5.0000 mg | ORAL_TABLET | Freq: Every day | ORAL | 1 refills | Status: DC

## 2018-10-22 NOTE — Patient Instructions (Addendum)
Thank you for coming to the office today.  For Diabetes - Continue Trulicity 1.5mg  weekly  Refilled Lisinopril 5mg  daily for blood pressure, sent 90 pills  Also - I recommend that you use a Tylenol or Excedrin Migraine additionally as needed for bad days with headaches, most likely tension or stress related.  Recommend to start taking Tylenol Extra Strength 500mg  tabs - take 1 to 2 tabs per dose (max 1000mg ) every 6-8 hours for headache as needed  Can alternate Excedrin Migraine as needed - try to avoid rebound headache if take too much Tylenol, ibuprofen, or Excedrin  Please schedule a Follow-up Appointment to: Return in about 3 months (around 01/21/2019) for Follow-up 3 months DM A1c, headaches.  If you have any other questions or concerns, please feel free to call the office or send a message through [place]. You may also schedule an earlier appointment if necessary.  Additionally, you may be receiving a survey about your experience at our office within a few days to 1 week by e-mail or mail. We value your feedback.  Nobie Putnam, DO [place]

## 2018-10-22 NOTE — Progress Notes (Signed)
Subjective:    Patient ID: Andrea Mcdonald, female    DOB: [birth year], 57 y.o.   MRN: [id number]  Andrea Mcdonald is a 57 y.o. female presenting on 10/22/2018 for Diabetes and Headache (onset month as per patient may be stress related)   HPI   CHRONIC DM, Type 2 - Last visit with me 07/19/18, for diabetes, treated with increased Trulicity from 0.86 up to 1.5, see prior notes for background information. - Interval update with poor diet and increased stress admits increased sugars, w/ stress eating CBGs:elevated readings Meds:MetforminXR500mg  BID, Trulicity 1.5mg  [place] weekly inj - Prior meds on Tresiba and Victoza Reports good compliancenow improved tolerating well, except occasional GI intolerance metformin Currently on ACEi Lifestyle: - Diet improved still, now inc portions w/ stress eating - Exercise (No regular exercise, walking at work) [place] has some episodes of loose stool, occasional blurry vision Denies hypoglycemia  Headaches Reports daily headaches onset about 1 month ago, some days are worse than others, usually lasting for several hours, improve with Meloxicam.  - Worse with stress with mother diagnosed w/ cancer, and recent stress at work. - Admits history of migraine in past, when switched from wellbutrin to zoloft Denies any vision loss, numbness, weakness or tingling    Depression screen [facility] 2/9 10/22/2018 07/19/2018 04/20/2018  Decreased Interest 1 0 0  Down, Depressed, Hopeless 1 0 0  PHQ - 2 Score 2 0 0  Altered sleeping 1 - 0  Tired, decreased energy 1 - 3  Change in appetite 2 - 0  Feeling bad or failure about yourself  0 - 0  Trouble concentrating 1 - 1  Moving slowly or fidgety/restless 0 - 0  Suicidal thoughts 0 - 0  PHQ-9 Score 7 - 4  Difficult doing work/chores Not difficult at all - -    Social History   Tobacco Use  . Smoking status: Never Smoker  . Smokeless tobacco: Never Used  Substance Use Topics  . Alcohol use: Yes   Comment: drink occ  . Drug use: No    Review of Systems Per HPI unless specifically indicated above     Objective:    BP 111/67   Pulse 76   Temp 98.4 F (36.9 C) (Oral)   Resp 16   Ht 5\' 6"  (1.676 m)   Wt 184 lb (83.5 kg)   BMI 29.70 kg/m   Wt Readings from Last 3 Encounters:  10/22/18 184 lb (83.5 kg)  07/19/18 184 lb (83.5 kg)  04/20/18 180 lb (81.6 kg)    Physical Exam Vitals signs and nursing note reviewed.  Constitutional:      General: She is not in acute distress.    Appearance: She is well-developed. She is not diaphoretic.     Comments: Well-appearing, comfortable, cooperative  HENT:     Head: Normocephalic and atraumatic.  Eyes:     General:        Right eye: No discharge.        Left eye: No discharge.     Conjunctiva/sclera: Conjunctivae normal.  Cardiovascular:     Rate and Rhythm: Normal rate.  Pulmonary:     Effort: Pulmonary effort is normal.  Skin:    General: Skin is warm and dry.     Findings: No erythema or rash.  Neurological:     Mental Status: She is alert and oriented to person, place, and time.     Sensory: No sensory deficit.     Motor: No  weakness.  Psychiatric:        Behavior: Behavior normal.     Comments: Well groomed, good eye contact, normal speech and thoughts    Results for orders placed or performed in visit on 10/22/18  POCT HgB A1C  Result Value Ref Range   Hemoglobin A1C 7.4 (A) 4.0 - 5.6 %      Assessment & Plan:   Problem List Items Addressed This Visit    Essential hypertension  Controlled BP Refill Lisinopril 5mg     Relevant Medications   lisinopril (PRINIVIL,ZESTRIL) 5 MG tablet   Type 2 diabetes mellitus with other specified complication (HCC) - Primary  Improved A1c to 7.4 from 8.3 on higher dose GLP1 Trulicity Likely secondary to stress/anxiety - stress eating inc portions No hypoglycemia Complicated by hyperglycemia, hyperlipidemia (controlled) Failed Victoza, Lisabeth Pick   Plan:  1.  Continue Trulicity 1.5 weekly - CONTINUE metformin XR 500mg  BID - future if improve A1c can reduce this to avoid GI intolerance - Encourage improved lifestyle - emphasized importance low carb, low sugar diet, reduce portion size, regular exercise - Follow-up 3 months a1c    Relevant Medications   lisinopril (PRINIVIL,ZESTRIL) 5 MG tablet   Other Relevant Orders   POCT HgB A1C    Other Visit Diagnoses    Frequent headaches     Suspected stress/tension headaches History of migraines, but clinically different Seems uncomplicated On NSAID already, advised add Tylenol PRN or consider Excedrin  Follow-up if not improving      Meds ordered this encounter  Medications  . lisinopril (PRINIVIL,ZESTRIL) 5 MG tablet    Sig: Take 1 tablet (5 mg total) by mouth daily.    Dispense:  90 tablet    Refill:  1      Follow up plan: Return in about 3 months (around 01/21/2019) for Follow-up 3 months DM A1c, headaches.   Nobie Putnam, DO [facility] 10/22/2018, 4:05 PM

## 2018-10-23 ENCOUNTER — Encounter: Payer: Self-pay | Admitting: Family Medicine

## 2018-10-23 LAB — POCT GLYCOSYLATED HEMOGLOBIN (HGB A1C): Hemoglobin A1C: 7.4 % — AB (ref 4.0–5.6)

## 2018-11-26 ENCOUNTER — Telehealth: Payer: Self-pay | Admitting: Family Medicine

## 2018-11-26 NOTE — Telephone Encounter (Signed)
Patient will be here Monday to pick up Rx.

## 2018-11-26 NOTE — Telephone Encounter (Signed)
Please notify patient that we can give her one box Trulicity 1.5 sample - it should have TWO pens in it for 2 weeks of medicine.  She may come by to pick this up at any time.  This should be enough until her rx gets approved. We potentially can do one more if need - but we will need to sort out the rx coverage.  Nobie Putnam, [facility] 11/26/2018, 3:26 PM

## 2018-11-26 NOTE — Telephone Encounter (Signed)
Pt said pharmacy is still waiting on authorization for trulicity and asked if office had samples [phone]

## 2018-12-02 NOTE — Telephone Encounter (Signed)
PA has approved for Trulicity 1.0/2.5 through 12/01/19.

## 2019-01-11 ENCOUNTER — Other Ambulatory Visit: Payer: Self-pay | Admitting: Family Medicine

## 2019-01-11 DIAGNOSIS — J301 Allergic rhinitis due to pollen: Secondary | ICD-10-CM

## 2019-01-21 ENCOUNTER — Encounter: Payer: Self-pay | Admitting: Family Medicine

## 2019-01-21 ENCOUNTER — Other Ambulatory Visit: Payer: Self-pay

## 2019-01-21 ENCOUNTER — Ambulatory Visit (INDEPENDENT_AMBULATORY_CARE_PROVIDER_SITE_OTHER): Payer: No Typology Code available for payment source | Admitting: Family Medicine

## 2019-01-21 DIAGNOSIS — E1169 Type 2 diabetes mellitus with other specified complication: Secondary | ICD-10-CM | POA: Diagnosis not present

## 2019-01-21 DIAGNOSIS — R51 Headache: Secondary | ICD-10-CM

## 2019-01-21 DIAGNOSIS — J301 Allergic rhinitis due to pollen: Secondary | ICD-10-CM

## 2019-01-21 DIAGNOSIS — R519 Headache, unspecified: Secondary | ICD-10-CM

## 2019-01-21 NOTE — Progress Notes (Signed)
Virtual Visit via Telephone The purpose of this virtual visit is to provide medical care while limiting exposure to the novel coronavirus (COVID19) for both patient and office staff.  Consent was obtained for phone visit:  Yes.   Answered questions that patient had about telehealth interaction:  Yes.   I discussed the limitations, risks, security and privacy concerns of performing an evaluation and management service by telephone. I also discussed with the patient that there may be a patient responsible charge related to this service. The patient expressed understanding and agreed to proceed.  Patient Location: Home Provider Location: [facility] (Office)  ---------------------------------------------------------------------- CC: Diabetes, Headaches  S: Reviewed CMA telephone note below. I have called patient and gathered additional HPI as follows:  CHRONIC DM, Type 2 - Last visit with me 09/2018, for diabetes, continued on Trulicity 1.5, see prior notes for background information. - Interval update with continued improvement, based on her lifestyle, reduced appetite CBGs: not checking currently awaiting updated glucometer Meds:MetforminXR500mg BID, Trulicity 1.5mg  [place] weekly inj - Prior meds on Tresiba and Victoza Reports good compliancenow improved tolerating well, except occasional GI intolerance metformin Currently on ACEi Lifestyle: - Diet improved, low carb options - Exercise (No regular exercise, walking at work) [place] has some episodes of loose stool Denies hypoglycemia, numbness tingling, polyuria  Headaches, Tension - Last visit with me 09/2018, for same problem daily headaches, treated with conservative, see prior notes for background information. - Interval update with some improvement, seems to have allergy related vs tension headaches by her report now, taking allergy pill cetirizine and flonase which help, she has not taken Tylenol due to  reports of should avoid ibuprofen and tylenol with coronavirus - Headaches less often now, not daily Denies any vision loss, numbness, weakness or tingling  Patient is currently on self quarantine Denies any high risk travel to areas of current concern for COVID19. Denies any known or suspected exposure to person with or possibly with COVID19.  Denies any fevers, chills, sweats, body ache, cough, shortness of breath, sinus pain or pressure, abdominal pain, diarrhea  Past Medical History:  Diagnosis Date  . High cholesterol   . Plantar fasciitis, bilateral 05/25/2013   Social History   Tobacco Use  . Smoking status: Never Smoker  . Smokeless tobacco: Never Used  Substance Use Topics  . Alcohol use: Yes    Comment: drink occ  . Drug use: No    Current Outpatient Medications:  .  aspirin 81 MG chewable tablet, Chew 81 mg by mouth at bedtime., Disp: , Rfl:  .  Dulaglutide (TRULICITY) 1.5 ID/7.8EU SOPN, Inject 1.5 mg into the skin once a week., Disp: 4 pen, Rfl: 5 .  fluticasone (FLONASE) 50 MCG/ACT nasal spray, Place 2 sprays into both nostrils daily., Disp: 16 g, Rfl: 11 .  lisinopril (PRINIVIL,ZESTRIL) 5 MG tablet, Take 1 tablet (5 mg total) by mouth daily., Disp: 90 tablet, Rfl: 1 .  loratadine (CLARITIN) 10 MG tablet, Take 1 tablet by mouth once daily, Disp: 90 tablet, Rfl: 3 .  metFORMIN (GLUCOPHAGE-XR) 500 MG 24 hr tablet, Take 1 tablet (500 mg total) by mouth 2 (two) times daily with a meal., Disp: 180 tablet, Rfl: 3 .  ondansetron (ZOFRAN) 4 MG tablet, Take 1-2 tablets (4-8 mg total) by mouth every 8 (eight) hours as needed for nausea or vomiting., Disp: 30 tablet, Rfl: 0 .  PAZEO 0.7 % SOLN, , Disp: , Rfl:  .  rosuvastatin (CRESTOR) 20 MG tablet, Take 1  tablet (20 mg total) by mouth at bedtime., Disp: 90 tablet, Rfl: 3 .  sertraline (ZOLOFT) 100 MG tablet, Take 1.5 tablets (150 mg total) by mouth at bedtime., Disp: 45 tablet, Rfl: 11 .  rOPINIRole (REQUIP) 0.25 MG tablet,  Take 1-4 tablets (0.25-1 mg total) by mouth at bedtime. 1-3 hours before bed (Patient not taking: Reported on 01/21/2019), Disp: 60 tablet, Rfl: 1  Depression screen [facility] 2/9 01/21/2019 10/22/2018 07/19/2018  Decreased Interest 0 1 0  Down, Depressed, Hopeless 1 1 0  PHQ - 2 Score 1 2 0  Altered sleeping 1 1 -  Tired, decreased energy 0 1 -  Change in appetite 1 2 -  Feeling bad or failure about yourself  0 0 -  Trouble concentrating 2 1 -  Moving slowly or fidgety/restless 0 0 -  Suicidal thoughts 0 0 -  PHQ-9 Score 5 7 -  Difficult doing work/chores Not difficult at all Not difficult at all -    GAD 7 : Generalized Anxiety Score 01/21/2019 10/22/2018 10/14/2017 04/22/2017  Nervous, Anxious, on Edge 2 1 1 2   Control/stop worrying 2 2 1 3   Worry too much - different things 2 2 1 3   Trouble relaxing 3 1 1 2   Restless 2 1 1 1   Easily annoyed or irritable 1 2 1 2   Afraid - awful might happen 2 2 2 3   Total GAD 7 Score 14 11 8 16   Anxiety Difficulty Not difficult at all Not difficult at all Somewhat difficult Somewhat difficult    -------------------------------------------------------------------------- O: No physical exam performed due to remote telephone encounter.  Recent Labs    04/14/18 0000 07/16/18 0806 10/23/18 0035  HGBA1C 7.0* 8.3* 7.4*    No results found for this or any previous visit (from the past 2160 hour(s)).  -------------------------------------------------------------------------- A&P:  Problem List Items Addressed This Visit    Allergic rhinitis   Type 2 diabetes mellitus with other specified complication ([place]) - Primary    Previous A1c improved to 7.4, last was >8 - she is due for A1c, will defer for now Some stress eating still. No CBG readings No hypoglycemia by symptoms Complicated by hyperglycemia, hyperlipidemia (controlled) Failed Victoza, Lisabeth Pick Considering possible gastroparesis, not clinically consistent at this time  Plan:  1.  CONTINUE high dose Trulicity 1.5mg  weekly - CONTINUE reduced metformin XR 500mg  BID - Encourage improved lifestyle - emphasized importance low carb, low sugar diet, reduce portion size, regular exercise - Follow-up 3 months a1c       Other Visit Diagnoses    Frequent headaches         Headaches/Allergies Seem secondary to chronic tension vs stress headaches, also now with some mild allergic symptoms as well, improved on anti histamine and flonase - Advised her to restart Tylenol PRN and can take Excedrin Migraine if need - She was avoiding due to reports that cannot take ibuprofen w/ coronavirus - She is LOW RISK at this time for coronavirus without symptoms or known exposure  No orders of the defined types were placed in this encounter.   Follow-up: - Return in 3 months for DM A1c  Patient verbalizes understanding with the above medical recommendations including the limitation of remote medical advice.  Specific follow-up and call-back criteria were given for patient to follow-up or seek medical care more urgently if needed.   - Time spent in direct consultation with patient on phone: 9 minutes  Nobie Putnam, [facility] 01/21/2019, 3:51 PM

## 2019-01-21 NOTE — Assessment & Plan Note (Signed)
Previous A1c improved to 7.4, last was >8 - she is due for A1c, will defer for now Some stress eating still. No CBG readings No hypoglycemia by symptoms Complicated by hyperglycemia, hyperlipidemia (controlled) Failed Victoza, Lisabeth Pick Considering possible gastroparesis, not clinically consistent at this time  Plan:  1. CONTINUE high dose Trulicity 1.5mg  weekly - CONTINUE reduced metformin XR 500mg  BID - Encourage improved lifestyle - emphasized importance low carb, low sugar diet, reduce portion size, regular exercise - Follow-up 3 months a1c

## 2019-01-21 NOTE — Patient Instructions (Addendum)
Follow-up 3 months for diabetes and A1c check  Continue medicines  Check sugar when able, notify us of your readings  Start on Tylenol as needed and or excedrin migraine for the headaches   If you have any other questions or concerns, please feel free to call the office or send a message through [place]. You may also schedule an earlier appointment if necessary.  Additionally, you may be receiving a survey about your experience at our office within a few days to 1 week by e-mail or mail. We value your feedback.  Nobie Putnam, DO [place]

## 2019-02-21 ENCOUNTER — Other Ambulatory Visit: Payer: Self-pay | Admitting: Family Medicine

## 2019-02-21 DIAGNOSIS — J301 Allergic rhinitis due to pollen: Secondary | ICD-10-CM

## 2019-02-21 DIAGNOSIS — E1169 Type 2 diabetes mellitus with other specified complication: Secondary | ICD-10-CM

## 2019-05-02 ENCOUNTER — Ambulatory Visit: Payer: No Typology Code available for payment source | Admitting: Family Medicine

## 2019-05-16 ENCOUNTER — Other Ambulatory Visit: Payer: Self-pay | Admitting: Family Medicine

## 2019-05-16 DIAGNOSIS — F32A Depression, unspecified: Secondary | ICD-10-CM

## 2019-05-16 DIAGNOSIS — F329 Major depressive disorder, single episode, unspecified: Secondary | ICD-10-CM

## 2019-06-06 LAB — HM DIABETES EYE EXAM

## 2019-06-16 ENCOUNTER — Other Ambulatory Visit: Payer: Self-pay | Admitting: Family Medicine

## 2019-06-16 DIAGNOSIS — E1169 Type 2 diabetes mellitus with other specified complication: Secondary | ICD-10-CM

## 2019-06-16 DIAGNOSIS — I10 Essential (primary) hypertension: Secondary | ICD-10-CM

## 2019-06-16 DIAGNOSIS — Z0000 Encounter for general adult medical examination without abnormal findings: Secondary | ICD-10-CM

## 2019-07-15 ENCOUNTER — Other Ambulatory Visit: Payer: No Typology Code available for payment source

## 2019-07-15 ENCOUNTER — Other Ambulatory Visit: Payer: Self-pay

## 2019-07-15 DIAGNOSIS — E1169 Type 2 diabetes mellitus with other specified complication: Secondary | ICD-10-CM

## 2019-07-15 DIAGNOSIS — Z0000 Encounter for general adult medical examination without abnormal findings: Secondary | ICD-10-CM

## 2019-07-15 DIAGNOSIS — E785 Hyperlipidemia, unspecified: Secondary | ICD-10-CM

## 2019-07-15 DIAGNOSIS — I10 Essential (primary) hypertension: Secondary | ICD-10-CM

## 2019-07-16 LAB — LIPID PANEL
Cholesterol: 115 mg/dL (ref <200)
HDL: 53 mg/dL (ref >=50)
LDL Cholesterol (Calc): 44 mg/dL (calc)
Non-HDL Cholesterol (Calc): 62 mg/dL (calc) (ref <130)
Total CHOL/HDL Ratio: 2.2 (calc) (ref <5.0)
Triglycerides: 98 mg/dL (ref <150)

## 2019-07-16 LAB — COMPREHENSIVE METABOLIC PANEL
AG Ratio: 2 (calc) (ref 1.0–2.5)
ALT: 16 U/L (ref 6–29)
AST: 13 U/L (ref 10–35)
Albumin: 4.3 g/dL (ref 3.6–5.1)
Alkaline phosphatase (APISO): 50 U/L (ref 37–153)
BUN: 12 mg/dL (ref 7–25)
CO2: 24 mmol/L (ref 20–32)
Calcium: 9.3 mg/dL (ref 8.6–10.4)
Chloride: 107 mmol/L (ref 98–110)
Creat: 0.84 mg/dL (ref 0.50–1.05)
Globulin: 2.1 g/dL (calc) (ref 1.9–3.7)
Glucose, Bld: 157 mg/dL — ABNORMAL HIGH (ref 65–99)
Potassium: 4.1 mmol/L (ref 3.5–5.3)
Sodium: 141 mmol/L (ref 135–146)
Total Bilirubin: 0.9 mg/dL (ref 0.2–1.2)
Total Protein: 6.4 g/dL (ref 6.1–8.1)

## 2019-07-16 LAB — CBC WITH DIFFERENTIAL/PLATELET
Absolute Monocytes: 329 cells/uL (ref 200–950)
Basophils Absolute: 37 cells/uL (ref 0–200)
Basophils Relative: 0.7 %
Eosinophils Absolute: 80 cells/uL (ref 15–500)
Eosinophils Relative: 1.5 %
HCT: 37.5 % (ref 35.0–45.0)
Hemoglobin: 12.1 g/dL (ref 11.7–15.5)
Lymphs Abs: 1707 cells/uL (ref 850–3900)
MCH: 28.5 pg (ref 27.0–33.0)
MCHC: 32.3 g/dL (ref 32.0–36.0)
MCV: 88.2 fL (ref 80.0–100.0)
MPV: 10.7 fL (ref 7.5–12.5)
Monocytes Relative: 6.2 %
Neutro Abs: 3148 cells/uL (ref 1500–7800)
Neutrophils Relative %: 59.4 %
Platelets: 181 10*3/uL (ref 140–400)
RBC: 4.25 10*6/uL (ref 3.80–5.10)
RDW: 13.4 % (ref 11.0–15.0)
Total Lymphocyte: 32.2 %
WBC: 5.3 10*3/uL (ref 3.8–10.8)

## 2019-07-16 LAB — TSH: TSH: 2.28 mIU/L (ref 0.40–4.50)

## 2019-07-16 LAB — HEMOGLOBIN A1C
Hgb A1c MFr Bld: 7.6 % of total Hgb — ABNORMAL HIGH (ref <5.7)
Mean Plasma Glucose: 171 (calc)
eAG (mmol/L): 9.5 (calc)

## 2019-07-21 ENCOUNTER — Encounter: Payer: Self-pay | Admitting: Family Medicine

## 2019-07-21 ENCOUNTER — Ambulatory Visit (INDEPENDENT_AMBULATORY_CARE_PROVIDER_SITE_OTHER): Payer: No Typology Code available for payment source | Admitting: Family Medicine

## 2019-07-21 ENCOUNTER — Other Ambulatory Visit: Payer: Self-pay

## 2019-07-21 VITALS — BP 104/53 | HR 74 | Temp 98.7°F | Resp 16 | Ht 66.0 in | Wt 182.0 lb

## 2019-07-21 DIAGNOSIS — I10 Essential (primary) hypertension: Secondary | ICD-10-CM | POA: Diagnosis not present

## 2019-07-21 DIAGNOSIS — E1169 Type 2 diabetes mellitus with other specified complication: Secondary | ICD-10-CM | POA: Diagnosis not present

## 2019-07-21 DIAGNOSIS — Z0000 Encounter for general adult medical examination without abnormal findings: Secondary | ICD-10-CM | POA: Diagnosis not present

## 2019-07-21 DIAGNOSIS — F418 Other specified anxiety disorders: Secondary | ICD-10-CM

## 2019-07-21 DIAGNOSIS — E663 Overweight: Secondary | ICD-10-CM

## 2019-07-21 DIAGNOSIS — Z23 Encounter for immunization: Secondary | ICD-10-CM | POA: Diagnosis not present

## 2019-07-21 DIAGNOSIS — E785 Hyperlipidemia, unspecified: Secondary | ICD-10-CM

## 2019-07-21 MED ORDER — ROSUVASTATIN CALCIUM 20 MG PO TABS: 20.0000 mg | ORAL_TABLET | Freq: Every day | ORAL | 3 refills | Status: DC

## 2019-07-21 MED ORDER — LISINOPRIL 5 MG PO TABS: 5.0000 mg | ORAL_TABLET | Freq: Every day | ORAL | 3 refills | Status: DC

## 2019-07-21 MED ORDER — TRULICITY 1.5 MG/0.5ML ~~LOC~~ SOAJ: SUBCUTANEOUS | 11 refills | Status: DC

## 2019-07-21 MED ORDER — METFORMIN HCL ER 500 MG PO TB24: 500.0000 mg | ORAL_TABLET | Freq: Two times a day (BID) | ORAL | 3 refills | Status: DC

## 2019-07-21 NOTE — Assessment & Plan Note (Signed)
Stable to improved Controlled on SSRI and now reduced stress with new job  Plan Continue current treatment plan

## 2019-07-21 NOTE — Progress Notes (Signed)
Subjective:    Patient ID: Andrea Mcdonald, female    DOB: [birth year], 58 y.o.   MRN: [id number]  Andrea Mcdonald is a 58 y.o. female presenting on 07/21/2019 for Annual Exam   HPI   Here for Annual Physical and Lab Review.   CHRONIC DM, Type 2 / Overweight BMI >29 - Last visit with me 12/2018, fordiabetes,continued on Trulicity 1.5, see prior notes for background information. - Interval update withcontinued improvement, based on her lifestyle, reduced appetite CBGs: not checking currently awaiting updated glucometer Meds:MetforminXR500mg BID, Trulicity1.5mg  [place] weekly inj - Prior meds on Tresiba and Victoza Reports good compliancenow improved tolerating well, except occasional GI intolerance metformin Currently on ACEi Lifestyle: - Diet improved, low carb options - Exercise (No regular exercise, walking at work - goal to start more regular exercise walking) Admitsstill has some episodes of loose stool Denies hypoglycemia, numbness tingling, polyuria  HYPERLIPIDEMIA: - Reports no concerns. Last lipid panel mostly controlled - Currently taking Rosuvastatin 20mg , tolerating well without side effects or myalgias On ASA 81  Chronic Anxiety, mixed depression Now since last visit she has changed job, less stressful, doing better. Continues on Sertraline 150mg  daily, one and half of the 100mg  tab  CHRONIC HTN: Current Meds - Lisinopril 5mg  Reports good compliance, took meds today. Tolerating well, w/o complaints.   Health Maintenance: Due for Flu Shot, will receive today   Depression screen [facility] 2/9 07/21/2019 01/21/2019 10/22/2018  Decreased Interest 0 0 1  Down, Depressed, Hopeless 0 1 1  PHQ - 2 Score 0 1 2  Altered sleeping 0 1 1  Tired, decreased energy 1 0 1  Change in appetite 1 1 2   Feeling bad or failure about yourself  0 0 0  Trouble concentrating 1 2 1   Moving slowly or fidgety/restless 0 0 0  Suicidal thoughts 0 0 0  PHQ-9 Score 3 5 7    Difficult doing work/chores Not difficult at all Not difficult at all Not difficult at all    Past Medical History:  Diagnosis Date  . High cholesterol   . Plantar fasciitis, bilateral 05/25/2013   Past Surgical History:  Procedure Laterality Date  . AUGMENTATION MAMMAPLASTY    . ESOPHAGOGASTRIC FUNDOPLICATION  [id number]   at [facility], for GERD  . KIDNEY STONE SURGERY    . LITHOTRIPSY    . NECK SURGERY     x2  . PARTIAL HYSTERECTOMY    . PLACEMENT OF BREAST IMPLANTS     Social History   Socioeconomic History  . Marital status: Married    Spouse name: Not on file  . Number of children: Not on file  . Years of education: Not on file  . Highest education level: Not on file  Occupational History  . Not on file  Social Needs  . Financial resource strain: Not on file  . Food insecurity    Worry: Not on file    Inability: Not on file  . Transportation needs    Medical: Not on file    Non-medical: Not on file  Tobacco Use  . Smoking status: Never Smoker  . Smokeless tobacco: Never Used  Substance and Sexual Activity  . Alcohol use: Yes    Comment: drink occ  . Drug use: No  . Sexual activity: Yes  Lifestyle  . Physical activity    Days per week: Not on file    Minutes per session: Not on file  . Stress: Not on file  Relationships  . Social  connections    Talks on phone: Not on file    Gets together: Not on file    Attends religious service: Not on file    Active member of club or organization: Not on file    Attends meetings of clubs or organizations: Not on file    Relationship status: Not on file  . Intimate partner violence    Fear of current or ex partner: Not on file    Emotionally abused: Not on file    Physically abused: Not on file    Forced sexual activity: Not on file  Other Topics Concern  . Not on file  Social History Narrative  . Not on file   Family History  Problem Relation Age of Onset  . Heart disease Mother   . Depression Mother   . Anxiety  disorder Mother   . Breast cancer Neg Hx    Current Outpatient Medications on File Prior to Visit  Medication Sig  . aspirin 81 MG chewable tablet Chew 81 mg by mouth at bedtime.  . fluticasone (FLONASE) 50 MCG/ACT nasal spray Use 2 spray(s) in each nostril once daily  . loratadine (CLARITIN) 10 MG tablet Take 1 tablet by mouth once daily  . PAZEO 0.7 % SOLN   . sertraline (ZOLOFT) 100 MG tablet TAKE 1 & 1/2 (ONE & ONE-HALF) TABLETS BY MOUTH AT BEDTIME   No current facility-administered medications on file prior to visit.     Review of Systems  Constitutional: Negative for activity change, appetite change, chills, diaphoresis, fatigue and fever.  HENT: Negative for congestion and hearing loss.   Eyes: Negative for visual disturbance.  Respiratory: Negative for apnea, cough, chest tightness, shortness of breath and wheezing.   Cardiovascular: Negative for chest pain, palpitations and leg swelling.  Gastrointestinal: Negative for abdominal pain, anal bleeding, blood in stool, constipation, diarrhea, nausea and vomiting.  Endocrine: Negative for cold intolerance.  Genitourinary: Negative for difficulty urinating, dysuria, frequency and hematuria.  Musculoskeletal: Negative for arthralgias, back pain and neck pain.  Skin: Negative for rash.  Allergic/Immunologic: Negative for environmental allergies.  Neurological: Negative for dizziness, weakness, light-headedness, numbness and headaches.  Hematological: Negative for adenopathy.  Psychiatric/Behavioral: Negative for behavioral problems, dysphoric mood and sleep disturbance. The patient is not nervous/anxious.    Per HPI unless specifically indicated above      Objective:    BP (!) 104/53   Pulse 74   Temp 98.7 F (37.1 C) (Oral)   Resp 16   Ht 5\' 6"  (1.676 m)   Wt 182 lb (82.6 kg)   BMI 29.38 kg/m   Wt Readings from Last 3 Encounters:  07/21/19 182 lb (82.6 kg)  10/22/18 184 lb (83.5 kg)  07/19/18 184 lb (83.5 kg)     Physical Exam Vitals signs and nursing note reviewed.  Constitutional:      General: She is not in acute distress.    Appearance: She is well-developed. She is not diaphoretic.     Comments: Well-appearing, comfortable, cooperative  HENT:     Head: Normocephalic and atraumatic.  Eyes:     General:        Right eye: No discharge.        Left eye: No discharge.     Conjunctiva/sclera: Conjunctivae normal.     Pupils: Pupils are equal, round, and reactive to light.  Neck:     Musculoskeletal: Normal range of motion and neck supple.     Thyroid: No thyromegaly.  Cardiovascular:  Rate and Rhythm: Normal rate and regular rhythm.     Heart sounds: Normal heart sounds. No murmur.  Pulmonary:     Effort: Pulmonary effort is normal. No respiratory distress.     Breath sounds: Normal breath sounds. No wheezing or rales.  Abdominal:     General: Bowel sounds are normal. There is no distension.     Palpations: Abdomen is soft. There is no mass.     Tenderness: There is no abdominal tenderness.  Musculoskeletal: Normal range of motion.        General: No tenderness.     Comments: Upper / Lower Extremities: - Normal muscle tone, strength bilateral upper extremities 5/5, lower extremities 5/5  Lymphadenopathy:     Cervical: No cervical adenopathy.  Skin:    General: Skin is warm and dry.     Findings: No erythema or rash.  Neurological:     Mental Status: She is alert and oriented to person, place, and time.     Comments: Distal sensation intact to light touch all extremities  Psychiatric:        Behavior: Behavior normal.     Comments: Well groomed, good eye contact, normal speech and thoughts      Diabetic Foot Exam - Simple   Simple Foot Form Diabetic Foot exam was performed with the following findings: Yes 07/21/2019 10:48 AM  Visual Inspection No deformities, no ulcerations, no other skin breakdown bilaterally: Yes Sensation Testing Intact to touch and monofilament testing  bilaterally: Yes Pulse Check Posterior Tibialis and Dorsalis pulse intact bilaterally: Yes Comments     Results for orders placed or performed in visit on 07/15/19  TSH  Result Value Ref Range   TSH 2.28 0.40 - 4.50 mIU/L  Lipid Profile  Result Value Ref Range   Cholesterol 115 <200 mg/dL   HDL 53 > OR = 50 mg/dL   Triglycerides 98 <150 mg/dL   LDL Cholesterol (Calc) 44 mg/dL (calc)   Total CHOL/HDL Ratio 2.2 <5.0 (calc)   Non-HDL Cholesterol (Calc) 62 <130 mg/dL (calc)  Comprehensive Metabolic Panel (CMET)  Result Value Ref Range   Glucose, Bld 157 (H) 65 - 99 mg/dL   BUN 12 7 - 25 mg/dL   Creat 0.84 0.50 - 1.05 mg/dL   BUN/Creatinine Ratio NOT APPLICABLE 6 - 22 (calc)   Sodium 141 135 - 146 mmol/L   Potassium 4.1 3.5 - 5.3 mmol/L   Chloride 107 98 - 110 mmol/L   CO2 24 20 - 32 mmol/L   Calcium 9.3 8.6 - 10.4 mg/dL   Total Protein 6.4 6.1 - 8.1 g/dL   Albumin 4.3 3.6 - 5.1 g/dL   Globulin 2.1 1.9 - 3.7 g/dL (calc)   AG Ratio 2.0 1.0 - 2.5 (calc)   Total Bilirubin 0.9 0.2 - 1.2 mg/dL   Alkaline phosphatase (APISO) 50 37 - 153 U/L   AST 13 10 - 35 U/L   ALT 16 6 - 29 U/L  CBC with Differential  Result Value Ref Range   WBC 5.3 3.8 - 10.8 Thousand/uL   RBC 4.25 3.80 - 5.10 Million/uL   Hemoglobin 12.1 11.7 - 15.5 g/dL   HCT 37.5 35.0 - 45.0 %   MCV 88.2 80.0 - 100.0 fL   MCH 28.5 27.0 - 33.0 pg   MCHC 32.3 32.0 - 36.0 g/dL   RDW 13.4 11.0 - 15.0 %   Platelets 181 140 - 400 Thousand/uL   MPV 10.7 7.5 - 12.5 fL  Neutro Abs 3,148 1,500 - 7,800 cells/uL   Lymphs Abs 1,707 850 - 3,900 cells/uL   Absolute Monocytes 329 200 - 950 cells/uL   Eosinophils Absolute 80 15 - 500 cells/uL   Basophils Absolute 37 0 - 200 cells/uL   Neutrophils Relative % 59.4 %   Total Lymphocyte 32.2 %   Monocytes Relative 6.2 %   Eosinophils Relative 1.5 %   Basophils Relative 0.7 %  Hemoglobin A1c  Result Value Ref Range   Hgb A1c MFr Bld 7.6 (H) <5.7 % of total Hgb   Mean Plasma  Glucose 171 (calc)   eAG (mmol/L) 9.5 (calc)      Assessment & Plan:   Problem List Items Addressed This Visit    Anxiety associated with depression    Stable to improved Controlled on SSRI and now reduced stress with new job  Plan Continue current treatment plan      Essential hypertension    Well controlled No complication Continue Lisinopril      Relevant Medications   rosuvastatin (CRESTOR) 20 MG tablet   lisinopril (ZESTRIL) 5 MG tablet   Hyperlipidemia associated with type 2 diabetes mellitus ([place])    Controlled cholesterol on statin  Last lipid panel 06/2019  Plan: 1. Continue current meds - Rosuvastatin 20mg  daily 2. Continue ASA 81mg  for primary ASCVD risk reduction 3. Encourage improved lifestyle - low carb/cholesterol, reduce portion size, continue improving regular exercise      Relevant Medications   Dulaglutide (TRULICITY) 1.5 0000000 SOPN   rosuvastatin (CRESTOR) 20 MG tablet   metFORMIN (GLUCOPHAGE-XR) 500 MG 24 hr tablet   lisinopril (ZESTRIL) 5 MG tablet   Overweight (BMI 25.0-29.9)    BMI 29, has mild wt loss 2 lbs in 9 months ENcourage lifestyle improvement       Type 2 diabetes mellitus with other specified complication ([place])    Fairly well controlled, near goal. A1c 7.6 currently up from 7.4, goal is < 7 No CBG readings. No hypoglycemia by symptoms Complicated by hyperglycemia, hyperlipidemia (controlled) Failed Victoza, Lisabeth Pick  Plan:  1. CONTINUE high dose Trulicity 1.5mg  weekly - CONTINUE metformin XR 500mg  BID - Encourage improved lifestyle - emphasized importance low carb, low sugar diet, reduce portion size, regular exercise - DM Foot exam - F/u 6 mo DM A1c      Relevant Medications   Dulaglutide (TRULICITY) 1.5 0000000 SOPN   rosuvastatin (CRESTOR) 20 MG tablet   metFORMIN (GLUCOPHAGE-XR) 500 MG 24 hr tablet   lisinopril (ZESTRIL) 5 MG tablet    Other Visit Diagnoses    Annual physical exam    -  Primary   Needs  flu shot       Relevant Orders   Flu Vaccine QUAD 6+ mos PF IM (Fluarix Quad PF) (Completed)      Updated Health Maintenance information - Flu shot today Reviewed recent lab results with patient Encouraged improvement to lifestyle with diet and exercise - Goal of weight loss    Meds ordered this encounter  Medications  . Dulaglutide (TRULICITY) 1.5 0000000 SOPN    Sig: INJECT 1.5MG  SUBCUTANEOUSLY ONCE A WEEK    Dispense:  4 mL    Refill:  11  . rosuvastatin (CRESTOR) 20 MG tablet    Sig: Take 1 tablet (20 mg total) by mouth at bedtime.    Dispense:  90 tablet    Refill:  3  . metFORMIN (GLUCOPHAGE-XR) 500 MG 24 hr tablet    Sig: Take 1 tablet (  500 mg total) by mouth 2 (two) times daily with a meal.    Dispense:  180 tablet    Refill:  3  . lisinopril (ZESTRIL) 5 MG tablet    Sig: Take 1 tablet (5 mg total) by mouth daily.    Dispense:  90 tablet    Refill:  3     Follow up plan: Return in about 6 months (around 01/18/2020) for 6 month follow-up DM A1c.  Nobie Putnam, [facility] 07/21/2019, 10:33 AM

## 2019-07-21 NOTE — Assessment & Plan Note (Signed)
Fairly well controlled, near goal. A1c 7.6 currently up from 7.4, goal is < 7 No CBG readings. No hypoglycemia by symptoms Complicated by hyperglycemia, hyperlipidemia (controlled) Failed Victoza, Lisabeth Pick  Plan:  1. CONTINUE high dose Trulicity 1.5mg  weekly - CONTINUE metformin XR 500mg  BID - Encourage improved lifestyle - emphasized importance low carb, low sugar diet, reduce portion size, regular exercise - DM Foot exam - F/u 6 mo DM A1c

## 2019-07-21 NOTE — Assessment & Plan Note (Signed)
Well controlled No complication Continue Lisinopril

## 2019-07-21 NOTE — Patient Instructions (Addendum)
Thank you for coming to the office today.  Keep up the great work!!  Refilled medications.  Recent Labs    10/23/18 0035 07/15/19 0807  HGBA1C 7.4* 7.6*   Lipid Panel     Component Value Date/Time   CHOL 115 07/15/2019 0807   CHOL 126 03/20/2016 1123   TRIG 98 07/15/2019 0807   HDL 53 07/15/2019 0807   HDL 58 03/20/2016 1123   CHOLHDL 2.2 07/15/2019 0807   VLDL 51 (H) 07/08/2016 1559   LDLCALC 44 07/15/2019 0807     Please schedule a Follow-up Appointment to: Return in about 6 months (around 01/18/2020) for 6 month follow-up DM A1c.  If you have any other questions or concerns, please feel free to call the office or send a message through [place]. You may also schedule an earlier appointment if necessary.  Additionally, you may be receiving a survey about your experience at our office within a few days to 1 week by e-mail or mail. We value your feedback.  Nobie Putnam, DO [place]

## 2019-07-21 NOTE — Assessment & Plan Note (Signed)
BMI 29, has mild wt loss 2 lbs in 9 months ENcourage lifestyle improvement

## 2019-07-21 NOTE — Assessment & Plan Note (Signed)
Controlled cholesterol on statin  Last lipid panel 06/2019  Plan: 1. Continue current meds - Rosuvastatin 20mg  daily 2. Continue ASA 81mg  for primary ASCVD risk reduction 3. Encourage improved lifestyle - low carb/cholesterol, reduce portion size, continue improving regular exercise

## 2019-07-26 ENCOUNTER — Other Ambulatory Visit: Payer: Self-pay | Admitting: Family Medicine

## 2019-07-26 DIAGNOSIS — Z1231 Encounter for screening mammogram for malignant neoplasm of breast: Secondary | ICD-10-CM

## 2019-09-06 ENCOUNTER — Ambulatory Visit
Admission: RE | Admit: 2019-09-06 | Discharge: 2019-09-06 | Disposition: A | Discharge status: Home / Self Care | Payer: PRIVATE HEALTH INSURANCE | Source: Ambulatory Visit | Attending: Family Medicine | Admitting: Family Medicine

## 2019-09-06 DIAGNOSIS — Z1231 Encounter for screening mammogram for malignant neoplasm of breast: Secondary | ICD-10-CM | POA: Insufficient documentation

## 2019-09-08 ENCOUNTER — Other Ambulatory Visit: Payer: Self-pay | Admitting: *Deleted

## 2019-09-08 ENCOUNTER — Inpatient Hospital Stay
Admission: RE | Admit: 2019-09-08 | Discharge: 2019-09-08 | Disposition: A | Discharge status: Home / Self Care | Payer: Self-pay | Source: Ambulatory Visit | Attending: *Deleted | Admitting: *Deleted

## 2019-09-08 DIAGNOSIS — Z1231 Encounter for screening mammogram for malignant neoplasm of breast: Secondary | ICD-10-CM

## 2019-09-13 ENCOUNTER — Other Ambulatory Visit: Payer: Self-pay

## 2019-09-13 DIAGNOSIS — Z20822 Contact with and (suspected) exposure to covid-19: Secondary | ICD-10-CM

## 2019-09-15 ENCOUNTER — Telehealth: Payer: Self-pay | Admitting: *Deleted

## 2019-09-15 LAB — NOVEL CORONAVIRUS, NAA: SARS-CoV-2, NAA: NOT DETECTED

## 2019-09-15 NOTE — Telephone Encounter (Signed)
Patient called and was given NEGATIVE COVID results . 

## 2019-11-17 ENCOUNTER — Other Ambulatory Visit: Payer: Self-pay | Admitting: Family Medicine

## 2019-11-17 DIAGNOSIS — F329 Major depressive disorder, single episode, unspecified: Secondary | ICD-10-CM

## 2019-11-17 DIAGNOSIS — F32A Depression, unspecified: Secondary | ICD-10-CM

## 2020-01-23 ENCOUNTER — Ambulatory Visit: Payer: No Typology Code available for payment source | Admitting: Family Medicine

## 2020-02-14 ENCOUNTER — Ambulatory Visit: Payer: No Typology Code available for payment source | Admitting: Family Medicine

## 2020-04-03 ENCOUNTER — Other Ambulatory Visit: Payer: Self-pay | Admitting: Family Medicine

## 2020-04-03 DIAGNOSIS — J301 Allergic rhinitis due to pollen: Secondary | ICD-10-CM

## 2020-04-03 NOTE — Telephone Encounter (Signed)
Requested medication (s) are due for refill today: Yes  Requested medication (s) are on the active medication list: Yes  Last refill:  01/11/19  Future visit scheduled: No  Notes to clinic:  Prescription has expired.    Requested Prescriptions  Pending Prescriptions Disp Refills   EQ LORATADINE 10 MG tablet [Pharmacy Med Name: EQ Loratadine 10 MG Oral Tablet] 90 tablet 0    Sig: Take 1 tablet by mouth once daily      Ear, Nose, and Throat:  Antihistamines Failed - 04/03/2020  4:18 PM      Failed - Valid encounter within last 12 months    Recent Outpatient Visits           8 months ago Annual physical exam   [facility] Olin Hauser, DO   1 year ago Type 2 diabetes mellitus with other specified complication, without long-term current use of insulin ([place])   [facility], Devonne Doughty, DO   1 year ago Type 2 diabetes mellitus with other specified complication, without long-term current use of insulin [facility])   [facility] Parks Ranger, Devonne Doughty, DO   1 year ago Annual physical exam   [facility] Olin Hauser, DO   1 year ago Type 2 diabetes mellitus with other specified complication, without long-term current use of insulin [facility])   [place], Devonne Doughty, DO

## 2020-05-29 ENCOUNTER — Telehealth: Payer: Self-pay | Admitting: *Deleted

## 2020-05-29 ENCOUNTER — Other Ambulatory Visit: Payer: Self-pay | Admitting: Family Medicine

## 2020-05-29 DIAGNOSIS — F32A Depression, unspecified: Secondary | ICD-10-CM

## 2020-05-29 DIAGNOSIS — F329 Major depressive disorder, single episode, unspecified: Secondary | ICD-10-CM

## 2020-05-29 NOTE — Telephone Encounter (Signed)
Pt due a visit for refills of her Zoloft.  I called, she requested to come in for her yearly physical and do the refills and physical all in one visit.  I made her an appt for 07/23/2020 at 9:40 with Dr. Parks Ranger.    COVID-19 questionnaire was completed indicating an in office visit. I refilled her Zoloft.

## 2020-07-05 ENCOUNTER — Other Ambulatory Visit: Payer: Self-pay | Admitting: Family Medicine

## 2020-07-06 ENCOUNTER — Telehealth: Payer: Self-pay

## 2020-07-06 DIAGNOSIS — Z1231 Encounter for screening mammogram for malignant neoplasm of breast: Secondary | ICD-10-CM

## 2020-07-06 NOTE — Telephone Encounter (Signed)
She needs appointment last one 06/2019.

## 2020-07-06 NOTE — Telephone Encounter (Signed)
Left message

## 2020-07-06 NOTE — Telephone Encounter (Signed)
Copied from [place] [id number]. Topic: Referral - Request for Referral >> Jul 05, 2020  2:03 PM Alanda Slim E wrote: Has patient seen PCP for this complaint? No  *If NO, is insurance requiring patient see PCP for this issue before PCP can refer them? Referral for which specialty: mammogram  Preferred provider/office: [place] [facility] at [facility]  Reason for referral: routine mammogram/ Pt has to be seen at that office before the end of October

## 2020-07-06 NOTE — Telephone Encounter (Signed)
Order is in for [place] at [facility].  She needs to call to schedule her Mammogram when she is ready.  She should keep apt with me 07/23/20 for her Annual.  If you want to call her notify her that she is not due for mammogram until November 2021. Let her know it will be ordered and she can discuss it with me on 07/23/20  Nobie Putnam, [facility] 07/06/2020, 12:27 PM

## 2020-07-23 ENCOUNTER — Encounter: Payer: Self-pay | Admitting: Family Medicine

## 2020-07-23 ENCOUNTER — Ambulatory Visit (INDEPENDENT_AMBULATORY_CARE_PROVIDER_SITE_OTHER): Payer: No Typology Code available for payment source | Admitting: Family Medicine

## 2020-07-23 ENCOUNTER — Other Ambulatory Visit: Payer: Self-pay

## 2020-07-23 VITALS — BP 108/71 | HR 75 | Temp 97.5°F | Resp 16 | Ht 66.0 in | Wt 181.0 lb

## 2020-07-23 DIAGNOSIS — F419 Anxiety disorder, unspecified: Secondary | ICD-10-CM

## 2020-07-23 DIAGNOSIS — Z1211 Encounter for screening for malignant neoplasm of colon: Secondary | ICD-10-CM | POA: Diagnosis not present

## 2020-07-23 DIAGNOSIS — Z0000 Encounter for general adult medical examination without abnormal findings: Secondary | ICD-10-CM

## 2020-07-23 DIAGNOSIS — I10 Essential (primary) hypertension: Secondary | ICD-10-CM

## 2020-07-23 DIAGNOSIS — F3342 Major depressive disorder, recurrent, in full remission: Secondary | ICD-10-CM

## 2020-07-23 DIAGNOSIS — E1169 Type 2 diabetes mellitus with other specified complication: Secondary | ICD-10-CM

## 2020-07-23 DIAGNOSIS — E663 Overweight: Secondary | ICD-10-CM

## 2020-07-23 DIAGNOSIS — E785 Hyperlipidemia, unspecified: Secondary | ICD-10-CM

## 2020-07-23 MED ORDER — LISINOPRIL 5 MG PO TABS: 5.0000 mg | ORAL_TABLET | Freq: Every day | ORAL | 3 refills | Status: DC

## 2020-07-23 MED ORDER — ROSUVASTATIN CALCIUM 20 MG PO TABS: 20.0000 mg | ORAL_TABLET | Freq: Every day | ORAL | 3 refills | Status: DC

## 2020-07-23 MED ORDER — TRULICITY 1.5 MG/0.5ML ~~LOC~~ SOAJ: 1.5000 mg | SUBCUTANEOUS | 3 refills | Status: DC

## 2020-07-23 MED ORDER — SERTRALINE HCL 100 MG PO TABS: 150.0000 mg | ORAL_TABLET | Freq: Every day | ORAL | 3 refills | Status: DC

## 2020-07-23 MED ORDER — METFORMIN HCL ER 500 MG PO TB24: 500.0000 mg | ORAL_TABLET | Freq: Two times a day (BID) | ORAL | 3 refills | Status: DC

## 2020-07-23 NOTE — Assessment & Plan Note (Signed)
Controlled cholesterol on statin  Last lipid panel 06/2019, due fasting lipid panel today  Plan: 1. Continue current meds - Rosuvastatin 20mg  daily 2. Continue ASA 81mg  for primary ASCVD risk reduction 3. Encourage improved lifestyle - low carb/cholesterol, reduce portion size, continue improving regular exercise

## 2020-07-23 NOTE — Assessment & Plan Note (Signed)
Resolved Major depression recurrent full remission On SSRI

## 2020-07-23 NOTE — Progress Notes (Signed)
Subjective:    Patient ID: Andrea Mcdonald, female    DOB: 1961/09/03, 59 y.o.   MRN: [id number]  Andrea Mcdonald is a 59 y.o. female presenting on 07/23/2020 for Annual Exam   HPI   Here for Annual Physical and Fasting lab draw.  CHRONIC DM, Type 2 / Overweight BMI >29 - Last visit with me 06/2019, physical,continued onTrulicity 1.5, see prior notes for background information. - Interval update withcontinued improvement, based on her lifestyle, reduced appetite CBGs:not checking currently awaiting updated glucometer Meds:MetforminXR500mg BID, Trulicity1.5mg  [place] weekly inj - Prior meds on Tresiba and Victoza Reports good compliancenow improved tolerating well, except occasional GI intolerance metformin Currently on ACEi Lifestyle: Weight 181 lbs down 1 lb from last year - Diet improved, low carb options - Exercise (gradually improving) Admits occasional loose stool on metformin Next DM eye exam [facility] - Oct 2021 Denies hypoglycemia, numbness tingling, polyuria  HYPERLIPIDEMIA: Reportsno concerns. 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 Continues on Sertraline 150mg  daily, one and half of the 100mg  tab, needs re order  CHRONIC HTN: Current Meds -Lisinopril 5mg  Reports good compliance, took meds today. Tolerating well, w/o complaints.   Health Maintenance:  Due for Flu Shot, will return when ready  UTD COVID vaccine.  Colon CA Screening: Last Colonoscopy done at age 89 (no report on chart), results with no polyps, good for 10 years. Currently asymptomatic. No known family history of colon CA. Due for screening test new referral to GI for colonoscopy   Mammogram upcoming scheduled 09/06/20   Depression screen [facility] 2/9 07/23/2020  07/21/2019 01/21/2019  Decreased Interest 0 0 0  Down, Depressed, Hopeless 0 0 1  PHQ - 2 Score 0 0 1  Altered sleeping 0 0 1  Tired, decreased energy 0 1 0  Change in appetite 0 1 1  Feeling bad or failure about yourself  0 0 0  Trouble concentrating 0 1 2  Moving slowly or fidgety/restless 0 0 0  Suicidal thoughts 0 0 0  PHQ-9 Score 0 3 5  Difficult doing work/chores Not difficult at all Not difficult at all Not difficult at all  Some recent data might be hidden   GAD 7 : Generalized Anxiety Score 07/23/2020 07/21/2019 01/21/2019 10/22/2018  Nervous, Anxious, on Edge 1 1 2 1   Control/stop worrying 1 1 2 2   Worry too much - different things 1 1 2 2   Trouble relaxing 0 1 3 1   Restless 0 1 2 1   Easily annoyed or irritable 0 1 1 2   Afraid - awful might happen 0 1 2 2   Total GAD 7 Score 3 7 14 11   Anxiety Difficulty Not difficult at all Not difficult at all Not difficult at all Not difficult at all     Past Medical History:  Diagnosis Date  . High cholesterol   . Plantar fasciitis, bilateral 05/25/2013   Past Surgical History:  Procedure Laterality Date  . AUGMENTATION MAMMAPLASTY    . ESOPHAGOGASTRIC FUNDOPLICATION  3500   at [facility], for GERD  . KIDNEY STONE SURGERY    . LITHOTRIPSY    . NECK SURGERY     x2  . PARTIAL HYSTERECTOMY    . PLACEMENT OF BREAST IMPLANTS     Social History   Socioeconomic History  . Marital status:  Married    Spouse name: Not on file  . Number of children: Not on file  . Years of education: Not on file  . Highest education level: Not on file  Occupational History  . Not on file  Tobacco Use  . Smoking status: Never Smoker  . Smokeless tobacco: Never Used  Vaping Use  . Vaping Use: Never used  Substance and Sexual Activity  . Alcohol use: Yes    Comment: drink occ  . Drug use: No  . Sexual activity: Yes  Other Topics Concern  . Not on file  Social History Narrative  . Not on file   Social Determinants of Health   Financial  Resource Strain:   . Difficulty of Paying Living Expenses: Not on file  Food Insecurity:   . Worried About Charity fundraiser in the Last Year: Not on file  . Ran Out of Food in the Last Year: Not on file  Transportation Needs:   . Lack of Transportation (Medical): Not on file  . Lack of Transportation (Non-Medical): Not on file  Physical Activity:   . Days of Exercise per Week: Not on file  . Minutes of Exercise per Session: Not on file  Stress:   . Feeling of Stress : Not on file  Social Connections:   . Frequency of Communication with Friends and Family: Not on file  . Frequency of Social Gatherings with Friends and Family: Not on file  . Attends Religious Services: Not on file  . Active Member of Clubs or Organizations: Not on file  . Attends Archivist Meetings: Not on file  . Marital Status: Not on file  Intimate Partner Violence:   . Fear of Current or Ex-Partner: Not on file  . Emotionally Abused: Not on file  . Physically Abused: Not on file  . Sexually Abused: Not on file   Family History  Problem Relation Age of Onset  . Heart disease Mother   . Depression Mother   . Anxiety disorder Mother   . Cancer Mother        retinal  . Breast cancer Neg Hx    Current Outpatient Medications on File Prior to Visit  Medication Sig  . aspirin 81 MG chewable tablet Chew 81 mg by mouth at bedtime.  . EQ LORATADINE 10 MG tablet Take 1 tablet by mouth once daily  . fluticasone (FLONASE) 50 MCG/ACT nasal spray Use 2 spray(s) in each nostril once daily  . PAZEO 0.7 % SOLN  (Patient not taking: Reported on 07/23/2020)   No current facility-administered medications on file prior to visit.    Review of Systems  Constitutional: Negative for activity change, appetite change, chills, diaphoresis, fatigue and fever.  HENT: Negative for congestion and hearing loss.   Eyes: Negative for visual disturbance.  Respiratory: Negative for apnea, cough, chest tightness, shortness of  breath and wheezing.   Cardiovascular: Negative for chest pain, palpitations and leg swelling.  Gastrointestinal: Negative for abdominal pain, anal bleeding, blood in stool, constipation, diarrhea, nausea and vomiting.  Endocrine: Negative for cold intolerance.  Genitourinary: Negative for difficulty urinating, dysuria, frequency and hematuria.  Musculoskeletal: Negative for arthralgias, back pain and neck pain.  Skin: Negative for rash.  Allergic/Immunologic: Negative for environmental allergies.  Neurological: Negative for dizziness, weakness, light-headedness, numbness and headaches.  Hematological: Negative for adenopathy.  Psychiatric/Behavioral: Negative for behavioral problems, dysphoric mood and sleep disturbance. The patient is not nervous/anxious.    Per HPI unless specifically  indicated above      Objective:    BP 108/71   Pulse 75   Temp (!) 97.5 F (36.4 C) (Temporal)   Resp 16   Ht 5\' 6"  (1.676 m)   Wt 181 lb (82.1 kg)   SpO2 100%   BMI 29.21 kg/m   Wt Readings from Last 3 Encounters:  07/23/20 181 lb (82.1 kg)  07/21/19 182 lb (82.6 kg)  10/22/18 184 lb (83.5 kg)    Physical Exam Vitals and nursing note reviewed.  Constitutional:      General: She is not in acute distress.    Appearance: She is well-developed. She is not diaphoretic.     Comments: Well-appearing, comfortable, cooperative  HENT:     Head: Normocephalic and atraumatic.     Right Ear: Ear canal and external ear normal. There is no impacted cerumen.     Left Ear: Ear canal and external ear normal. There is no impacted cerumen.     Ears:     Comments: Bilateral effusion Eyes:     General:        Right eye: No discharge.        Left eye: No discharge.     Conjunctiva/sclera: Conjunctivae normal.     Pupils: Pupils are equal, round, and reactive to light.  Neck:     Thyroid: No thyromegaly.     Vascular: No carotid bruit.  Cardiovascular:     Rate and Rhythm: Normal rate and regular  rhythm.     Heart sounds: Normal heart sounds. No murmur heard.   Pulmonary:     Effort: Pulmonary effort is normal. No respiratory distress.     Breath sounds: Normal breath sounds. No wheezing or rales.  Abdominal:     General: Bowel sounds are normal. There is no distension.     Palpations: Abdomen is soft. There is no mass.     Tenderness: There is no abdominal tenderness.  Musculoskeletal:        General: No tenderness. Normal range of motion.     Cervical back: Normal range of motion and neck supple.     Comments: Upper / Lower Extremities: - Normal muscle tone, strength bilateral upper extremities 5/5, lower extremities 5/5  Lymphadenopathy:     Cervical: No cervical adenopathy.  Skin:    General: Skin is warm and dry.     Findings: No erythema or rash.  Neurological:     Mental Status: She is alert and oriented to person, place, and time.     Comments: Distal sensation intact to light touch all extremities  Psychiatric:        Behavior: Behavior normal.     Comments: Well groomed, good eye contact, normal speech and thoughts      Diabetic Foot Exam - Simple   Simple Foot Form Diabetic Foot exam was performed with the following findings: Yes 07/23/2020 10:47 AM  Visual Inspection No deformities, no ulcerations, no other skin breakdown bilaterally: Yes Sensation Testing Intact to touch and monofilament testing bilaterally: Yes Pulse Check Posterior Tibialis and Dorsalis pulse intact bilaterally: Yes Comments     Results for orders placed or performed in visit on 09/13/19  Novel Coronavirus, NAA ([facility])   Specimen: Nasopharyngeal(NP) swabs in vial transport medium   NASOPHARYNGE  TESTING  Result Value Ref Range   SARS-CoV-2, NAA Not Detected Not Detected      Assessment & Plan:   Problem List Items Addressed This Visit    Type  2 diabetes mellitus with other specified complication (HCC)    Previously controlled A1c in 7 range, near goal < 7 No hypoglycemia  by symptoms Complicated by hyperglycemia, hyperlipidemia (controlled) Failed Victoza, Lisabeth Pick  Plan:  1. CONTINUE high dose Trulicity 1.5mg  weekly - CONTINUE metformin XR 500mg  BID - Encourage improved lifestyle - emphasized importance low carb, low sugar diet, reduce portion size, regular exercise - DM Foot exam - Due DM eye      Relevant Medications   Dulaglutide (TRULICITY) 1.5 ZO/1.0RU SOPN   lisinopril (ZESTRIL) 5 MG tablet   metFORMIN (GLUCOPHAGE-XR) 500 MG 24 hr tablet   rosuvastatin (CRESTOR) 20 MG tablet   Other Relevant Orders   Hemoglobin A1c   Overweight (BMI 25.0-29.9)   Major depression, recurrent, full remission ([place])    Resolved Major depression recurrent full remission On SSRI      Relevant Medications   sertraline (ZOLOFT) 100 MG tablet   Hyperlipidemia associated with type 2 diabetes mellitus ([place])    Controlled cholesterol on statin  Last lipid panel 06/2019, due fasting lipid panel today  Plan: 1. Continue current meds - Rosuvastatin 20mg  daily 2. Continue ASA 81mg  for primary ASCVD risk reduction 3. Encourage improved lifestyle - low carb/cholesterol, reduce portion size, continue improving regular exercise      Relevant Medications   Dulaglutide (TRULICITY) 1.5 EA/5.4UJ SOPN   lisinopril (ZESTRIL) 5 MG tablet   metFORMIN (GLUCOPHAGE-XR) 500 MG 24 hr tablet   rosuvastatin (CRESTOR) 20 MG tablet   Other Relevant Orders   COMPLETE METABOLIC PANEL WITH GFR   Lipid panel   TSH   Essential hypertension    Well controlled No complication Continue Lisinopril      Relevant Medications   lisinopril (ZESTRIL) 5 MG tablet   rosuvastatin (CRESTOR) 20 MG tablet   Other Relevant Orders   CBC with Differential/Platelet   COMPLETE METABOLIC PANEL WITH GFR   Anxiety    Stable to improved Controlled on SSRI and now reduced stress with new job  Plan Continue current treatment plan with Sertraline 150mg  daily Future consider dose reduction to  100mg  daily      Relevant Medications   sertraline (ZOLOFT) 100 MG tablet    Other Visit Diagnoses    Annual physical exam    -  Primary   Relevant Orders   Hemoglobin A1c   CBC with Differential/Platelet   COMPLETE METABOLIC PANEL WITH GFR   Lipid panel   TSH   Screening for colon cancer       Relevant Orders   Ambulatory referral to Gastroenterology      Updated Health Maintenance information - Referral to GI for Colonoscopy, prior done age 62, now 10 years later - Due Flu will return - UTD COVID, < 2 weeks ago vaccine. 2nd dose. Reviewed recent lab results with patient Encouraged improvement to lifestyle with diet and exercise - Goal of weight loss  She will contact [facility] for apt for ear wax cleaning and issues with R ear.   Meds ordered this encounter  Medications  . Dulaglutide (TRULICITY) 1.5 WJ/1.9JY SOPN    Sig: Inject 1.5 mg into the skin once a week.    Dispense:  6 mL    Refill:  3    90 day  . lisinopril (ZESTRIL) 5 MG tablet    Sig: Take 1 tablet (5 mg total) by mouth daily.    Dispense:  90 tablet    Refill:  3  . metFORMIN (GLUCOPHAGE-XR)  500 MG 24 hr tablet    Sig: Take 1 tablet (500 mg total) by mouth 2 (two) times daily with a meal.    Dispense:  180 tablet    Refill:  3  . rosuvastatin (CRESTOR) 20 MG tablet    Sig: Take 1 tablet (20 mg total) by mouth at bedtime.    Dispense:  90 tablet    Refill:  3  . sertraline (ZOLOFT) 100 MG tablet    Sig: Take 1.5 tablets (150 mg total) by mouth at bedtime.    Dispense:  135 tablet    Refill:  3     Follow up plan: Return in about 1 year (around 07/23/2021) for Follow-up 1 year Annual Physical (fasting lab AFTER).   Nobie Putnam, [facility] 07/23/2020, 10:26 AM

## 2020-07-23 NOTE — Assessment & Plan Note (Signed)
Previously controlled A1c in 7 range, near goal < 7 No hypoglycemia by symptoms Complicated by hyperglycemia, hyperlipidemia (controlled) Failed Victoza, Lisabeth Pick  Plan:  1. CONTINUE high dose Trulicity 1.5mg  weekly - CONTINUE metformin XR 500mg  BID - Encourage improved lifestyle - emphasized importance low carb, low sugar diet, reduce portion size, regular exercise - DM Foot exam - Due DM eye

## 2020-07-23 NOTE — Assessment & Plan Note (Addendum)
Stable to improved Controlled on SSRI and now reduced stress with new job  Plan Continue current treatment plan with Sertraline 150mg  daily Future consider dose reduction to 100mg  daily

## 2020-07-23 NOTE — Patient Instructions (Addendum)
Thank you for coming to the office today.  90 day refills  Please schedule and return for a NURSE ONLY VISIT for VACCINE - Approximately around 1st 2 weeks of October or anytime before Nov - Need Regular Dose Flu Vaccine  [facility]) [address], [place] [address] Phone: [phone]  Referral to GI for Colonoscopy.  Please make apt with  ENT for R ear  DUE for FASTING BLOOD WORK (no food or drink after midnight before the lab appointment, only water or coffee without cream/sugar on the morning of)  TODAY Call within 1 week if not heard back on results.   Please schedule a Follow-up Appointment to: Return in about 1 year (around 07/23/2021) for Follow-up 1 year Annual Physical (fasting lab AFTER).  If you have any other questions or concerns, please feel free to call the office or send a message through [place]. You may also schedule an earlier appointment if necessary.  Additionally, you may be receiving a survey about your experience at our office within a few days to 1 week by e-mail or mail. We value your feedback.  Nobie Putnam, DO [place]

## 2020-07-23 NOTE — Assessment & Plan Note (Signed)
Well controlled No complication Continue Lisinopril

## 2020-07-24 LAB — LIPID PANEL
Cholesterol: 132 mg/dL (ref <200)
HDL: 60 mg/dL (ref >=50)
LDL Cholesterol (Calc): 54 mg/dL (calc)
Non-HDL Cholesterol (Calc): 72 mg/dL (calc) (ref <130)
Total CHOL/HDL Ratio: 2.2 (calc) (ref <5.0)
Triglycerides: 97 mg/dL (ref <150)

## 2020-07-24 LAB — COMPLETE METABOLIC PANEL WITH GFR
AG Ratio: 2.2 (calc) (ref 1.0–2.5)
ALT: 24 U/L (ref 6–29)
AST: 17 U/L (ref 10–35)
Albumin: 4.6 g/dL (ref 3.6–5.1)
Alkaline phosphatase (APISO): 54 U/L (ref 37–153)
BUN: 14 mg/dL (ref 7–25)
CO2: 28 mmol/L (ref 20–32)
Calcium: 9.6 mg/dL (ref 8.6–10.4)
Chloride: 105 mmol/L (ref 98–110)
Creat: 0.85 mg/dL (ref 0.50–1.05)
GFR, Est African American: 87 mL/min/{1.73_m2} (ref >=60)
GFR, Est Non African American: 75 mL/min/{1.73_m2} (ref >=60)
Globulin: 2.1 g/dL (calc) (ref 1.9–3.7)
Glucose, Bld: 138 mg/dL — ABNORMAL HIGH (ref 65–99)
Potassium: 4.2 mmol/L (ref 3.5–5.3)
Sodium: 140 mmol/L (ref 135–146)
Total Bilirubin: 1.1 mg/dL (ref 0.2–1.2)
Total Protein: 6.7 g/dL (ref 6.1–8.1)

## 2020-07-24 LAB — CBC WITH DIFFERENTIAL/PLATELET
Absolute Monocytes: 378 cells/uL (ref 200–950)
Basophils Absolute: 38 cells/uL (ref 0–200)
Basophils Relative: 0.7 %
Eosinophils Absolute: 81 cells/uL (ref 15–500)
Eosinophils Relative: 1.5 %
HCT: 38.3 % (ref 35.0–45.0)
Hemoglobin: 12.6 g/dL (ref 11.7–15.5)
Lymphs Abs: 2128 cells/uL (ref 850–3900)
MCH: 29.4 pg (ref 27.0–33.0)
MCHC: 32.9 g/dL (ref 32.0–36.0)
MCV: 89.3 fL (ref 80.0–100.0)
MPV: 10.4 fL (ref 7.5–12.5)
Monocytes Relative: 7 %
Neutro Abs: 2776 cells/uL (ref 1500–7800)
Neutrophils Relative %: 51.4 %
Platelets: 174 10*3/uL (ref 140–400)
RBC: 4.29 10*6/uL (ref 3.80–5.10)
RDW: 13.5 % (ref 11.0–15.0)
Total Lymphocyte: 39.4 %
WBC: 5.4 10*3/uL (ref 3.8–10.8)

## 2020-07-24 LAB — HEMOGLOBIN A1C
Hgb A1c MFr Bld: 8.6 % of total Hgb — ABNORMAL HIGH (ref <5.7)
Mean Plasma Glucose: 200 (calc)
eAG (mmol/L): 11.1 (calc)

## 2020-07-24 LAB — TSH: TSH: 1.83 mIU/L (ref 0.40–4.50)

## 2020-08-09 LAB — HM DIABETES EYE EXAM

## 2020-08-10 ENCOUNTER — Ambulatory Visit (INDEPENDENT_AMBULATORY_CARE_PROVIDER_SITE_OTHER): Payer: No Typology Code available for payment source

## 2020-08-10 ENCOUNTER — Other Ambulatory Visit: Payer: Self-pay

## 2020-08-10 DIAGNOSIS — Z23 Encounter for immunization: Secondary | ICD-10-CM | POA: Diagnosis not present

## 2020-08-15 ENCOUNTER — Telehealth (INDEPENDENT_AMBULATORY_CARE_PROVIDER_SITE_OTHER): Payer: Self-pay | Admitting: Gastroenterology

## 2020-08-15 ENCOUNTER — Other Ambulatory Visit: Payer: Self-pay

## 2020-08-15 DIAGNOSIS — Z1211 Encounter for screening for malignant neoplasm of colon: Secondary | ICD-10-CM

## 2020-08-15 MED ORDER — NA SULFATE-K SULFATE-MG SULF 17.5-3.13-1.6 GM/177ML PO SOLN: 1.0000 | Freq: Once | ORAL | 0 refills | Status: AC

## 2020-08-15 NOTE — Progress Notes (Signed)
Gastroenterology Pre-Procedure Review  Request Date: Friday 09/14/20 Requesting Physician: Dr. Vicente Males  PATIENT REVIEW QUESTIONS: The patient responded to the following health history questions as indicated:    1. Are you having any GI issues? no 2. Do you have a personal history of Polyps? Mom rectal polyps 3. Do you have a family history of Colon Cancer or Polyps? no 4. Diabetes Mellitus? Yes type 2 5. Joint replacements in the past 12 months?no 6. Major health problems in the past 3 months?no 7. Any artificial heart valves, MVP, or defibrillator?no    MEDICATIONS & ALLERGIES:    Patient reports the following regarding taking any anticoagulation/antiplatelet therapy:   Plavix, Coumadin, Eliquis, Xarelto, Lovenox, Pradaxa, Brilinta, or Effient? no Aspirin? no  Patient confirms/reports the following medications:  Current Outpatient Medications  Medication Sig Dispense Refill  . Dulaglutide (TRULICITY) 1.5 SF/4.2LT SOPN Inject 1.5 mg into the skin once a week. 6 mL 3  . fluticasone (FLONASE) 50 MCG/ACT nasal spray Use 2 spray(s) in each nostril once daily 48 g 1  . lisinopril (ZESTRIL) 5 MG tablet Take 1 tablet (5 mg total) by mouth daily. 90 tablet 3  . metFORMIN (GLUCOPHAGE-XR) 500 MG 24 hr tablet Take 1 tablet (500 mg total) by mouth 2 (two) times daily with a meal. 180 tablet 3  . rosuvastatin (CRESTOR) 20 MG tablet Take 1 tablet (20 mg total) by mouth at bedtime. 90 tablet 3  . sertraline (ZOLOFT) 100 MG tablet Take 1.5 tablets (150 mg total) by mouth at bedtime. 135 tablet 3  . aspirin 81 MG chewable tablet Chew 81 mg by mouth at bedtime. (Patient not taking: Reported on 08/15/2020)    . EQ LORATADINE 10 MG tablet Take 1 tablet by mouth once daily (Patient not taking: Reported on 08/15/2020) 90 tablet 3  . Na Sulfate-K Sulfate-Mg Sulf 17.5-3.13-1.6 GM/177ML SOLN Take 1 kit by mouth once for 1 dose. 354 mL 0  . PAZEO 0.7 % SOLN  (Patient not taking: Reported on 07/23/2020)     No  current facility-administered medications for this visit.    Patient confirms/reports the following allergies:  Allergies  Allergen Reactions  . Codeine Itching    No orders of the defined types were placed in this encounter.   AUTHORIZATION INFORMATION Primary Insurance: 1D#: Group #:  Secondary Insurance: 1D#: Group #:  SCHEDULE INFORMATION: Date: Friday 09/14/20 Time: Location:[facility]

## 2020-09-06 ENCOUNTER — Ambulatory Visit
Admission: RE | Admit: 2020-09-06 | Discharge: 2020-09-06 | Disposition: A | Discharge status: Home / Self Care | Payer: No Typology Code available for payment source | Source: Ambulatory Visit | Attending: Family Medicine | Admitting: Family Medicine

## 2020-09-06 ENCOUNTER — Other Ambulatory Visit: Payer: Self-pay

## 2020-09-06 DIAGNOSIS — Z1231 Encounter for screening mammogram for malignant neoplasm of breast: Secondary | ICD-10-CM | POA: Insufficient documentation

## 2020-09-12 ENCOUNTER — Other Ambulatory Visit: Payer: Self-pay

## 2020-09-12 ENCOUNTER — Other Ambulatory Visit
Admission: RE | Admit: 2020-09-12 | Discharge: 2020-09-12 | Disposition: A | Discharge status: Home / Self Care | Payer: No Typology Code available for payment source | Source: Ambulatory Visit | Attending: Gastroenterology | Admitting: Gastroenterology

## 2020-09-12 DIAGNOSIS — Z01812 Encounter for preprocedural laboratory examination: Secondary | ICD-10-CM | POA: Diagnosis present

## 2020-09-12 DIAGNOSIS — Z20822 Contact with and (suspected) exposure to covid-19: Secondary | ICD-10-CM | POA: Diagnosis not present

## 2020-09-12 LAB — SARS CORONAVIRUS 2 (TAT 6-24 HRS): SARS Coronavirus 2: NEGATIVE

## 2020-09-13 ENCOUNTER — Encounter: Payer: Self-pay | Admitting: Gastroenterology

## 2020-09-14 ENCOUNTER — Ambulatory Visit
Admission: RE | Admit: 2020-09-14 | Discharge: 2020-09-14 | Disposition: A | Discharge status: Home / Self Care | Payer: PRIVATE HEALTH INSURANCE | Attending: Gastroenterology | Admitting: Gastroenterology

## 2020-09-14 ENCOUNTER — Encounter
Admission: RE | Disposition: A | Discharge status: Home / Self Care | Payer: Self-pay | Source: Home / Self Care | Attending: Gastroenterology

## 2020-09-14 ENCOUNTER — Ambulatory Visit: Payer: PRIVATE HEALTH INSURANCE | Admitting: Certified Registered Nurse Anesthetist

## 2020-09-14 ENCOUNTER — Encounter: Payer: Self-pay | Admitting: Gastroenterology

## 2020-09-14 DIAGNOSIS — Z7984 Long term (current) use of oral hypoglycemic drugs: Secondary | ICD-10-CM | POA: Diagnosis not present

## 2020-09-14 DIAGNOSIS — Z79899 Other long term (current) drug therapy: Secondary | ICD-10-CM | POA: Insufficient documentation

## 2020-09-14 DIAGNOSIS — Z1211 Encounter for screening for malignant neoplasm of colon: Secondary | ICD-10-CM | POA: Insufficient documentation

## 2020-09-14 DIAGNOSIS — D122 Benign neoplasm of ascending colon: Secondary | ICD-10-CM | POA: Insufficient documentation

## 2020-09-14 DIAGNOSIS — Z7982 Long term (current) use of aspirin: Secondary | ICD-10-CM | POA: Diagnosis not present

## 2020-09-14 DIAGNOSIS — K635 Polyp of colon: Secondary | ICD-10-CM | POA: Diagnosis not present

## 2020-09-14 HISTORY — PX: COLONOSCOPY WITH PROPOFOL: SHX5780

## 2020-09-14 LAB — GLUCOSE, CAPILLARY: Glucose-Capillary: 163 mg/dL — ABNORMAL HIGH (ref 70–99)

## 2020-09-14 SURGERY — COLONOSCOPY WITH PROPOFOL
Anesthesia: General

## 2020-09-14 MED ORDER — SODIUM CHLORIDE 0.9 % IV SOLN
INTRAVENOUS | Status: DC
  Administered 2020-09-14: 20 mL/h via INTRAVENOUS

## 2020-09-14 MED ORDER — PROPOFOL 500 MG/50ML IV EMUL
INTRAVENOUS | Status: DC | PRN
  Administered 2020-09-14: 150 ug/kg/min via INTRAVENOUS

## 2020-09-14 MED ORDER — PROPOFOL 10 MG/ML IV BOLUS
INTRAVENOUS | Status: DC | PRN
  Administered 2020-09-14 (×2): 20 mg via INTRAVENOUS
  Administered 2020-09-14: 60 mg via INTRAVENOUS

## 2020-09-14 MED ORDER — LIDOCAINE HCL (CARDIAC) PF 100 MG/5ML IV SOSY
PREFILLED_SYRINGE | INTRAVENOUS | Status: DC | PRN
  Administered 2020-09-14: 50 mg via INTRAVENOUS

## 2020-09-14 NOTE — H&P (Signed)
Andrea Bellows, MD [address] [place], [place], [place], [address] [address], [place], [place], [place], [address] Phone: [phone]  Fax: [phone]  Primary Care Physician:  Olin Hauser, DO   Pre-Procedure History & Physical: HPI:  Andrea Mcdonald is a 59 y.o. female is here for an colonoscopy.   Past Medical History:  Diagnosis Date  . Diabetes mellitus without complication ([place])   . High cholesterol   . Plantar fasciitis, bilateral 05/25/2013    Past Surgical History:  Procedure Laterality Date  . AUGMENTATION MAMMAPLASTY    . ESOPHAGOGASTRIC FUNDOPLICATION  2952   at [facility] for GERD  . KIDNEY STONE SURGERY    . LITHOTRIPSY    . NECK SURGERY     x2  . PARTIAL HYSTERECTOMY    . PLACEMENT OF BREAST IMPLANTS      Prior to Admission medications   Medication Sig Start Date End Date Taking? Authorizing Provider  aspirin 81 MG chewable tablet Chew 81 mg by mouth at bedtime.    Yes [provider]  Dulaglutide (TRULICITY) 1.5 WU/1.3KG SOPN Inject 1.5 mg into the skin once a week. 07/23/20  Yes Karamalegos, Devonne Doughty, DO  EQ LORATADINE 10 MG tablet Take 1 tablet by mouth once daily 04/04/20  Yes Karamalegos, Alexander J, DO  lisinopril (ZESTRIL) 5 MG tablet Take 1 tablet (5 mg total) by mouth daily. 07/23/20  Yes Karamalegos, Devonne Doughty, DO  metFORMIN (GLUCOPHAGE-XR) 500 MG 24 hr tablet Take 1 tablet (500 mg total) by mouth 2 (two) times daily with a meal. 07/23/20  Yes Karamalegos, Devonne Doughty, DO  PAZEO 0.7 % SOLN  03/31/17  Yes [provider]  rosuvastatin (CRESTOR) 20 MG tablet Take 1 tablet (20 mg total) by mouth at bedtime. 07/23/20  Yes Karamalegos, Devonne Doughty, DO  sertraline (ZOLOFT) 100 MG tablet Take 1.5 tablets (150 mg total) by mouth at bedtime. 07/23/20  Yes Karamalegos, Devonne Doughty, DO  fluticasone (FLONASE) 50 MCG/ACT nasal spray Use 2 spray(s) in each nostril once daily 02/21/19   Olin Hauser, DO     Allergies as of 08/15/2020 - Review Complete 08/15/2020  Allergen Reaction Noted  . Codeine Itching 07/16/2013    Family History  Problem Relation Age of Onset  . Heart disease Mother   . Depression Mother   . Anxiety disorder Mother   . Cancer Mother        retinal  . Breast cancer Neg Hx     Social History   Socioeconomic History  . Marital status: Married    Spouse name: Not on file  . Number of children: Not on file  . Years of education: Not on file  . Highest education level: Not on file  Occupational History  . Not on file  Tobacco Use  . Smoking status: Never Smoker  . Smokeless tobacco: Never Used  Vaping Use  . Vaping Use: Never used  Substance and Sexual Activity  . Alcohol use: Yes    Comment: drink occ  . Drug use: No  . Sexual activity: Yes  Other Topics Concern  . Not on file  Social History Narrative  . Not on file   Social Determinants of Health   Financial Resource Strain:   . Difficulty of Paying Living Expenses: Not on file  Food Insecurity:   . Worried About Charity fundraiser in the Last Year: Not on file  . Ran Out of Food in the Last  Year: Not on file  Transportation Needs:   . Lack of Transportation (Medical): Not on file  . Lack of Transportation (Non-Medical): Not on file  Physical Activity:   . Days of Exercise per Week: Not on file  . Minutes of Exercise per Session: Not on file  Stress:   . Feeling of Stress : Not on file  Social Connections:   . Frequency of Communication with Friends and Family: Not on file  . Frequency of Social Gatherings with Friends and Family: Not on file  . Attends Religious Services: Not on file  . Active Member of Clubs or Organizations: Not on file  . Attends Archivist Meetings: Not on file  . Marital Status: Not on file  Intimate Partner Violence:   . Fear of Current or Ex-Partner: Not on file  . Emotionally Abused: Not on file  . Physically Abused: Not on file  . Sexually  Abused: Not on file    Review of Systems: See HPI, otherwise negative ROS  Physical Exam: BP 119/73   Pulse 70   Temp (!) 96 F (35.6 C) (Temporal)   Resp 20   Ht 5\' 5"  (1.651 m)   Wt 79.4 kg   SpO2 98%   BMI 29.12 kg/m  General:   Alert,  pleasant and cooperative in NAD Head:  Normocephalic and atraumatic. Neck:  Supple; no masses or thyromegaly. Lungs:  Clear throughout to auscultation, normal respiratory effort.    Heart:  +S1, +S2, Regular rate and rhythm, No edema. Abdomen:  Soft, nontender and nondistended. Normal bowel sounds, without guarding, and without rebound.   Neurologic:  Alert and  oriented x4;  grossly normal neurologically.  Impression/Plan: Andrea Mcdonald is here for an colonoscopy to be performed for Screening colonoscopy average risk   Risks, benefits, limitations, and alternatives regarding  colonoscopy have been reviewed with the patient.  Questions have been answered.  All parties agreeable.   Andrea Bellows, MD  09/14/2020, 9:33 AM

## 2020-09-14 NOTE — Anesthesia Preprocedure Evaluation (Signed)
Anesthesia Evaluation  Patient identified by MRN, date of birth, ID band Patient awake    Reviewed: Allergy & Precautions, H&P , NPO status , Patient's Chart, lab work & pertinent test results, reviewed documented beta blocker date and time   History of Anesthesia Complications Negative for: history of anesthetic complications  Airway Mallampati: II  TM Distance: >3 FB Neck ROM: full    Dental  (+) Dental Advidsory Given, Caps, Teeth Intact   Pulmonary neg pulmonary ROS,    Pulmonary exam normal breath sounds clear to auscultation       Cardiovascular Exercise Tolerance: Good negative cardio ROS Normal cardiovascular exam Rhythm:regular Rate:Normal     Neuro/Psych PSYCHIATRIC DISORDERS Anxiety Depression negative neurological ROS     GI/Hepatic negative GI ROS, Neg liver ROS,   Endo/Other  diabetes  Renal/GU negative Renal ROS  negative genitourinary   Musculoskeletal   Abdominal   Peds  Hematology negative hematology ROS (+)   Anesthesia Other Findings Past Medical History: No date: Diabetes mellitus without complication (H[facility]) No date: High cholesterol 05/25/2013: Plantar fasciitis, bilateral   Reproductive/Obstetrics negative OB ROS                             Anesthesia Physical Anesthesia Plan  ASA: II  Anesthesia Plan: General   Post-op Pain Management:    Induction: Intravenous  PONV Risk Score and Plan: 3 and Propofol infusion and TIVA  Airway Management Planned: Natural Airway and Nasal Cannula  Additional Equipment:   Intra-op Plan:   Post-operative Plan:   Informed Consent: I have reviewed the patients History and Physical, chart, labs and discussed the procedure including the risks, benefits and alternatives for the proposed anesthesia with the patient or authorized representative who has indicated his/her understanding and acceptance.     Dental Advisory  Given  Plan Discussed with: Anesthesiologist, CRNA and Surgeon  Anesthesia Plan Comments:         Anesthesia Quick Evaluation

## 2020-09-14 NOTE — Transfer of Care (Signed)
Immediate Anesthesia Transfer of Care Note  Patient: Andrea Mcdonald  Procedure(s) Performed: COLONOSCOPY WITH PROPOFOL (N/A )  Patient Location: Endoscopy Unit  Anesthesia Type:General  Level of Consciousness: drowsy  Airway & Oxygen Therapy: Patient Spontanous Breathing  Post-op Assessment: Report given to RN and Post -op Vital signs reviewed and stable  Post vital signs: Reviewed and stable  Last Vitals:  Vitals Value Taken Time  BP    Temp    Pulse 85 09/14/20 1013  Resp 16 09/14/20 1013  SpO2 93 % 09/14/20 1013  Vitals shown include unvalidated device data.  Last Pain:  Vitals:   09/14/20 0921  TempSrc: Temporal  PainSc: 0-No pain         Complications: No complications documented.

## 2020-09-14 NOTE — Op Note (Signed)
[facility] Patient Name: Andrea Mcdonald Procedure Date: 09/14/2020 9:37 AM MRN: [id number] Account #: [id number] Date of Birth: [birth year] Admit Type: Outpatient Age: 59 Room: [facility] ENDO ROOM 4 Gender: Female Note Status: Finalized Procedure:             Colonoscopy Indications:           Screening for colorectal malignant neoplasm Providers:             Jonathon Bellows MD, MD Referring MD:          Olin Hauser (Referring MD) Medicines:             Monitored Anesthesia Care Complications:         No immediate complications. Procedure:             Pre-Anesthesia Assessment:                        - Prior to the procedure, a History and Physical was                         performed, and patient medications, allergies and                         sensitivities were reviewed. The patient's tolerance                         of previous anesthesia was reviewed.                        - The risks and benefits of the procedure and the                         sedation options and risks were discussed with the                         patient. All questions were answered and informed                         consent was obtained.                        - ASA Grade Assessment: II - A patient with mild                         systemic disease.                        After obtaining informed consent, the colonoscope was                         passed under direct vision. Throughout the procedure,                         the patient's blood pressure, pulse, and oxygen                         saturations were monitored continuously. The                         Colonoscope was introduced through the anus and  advanced to the the cecum, identified by the                         appendiceal orifice. The colonoscopy was performed                         with ease. The patient tolerated the procedure well.                         The  quality of the bowel preparation was excellent. Findings:      The perianal and digital rectal examinations were normal.      A 4 mm polyp was found in the proximal ascending colon. The polyp was       sessile. The polyp was removed with a cold snare. Resection and       retrieval were complete.      The exam was otherwise without abnormality on direct and retroflexion       views. Impression:            - One 4 mm polyp in the proximal ascending colon,                         removed with a cold snare. Resected and retrieved.                        - The examination was otherwise normal on direct and                         retroflexion views. Recommendation:        - Discharge patient to home (with escort).                        - Resume previous diet.                        - Continue present medications.                        - Await pathology results.                        - Repeat colonoscopy for surveillance based on                         pathology results. Procedure Code(s):     --- Professional ---                        [phone], Colonoscopy, flexible; with removal of                         tumor(s), polyp(s), or other lesion(s) by snare                         technique Diagnosis Code(s):     --- Professional ---                        Z12.11, Encounter for screening for malignant neoplasm  of colon                        K63.5, Polyp of colon CPT copyright 2019 American Medical Association. All rights reserved. The codes documented in this report are preliminary and upon coder review may  be revised to meet current compliance requirements. Jonathon Bellows, MD Jonathon Bellows MD, MD 09/14/2020 10:09:21 AM This report has been signed electronically. Number of Addenda: 0 Note Initiated On: 09/14/2020 9:37 AM Scope Withdrawal Time: 0 hours 11 minutes 56 seconds  Total Procedure Duration: 0 hours 14 minutes 56 seconds  Estimated Blood Loss:  Estimated blood  loss: none.      [facility]

## 2020-09-14 NOTE — Anesthesia Postprocedure Evaluation (Signed)
Anesthesia Post Note  Patient: Andrea Mcdonald  Procedure(s) Performed: COLONOSCOPY WITH PROPOFOL (N/A )  Patient location during evaluation: Endoscopy Anesthesia Type: General Level of consciousness: awake and alert Pain management: pain level controlled Vital Signs Assessment: post-procedure vital signs reviewed and stable Respiratory status: spontaneous breathing, nonlabored ventilation, respiratory function stable and patient connected to nasal cannula oxygen Cardiovascular status: blood pressure returned to baseline and stable Postop Assessment: no apparent nausea or vomiting Anesthetic complications: no   No complications documented.   Last Vitals:  Vitals:   09/14/20 1030 09/14/20 1040  BP: 105/69 108/63  Pulse: 73 64  Resp: 14 15  Temp:    SpO2: 100% 99%    Last Pain:  Vitals:   09/14/20 1010  TempSrc: Temporal  PainSc:                  Martha Clan

## 2020-09-17 ENCOUNTER — Encounter: Payer: Self-pay | Admitting: Gastroenterology

## 2020-09-17 LAB — SURGICAL PATHOLOGY

## 2020-11-14 ENCOUNTER — Telehealth (INDEPENDENT_AMBULATORY_CARE_PROVIDER_SITE_OTHER): Payer: No Typology Code available for payment source | Admitting: Family Medicine

## 2020-11-14 ENCOUNTER — Other Ambulatory Visit: Payer: Self-pay

## 2020-11-14 ENCOUNTER — Encounter: Payer: Self-pay | Admitting: Family Medicine

## 2020-11-14 VITALS — Ht 65.0 in | Wt 175.0 lb

## 2020-11-14 DIAGNOSIS — U071 COVID-19: Secondary | ICD-10-CM

## 2020-11-14 DIAGNOSIS — J9801 Acute bronchospasm: Secondary | ICD-10-CM | POA: Diagnosis not present

## 2020-11-14 MED ORDER — PREDNISONE 10 MG PO TABS: ORAL_TABLET | ORAL | 0 refills | Status: DC

## 2020-11-14 MED ORDER — BENZONATATE 100 MG PO CAPS: 100.0000 mg | ORAL_CAPSULE | Freq: Three times a day (TID) | ORAL | 0 refills | Status: DC | PRN

## 2020-11-14 MED ORDER — ALBUTEROL SULFATE HFA 108 (90 BASE) MCG/ACT IN AERS: 2.0000 | INHALATION_SPRAY | RESPIRATORY_TRACT | 0 refills | Status: DC | PRN

## 2020-11-14 MED ORDER — ONDANSETRON 4 MG PO TBDP: 4.0000 mg | ORAL_TABLET | Freq: Three times a day (TID) | ORAL | 0 refills | Status: DC | PRN

## 2020-11-14 MED ORDER — IPRATROPIUM BROMIDE 0.06 % NA SOLN: 2.0000 | Freq: Four times a day (QID) | NASAL | 0 refills | Status: DC

## 2020-11-14 NOTE — Progress Notes (Signed)
Virtual Visit via Telephone The purpose of this virtual visit is to provide medical care while limiting exposure to the novel coronavirus (COVID19) for both patient and office staff.  Consent was obtained for phone visit:  Yes.   Answered questions that patient had about telehealth interaction:  Yes.   I discussed the limitations, risks, security and privacy concerns of performing an evaluation and management service by telephone. I also discussed with the patient that there may be a patient responsible charge related to this service. The patient expressed understanding and agreed to proceed.  Patient Location: Home Provider Location: [facility] (Office)  Participants in virtual visit: - Patient: Andrea Mcdonald. Andrea Mcdonald - CMA: Orinda Kenner, CMA - Provider: Dr Parks Ranger  ---------------------------------------------------------------------- Chief Complaint  Patient presents with  . Headache  . Cough  . Nasal Congestion  . Nausea    S: Reviewed CMA documentation. I have called patient and gathered additional HPI as follows:  COVID19 infection Reports that symptoms started Saturday 11/10/20 with headache, sneezing, chills, aches. She took a rapid home COVID test on Monday 11/12/20 POSITIVE. She initially felt somewhat better but admits raw chest from coughing with some productive cough. Chest sore from coughing. Admits some nausea. - Tried OTC Cough Syrup, Honey, Alka Seltzer Plus, Flonase nasal spray  Denies any known or suspected exposure to person with or possibly with COVID19.  Denies any fevers, chills, sweats, body ache, cough, shortness of breath, sinus pain or pressure, headache, abdominal pain, diarrhea  Past Medical History:  Diagnosis Date  . Diabetes mellitus without complication ([place])   . High cholesterol   . Plantar fasciitis, bilateral 05/25/2013   Social History   Tobacco Use  . Smoking status: Never Smoker  . Smokeless tobacco: Never Used   Vaping Use  . Vaping Use: Never used  Substance Use Topics  . Alcohol use: Yes    Comment: drink occ  . Drug use: No    Current Outpatient Medications:  .  albuterol (VENTOLIN HFA) 108 (90 Base) MCG/ACT inhaler, Inhale 2 puffs into the lungs every 4 (four) hours as needed for wheezing or shortness of breath (cough)., Disp: 6.7 each, Rfl: 0 .  aspirin 81 MG chewable tablet, Chew 81 mg by mouth at bedtime. , Disp: , Rfl:  .  benzonatate (TESSALON) 100 MG capsule, Take 1 capsule (100 mg total) by mouth 3 (three) times daily as needed for cough., Disp: 30 capsule, Rfl: 0 .  Dulaglutide (TRULICITY) 1.5 VE/9.3YB SOPN, Inject 1.5 mg into the skin once a week., Disp: 6 mL, Rfl: 3 .  EQ LORATADINE 10 MG tablet, Take 1 tablet by mouth once daily, Disp: 90 tablet, Rfl: 3 .  fluticasone (FLONASE) 50 MCG/ACT nasal spray, Use 2 spray(s) in each nostril once daily, Disp: 48 g, Rfl: 1 .  ipratropium (ATROVENT) 0.06 % nasal spray, Place 2 sprays into both nostrils 4 (four) times daily. For up to 5-7 days then stop., Disp: 15 mL, Rfl: 0 .  lisinopril (ZESTRIL) 5 MG tablet, Take 1 tablet (5 mg total) by mouth daily., Disp: 90 tablet, Rfl: 3 .  metFORMIN (GLUCOPHAGE-XR) 500 MG 24 hr tablet, Take 1 tablet (500 mg total) by mouth 2 (two) times daily with a meal., Disp: 180 tablet, Rfl: 3 .  ondansetron (ZOFRAN ODT) 4 MG disintegrating tablet, Take 1 tablet (4 mg total) by mouth every 8 (eight) hours as needed for nausea or vomiting., Disp: 30 tablet, Rfl: 0 .  PAZEO 0.7 %  SOLN, , Disp: , Rfl:  .  predniSONE (DELTASONE) 10 MG tablet, Take 6 tabs with breakfast Day 1, 5 tabs Day 2, 4 tabs Day 3, 3 tabs Day 4, 2 tabs Day 5, 1 tab Day 6., Disp: 21 tablet, Rfl: 0 .  rosuvastatin (CRESTOR) 20 MG tablet, Take 1 tablet (20 mg total) by mouth at bedtime., Disp: 90 tablet, Rfl: 3 .  sertraline (ZOLOFT) 100 MG tablet, Take 1.5 tablets (150 mg total) by mouth at bedtime., Disp: 135 tablet, Rfl: 3  Depression screen [facility] 2/9  07/23/2020 07/21/2019 01/21/2019  Decreased Interest 0 0 0  Down, Depressed, Hopeless 0 0 1  PHQ - 2 Score 0 0 1  Altered sleeping 0 0 1  Tired, decreased energy 0 1 0  Change in appetite 0 1 1  Feeling bad or failure about yourself  0 0 0  Trouble concentrating 0 1 2  Moving slowly or fidgety/restless 0 0 0  Suicidal thoughts 0 0 0  PHQ-9 Score 0 3 5  Difficult doing work/chores Not difficult at all Not difficult at all Not difficult at all  Some recent data might be hidden    GAD 7 : Generalized Anxiety Score 07/23/2020 07/21/2019 01/21/2019 10/22/2018  Nervous, Anxious, on Edge 1 1 2 1   Control/stop worrying 1 1 2 2   Worry too much - different things 1 1 2 2   Trouble relaxing 0 1 3 1   Restless 0 1 2 1   Easily annoyed or irritable 0 1 1 2   Afraid - awful might happen 0 1 2 2   Total GAD 7 Score 3 7 14 11   Anxiety Difficulty Not difficult at all Not difficult at all Not difficult at all Not difficult at all    -------------------------------------------------------------------------- O: No physical exam performed due to remote telephone encounter.  Lab results reviewed.  Recent Results (from the past 2160 hour(s))  SARS CORONAVIRUS 2 (TAT 6-24 HRS) Nasopharyngeal Nasopharyngeal Swab     Status: None   Collection Time: 09/12/20  9:59 AM   Specimen: Nasopharyngeal Swab  Result Value Ref Range   SARS Coronavirus 2 NEGATIVE NEGATIVE    Comment: (NOTE) SARS-CoV-2 target nucleic acids are NOT DETECTED.  The SARS-CoV-2 RNA is generally detectable in upper and lower respiratory specimens during the acute phase of infection. Negative results do not preclude SARS-CoV-2 infection, do not rule out co-infections with other pathogens, and should not be used as the sole basis for treatment or other patient management decisions. Negative results must be combined with clinical observations, patient history, and epidemiological information. The expected result is Negative.  Fact Sheet for  Patients: SugarRoll.be  Fact Sheet for Healthcare Providers: https://www.woods-mathews.com/  This test is not yet approved or cleared by the [place] FDA and  has been authorized for detection and/or diagnosis of SARS-CoV-2 by FDA under an Emergency Use Authorization (EUA). This EUA will remain  in effect (meaning this test can be used) for the duration of the COVID-19 declaration under Se ction 564(b)(1) of the Act, 21 U.S.C. section 360bbb-3(b)(1), unless the authorization is terminated or revoked sooner.  Performed at [facility], [place] [address], [place], [place] [address]   Glucose, capillary     Status: Abnormal   Collection Time: 09/14/20  9:28 AM  Result Value Ref Range   Glucose-Capillary 163 (H) 70 - 99 mg/dL    Comment: Glucose reference range applies only to samples taken after fasting for at least 8 hours.   Comment  1 IN EPIC   Surgical pathology     Status: None   Collection Time: 09/14/20  9:57 AM  Result Value Ref Range   SURGICAL PATHOLOGY      SURGICAL PATHOLOGY CASE: [id number] PATIENT: Lamar Benes Surgical Pathology Report     Specimen Submitted: A. Colon polyp, Ascending; CS  Clinical History: Screening colonoscopy, Colon polyp      DIAGNOSIS: A. COLON POLYP, ASCENDING; COLD SNARE: - TUBULAR ADENOMA. - NEGATIVE FOR HIGH-GRADE DYSPLASIA AND MALIGNANCY.  GROSS DESCRIPTION: A. Labeled: Cold snare polyp ascending colon Received: In formalin Tissue fragment(s): 3 Size: 0.5 x 0.2 x 0.1 cm Description: Aggregate of pink-white tissue fragments Entirely submitted in 1 cassette.   Final Diagnosis performed by Quay Burow, MD.   Electronically signed 09/17/2020 11:14:34AM The electronic signature indicates that the named Attending Pathologist has evaluated the specimen Technical component performed at [facility], [address], [place], [place] [address] Lab: [phone] Dir: Rush Farmer, MD, MMM  Professional component performed at [facility], [facility], [place], [place], [place] [address] Lab: [phone] Dir: Dellia Nims. Rubinas, MD     -------------------------------------------------------------------------- A&P:  Problem List Items Addressed This Visit   None   Visit Diagnoses    COVID-19 virus infection    -  Primary   Relevant Medications   albuterol (VENTOLIN HFA) 108 (90 Base) MCG/ACT inhaler   predniSONE (DELTASONE) 10 MG tablet   ipratropium (ATROVENT) 0.06 % nasal spray   benzonatate (TESSALON) 100 MG capsule   ondansetron (ZOFRAN ODT) 4 MG disintegrating tablet   Bronchospasm, acute       Relevant Medications   albuterol (VENTOLIN HFA) 108 (90 Base) MCG/ACT inhaler   predniSONE (DELTASONE) 10 MG tablet     COVID19 Symptom onset 1/15 mild to moderate Tested positive home rapid test 1/17, then out of work  Treat with Prednisone taper, caution on hyperglycemia, rx Albuterol PRN bronchospasm Also add Atrovent Nasal, Tessalon PRN Supportive care  Work note, mailed to patient.  Follow-up if not resolved. Return criteria given.  Meds ordered this encounter  Medications  . albuterol (VENTOLIN HFA) 108 (90 Base) MCG/ACT inhaler    Sig: Inhale 2 puffs into the lungs every 4 (four) hours as needed for wheezing or shortness of breath (cough).    Dispense:  6.7 each    Refill:  0  . predniSONE (DELTASONE) 10 MG tablet    Sig: Take 6 tabs with breakfast Day 1, 5 tabs Day 2, 4 tabs Day 3, 3 tabs Day 4, 2 tabs Day 5, 1 tab Day 6.    Dispense:  21 tablet    Refill:  0  . ipratropium (ATROVENT) 0.06 % nasal spray    Sig: Place 2 sprays into both nostrils 4 (four) times daily. For up to 5-7 days then stop.    Dispense:  15 mL    Refill:  0  . benzonatate (TESSALON) 100 MG capsule    Sig: Take 1 capsule (100 mg total) by mouth 3 (three) times daily as needed for cough.    Dispense:  30 capsule    Refill:  0  .  ondansetron (ZOFRAN ODT) 4 MG disintegrating tablet    Sig: Take 1 tablet (4 mg total) by mouth every 8 (eight) hours as needed for nausea or vomiting.    Dispense:  30 tablet    Refill:  0    Follow-up: - Return in 1-2 weeks as needed COVID  Patient verbalizes understanding with  the above medical recommendations including the limitation of remote medical advice.  Specific follow-up and call-back criteria were given for patient to follow-up or seek medical care more urgently if needed.   - Time spent in direct consultation with patient on phone: 15 minutes   Nobie Putnam, [facility] 11/14/2020, 11:46 AM

## 2020-11-14 NOTE — Patient Instructions (Addendum)
   Please schedule a Follow-up Appointment to: Return if symptoms worsen or fail to improve.  If you have any other questions or concerns, please feel free to call the office or send a message through MyChart. You may also schedule an earlier appointment if necessary.  Additionally, you may be receiving a survey about your experience at our office within a few days to 1 week by e-mail or mail. We value your feedback.  Aleaha Fickling, DO [facility], [facility] 

## 2020-11-15 ENCOUNTER — Telehealth: Payer: Self-pay

## 2020-11-15 ENCOUNTER — Telehealth: Payer: Self-pay | Admitting: Family Medicine

## 2020-11-15 NOTE — Telephone Encounter (Signed)
Called patient. Resent work note, it was already sent yesterday, unsure why it did not send to her mychart correctly  Andrea Mcdonald, [facility] 11/15/2020, 11:54 AM

## 2020-11-15 NOTE — Telephone Encounter (Signed)
Copied from [place] [id number]. Topic: General - Inquiry >> Nov 15, 2020  9:37 AM Gillis Ends D wrote: Reason for CRM: Patient would like a work note sent to her through [facility] for her appointment on yesterday. She needs it for work. She can be reached at [phone]. Please advise

## 2020-12-19 ENCOUNTER — Telehealth: Payer: No Typology Code available for payment source | Admitting: Family Medicine

## 2021-03-27 HISTORY — PX: OTHER SURGICAL HISTORY: SHX169

## 2021-07-25 ENCOUNTER — Encounter: Payer: No Typology Code available for payment source | Admitting: Family Medicine

## 2021-08-01 ENCOUNTER — Encounter: Payer: No Typology Code available for payment source | Admitting: Family Medicine

## 2021-08-06 ENCOUNTER — Ambulatory Visit (INDEPENDENT_AMBULATORY_CARE_PROVIDER_SITE_OTHER): Payer: No Typology Code available for payment source | Admitting: Family Medicine

## 2021-08-06 ENCOUNTER — Encounter: Payer: Self-pay | Admitting: Family Medicine

## 2021-08-06 ENCOUNTER — Other Ambulatory Visit: Payer: Self-pay

## 2021-08-06 ENCOUNTER — Other Ambulatory Visit: Payer: Self-pay | Admitting: Family Medicine

## 2021-08-06 VITALS — BP 99/61 | HR 73 | Ht 65.0 in | Wt 172.2 lb

## 2021-08-06 DIAGNOSIS — Z0000 Encounter for general adult medical examination without abnormal findings: Secondary | ICD-10-CM

## 2021-08-06 DIAGNOSIS — I10 Essential (primary) hypertension: Secondary | ICD-10-CM

## 2021-08-06 DIAGNOSIS — E1169 Type 2 diabetes mellitus with other specified complication: Secondary | ICD-10-CM

## 2021-08-06 DIAGNOSIS — I8391 Asymptomatic varicose veins of right lower extremity: Secondary | ICD-10-CM

## 2021-08-06 DIAGNOSIS — Z23 Encounter for immunization: Secondary | ICD-10-CM | POA: Diagnosis not present

## 2021-08-06 NOTE — Patient Instructions (Addendum)
Thank you for coming to the office today.  Referral sent to [facility]  DUE for FASTING BLOOD WORK (no food or drink after midnight before the lab appointment, only water or coffee without cream/sugar on the morning of)  SCHEDULE "Lab Only" visit in the morning at the clinic for lab draw in 4 WEEKS   - Make sure Lab Only appointment is at about 1 week before your next appointment, so that results will be available  For Lab Results, once available within 2-3 days of blood draw, you can can log in to MyChart online to view your results and a brief explanation. Also, we can discuss results at next follow-up visit.   Please schedule a Follow-up Appointment to: Return in about 4 weeks (around 09/03/2021) for 4 weeks fasting lab only then 1 week later Annual Physical.  If you have any other questions or concerns, please feel free to call the office or send a message through [place]. You may also schedule an earlier appointment if necessary.  Additionally, you may be receiving a survey about your experience at our office within a few days to 1 week by e-mail or mail. We value your feedback.  Nobie Putnam, DO [place]

## 2021-08-06 NOTE — Progress Notes (Signed)
Subjective:    Patient ID: HISAE Mcdonald, female    DOB: 05/07/1961, 60 y.o.   MRN: [id number]  Andrea Mcdonald is a 60 y.o. female presenting on 08/06/2021 for Varicose Veins   HPI  Right Varicose Vein Swelling. Followed by [facility] for a while, last visit 03/2018 She received injections in legs for spider veins and also has had procedure before possible embolization. She needs referral to return to Vascular since it has been 3 years. Denies pain with veins or ulceration. Admits swelling, wearing compression   Depression screen [facility] 2/9 07/23/2020 07/21/2019 01/21/2019  Decreased Interest 0 0 0  Down, Depressed, Hopeless 0 0 1  PHQ - 2 Score 0 0 1  Altered sleeping 0 0 1  Tired, decreased energy 0 1 0  Change in appetite 0 1 1  Feeling bad or failure about yourself  0 0 0  Trouble concentrating 0 1 2  Moving slowly or fidgety/restless 0 0 0  Suicidal thoughts 0 0 0  PHQ-9 Score 0 3 5  Difficult doing work/chores Not difficult at all Not difficult at all Not difficult at all  Some recent data might be hidden    Social History   Tobacco Use   Smoking status: Never   Smokeless tobacco: Never  Vaping Use   Vaping Use: Never used  Substance Use Topics   Alcohol use: Yes    Comment: drink occ   Drug use: No    Review of Systems Per HPI unless specifically indicated above     Objective:    BP 99/61   Pulse 73   Ht 5\' 5"  (1.651 m)   Wt 172 lb 3.2 oz (78.1 kg)   SpO2 98%   BMI 28.66 kg/m   Wt Readings from Last 3 Encounters:  08/06/21 172 lb 3.2 oz (78.1 kg)  11/14/20 175 lb (79.4 kg)  09/14/20 175 lb (79.4 kg)    Physical Exam Vitals and nursing note reviewed.  Constitutional:      General: She is not in acute distress.    Appearance: Normal appearance. She is well-developed. She is not diaphoretic.     Comments: Well-appearing, comfortable, cooperative  HENT:     Head: Normocephalic and atraumatic.  Eyes:     General:         Right eye: No discharge.        Left eye: No discharge.     Conjunctiva/sclera: Conjunctivae normal.  Cardiovascular:     Rate and Rhythm: Normal rate.  Pulmonary:     Effort: Pulmonary effort is normal.  Musculoskeletal:     Right lower leg: Edema (trace, large varicose vein posterior calf and thigh) present.     Left lower leg: Edema (trace) present.  Skin:    General: Skin is warm and dry.     Findings: No erythema or rash.  Neurological:     Mental Status: She is alert and oriented to person, place, and time.  Psychiatric:        Mood and Affect: Mood normal.        Behavior: Behavior normal.        Thought Content: Thought content normal.     Comments: Well groomed, good eye contact, normal speech and thoughts   Results for orders placed or performed during the hospital encounter of 09/14/20  Glucose, capillary  Result Value Ref Range   Glucose-Capillary 163 (H) 70 - 99 mg/dL   Comment 1 IN  EPIC   Surgical pathology  Result Value Ref Range   SURGICAL PATHOLOGY      SURGICAL PATHOLOGY CASE: [id number] PATIENT: Lamar Benes Surgical Pathology Report     Specimen Submitted: A. Colon polyp, Ascending; CS  Clinical History: Screening colonoscopy, Colon polyp      DIAGNOSIS: A. COLON POLYP, ASCENDING; COLD SNARE: - TUBULAR ADENOMA. - NEGATIVE FOR HIGH-GRADE DYSPLASIA AND MALIGNANCY.  GROSS DESCRIPTION: A. Labeled: Cold snare polyp ascending colon Received: In formalin Tissue fragment(s): 3 Size: 0.5 x 0.2 x 0.1 cm Description: Aggregate of pink-white tissue fragments Entirely submitted in 1 cassette.   Final Diagnosis performed by Quay Burow, MD.   Electronically signed 09/17/2020 11:14:34AM The electronic signature indicates that the named Attending Pathologist has evaluated the specimen Technical component performed at [facility], [address] [place], [place] [address] Lab: [phone] Dir: Rush Farmer, MD, MMM  Professional component  performed at [facility], [facility], [place], [place], [place] [address] Lab: [phone] Dir: Dellia Nims. Reuel Derby, MD       Assessment & Plan:   Problem List Items Addressed This Visit   None Visit Diagnoses     Asymptomatic varicose veins of right lower extremity    -  Primary   Relevant Orders   Ambulatory referral to Vascular Surgery   Needs flu shot       Relevant Orders   Flu Vaccine QUAD 18mo+IM (Fluarix, Fluzone & Alfiuria Quad PF) (Completed)       Referral to return to [facility] for R lower ext varicose vein and insufficiency  Orders Placed This Encounter  Procedures   Flu Vaccine QUAD 67mo+IM (Fluarix, Fluzone & Alfiuria Quad PF)   Ambulatory referral to Vascular Surgery    Referral Priority:   Routine    Referral Type:   Surgical    Referral Reason:   Specialty Services Required    Requested Specialty:   Vascular Surgery    Number of Visits Requested:   1     No orders of the defined types were placed in this encounter.     Follow up plan: Return in about 4 weeks (around 09/03/2021) for 4 weeks fasting lab only then 1 week later Annual Physical.   Nobie Putnam, DO [facility] 08/06/2021, 4:11 PM

## 2021-08-12 DIAGNOSIS — M65331 Trigger finger, right middle finger: Secondary | ICD-10-CM | POA: Insufficient documentation

## 2021-08-26 ENCOUNTER — Other Ambulatory Visit: Payer: Self-pay

## 2021-08-26 ENCOUNTER — Encounter: Payer: Self-pay | Admitting: Family Medicine

## 2021-08-26 ENCOUNTER — Ambulatory Visit (INDEPENDENT_AMBULATORY_CARE_PROVIDER_SITE_OTHER): Payer: No Typology Code available for payment source | Admitting: Family Medicine

## 2021-08-26 VITALS — BP 116/59 | HR 74 | Ht 65.0 in | Wt 169.8 lb

## 2021-08-26 DIAGNOSIS — E1169 Type 2 diabetes mellitus with other specified complication: Secondary | ICD-10-CM | POA: Diagnosis not present

## 2021-08-26 DIAGNOSIS — I10 Essential (primary) hypertension: Secondary | ICD-10-CM | POA: Diagnosis not present

## 2021-08-26 DIAGNOSIS — Z1231 Encounter for screening mammogram for malignant neoplasm of breast: Secondary | ICD-10-CM | POA: Diagnosis not present

## 2021-08-26 DIAGNOSIS — E785 Hyperlipidemia, unspecified: Secondary | ICD-10-CM

## 2021-08-26 DIAGNOSIS — Z0000 Encounter for general adult medical examination without abnormal findings: Secondary | ICD-10-CM | POA: Diagnosis not present

## 2021-08-26 DIAGNOSIS — F3342 Major depressive disorder, recurrent, in full remission: Secondary | ICD-10-CM

## 2021-08-26 DIAGNOSIS — I872 Venous insufficiency (chronic) (peripheral): Secondary | ICD-10-CM

## 2021-08-26 MED ORDER — ROSUVASTATIN CALCIUM 20 MG PO TABS: 20.0000 mg | ORAL_TABLET | Freq: Every day | ORAL | 3 refills | Status: DC

## 2021-08-26 MED ORDER — SERTRALINE HCL 100 MG PO TABS: 150.0000 mg | ORAL_TABLET | Freq: Every day | ORAL | 3 refills | Status: DC

## 2021-08-26 MED ORDER — METFORMIN HCL ER 500 MG PO TB24: 500.0000 mg | ORAL_TABLET | Freq: Two times a day (BID) | ORAL | 3 refills | Status: DC

## 2021-08-26 MED ORDER — TRULICITY 1.5 MG/0.5ML ~~LOC~~ SOAJ: 1.5000 mg | SUBCUTANEOUS | 3 refills | Status: DC

## 2021-08-26 MED ORDER — LISINOPRIL 5 MG PO TABS: 5.0000 mg | ORAL_TABLET | Freq: Every day | ORAL | 3 refills | Status: DC

## 2021-08-26 NOTE — Assessment & Plan Note (Signed)
Well controlled No complication Continue Lisinopril

## 2021-08-26 NOTE — Assessment & Plan Note (Signed)
Controlled cholesterol on statin  Last lipid panel 2021, due fasting lipid panel today  Plan: 1. Continue current meds - Rosuvastatin 20mg  daily 2. Continue ASA 81mg  for primary ASCVD risk reduction 3. Encourage improved lifestyle - low carb/cholesterol, reduce portion size, continue improving regular exercise

## 2021-08-26 NOTE — Assessment & Plan Note (Signed)
Previously controlled A1c in 7 range, near goal < 7 No hypoglycemia by symptoms Complicated by hyperglycemia, hyperlipidemia (controlled) Failed Victoza, Lisabeth Pick  Plan:  1. CONTINUE high dose Trulicity 1.5mg  weekly - re order a few missed doses recently - CONTINUE metformin XR 500mg  BID - Encourage improved lifestyle - emphasized importance low carb, low sugar diet, reduce portion size, regular exercise - DM Foot exam - Due DM eye

## 2021-08-26 NOTE — Patient Instructions (Addendum)
Thank you for coming to the office today.  For Mammogram screening for breast cancer   Call the [facility] below anytime to schedule your own appointment now that order has been placed.  [facility] [place], [place] [address] Phone: [phone]  -------------------  Request yearly diabetic eye exam, [facility].  --------------------------  Shingles vaccine (shingrix) 2 doses 2-6 months apart. 2nd dose can make you feel under the weather. Check with insurance on cost / coverage.   Please schedule a Follow-up Appointment to: Return in about 6 months (around 02/23/2022) for 6 month follow-up DM A1c.  If you have any other questions or concerns, please feel free to call the office or send a message through [place]. You may also schedule an earlier appointment if necessary.  Additionally, you may be receiving a survey about your experience at our office within a few days to 1 week by e-mail or mail. We value your feedback.  Nobie Putnam, DO [place]

## 2021-08-26 NOTE — Assessment & Plan Note (Addendum)
Followed by Vascular On compression socks

## 2021-08-26 NOTE — Progress Notes (Signed)
Subjective:    Patient ID: Andrea Mcdonald, female    DOB: August 14, 1961, 60 y.o.   MRN: [id number]  Andrea Mcdonald is a 60 y.o. female presenting on 08/26/2021 for Annual Exam   HPI  Here for Annual Physical and Fasting lab draw.   CHRONIC DM, Type 2 / Overweight BMI >28 - Interval update with continued improvement, based on her lifestyle, reduced appetite CBGs: not checking currently awaiting updated glucometer Meds: Metformin XR 500mg  BID, Trulicity 1.5mg  [place] weekly inj (missed 2 doses) - Prior meds on Tresiba and Victoza Reports good compliance now improved tolerating well, except occasional GI intolerance metformin Currently on ACEi Lifestyle: Weight down overall - Diet improved, low carb options - Exercise (gradually improving) Admits occasional loose stool on metformin Next DM eye exam [facility] - Oct 2022 Denies hypoglycemia, numbness tingling, polyuria   HYPERLIPIDEMIA: Reports no concerns. 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 Continues on Sertraline 150mg  daily, one and half of the 100mg  tab, needs re order   CHRONIC HTN: Current Meds - Lisinopril 5mg  Reports good compliance, took meds today. Tolerating well, w/o complaints.     Health Maintenance:   Due for Flu Shot, will return when ready   UTD COVID vaccine.   Colon CA Screening: Last Colonoscopy done 09/14/2020, good for 7 years.   Mammogram needs order.   Depression screen [facility] 2/9 07/23/2020 07/21/2019 01/21/2019  Decreased Interest 0 0 0  Down, Depressed, Hopeless 0 0 1  PHQ - 2 Score 0 0 1  Altered sleeping 0 0 1  Tired, decreased energy 0 1 0  Change in appetite 0 1 1  Feeling bad or failure about yourself  0 0 0  Trouble concentrating 0 1 2  Moving slowly or  fidgety/restless 0 0 0  Suicidal thoughts 0 0 0  PHQ-9 Score 0 3 5  Difficult doing work/chores Not difficult at all Not difficult at all Not difficult at all  Some recent data might be hidden    Past Medical History:  Diagnosis Date   Diabetes mellitus without complication ([place])    High cholesterol    Plantar fasciitis, bilateral 05/25/2013   Past Surgical History:  Procedure Laterality Date   AUGMENTATION MAMMAPLASTY     COLONOSCOPY WITH PROPOFOL N/A 09/14/2020   Procedure: COLONOSCOPY WITH PROPOFOL;  Surgeon: Jonathon Bellows, MD;  Location: [facility] ENDOSCOPY;  Service: Gastroenterology;  Laterality: N/A;   ESOPHAGOGASTRIC FUNDOPLICATION  7829   at [facility], for GERD   KIDNEY STONE SURGERY     LITHOTRIPSY     NECK SURGERY     x2   PARTIAL HYSTERECTOMY     PLACEMENT OF BREAST IMPLANTS     Social History   Socioeconomic History   Marital status: Married    Spouse name: Not on file   Number of children: Not on file   Years of education: Not on file   Highest education level: Not on file  Occupational History   Not on file  Tobacco Use   Smoking status: Never   Smokeless tobacco: Never  Vaping Use   Vaping Use: Never used  Substance and Sexual Activity   Alcohol use: Yes    Comment: drink occ   Drug use: No   Sexual activity: Yes  Other Topics  Concern   Not on file  Social History Narrative   Not on file   Social Determinants of Health   Financial Resource Strain: Not on file  Food Insecurity: Not on file  Transportation Needs: Not on file  Physical Activity: Not on file  Stress: Not on file  Social Connections: Not on file  Intimate Partner Violence: Not on file   Family History  Problem Relation Age of Onset   Heart disease Mother    Depression Mother    Anxiety disorder Mother    Cancer Mother        retinal   Breast cancer Neg Hx    Current Outpatient Medications on File Prior to Visit  Medication Sig   albuterol (VENTOLIN HFA) 108 (90 Base) MCG/ACT  inhaler Inhale 2 puffs into the lungs every 4 (four) hours as needed for wheezing or shortness of breath (cough).   aspirin 81 MG chewable tablet Chew 81 mg by mouth at bedtime.    EQ LORATADINE 10 MG tablet Take 1 tablet by mouth once daily   fluticasone (FLONASE) 50 MCG/ACT nasal spray Use 2 spray(s) in each nostril once daily   meloxicam (MOBIC) 15 MG tablet Take 15 mg by mouth daily.   ondansetron (ZOFRAN ODT) 4 MG disintegrating tablet Take 1 tablet (4 mg total) by mouth every 8 (eight) hours as needed for nausea or vomiting.   PAZEO 0.7 % SOLN    No current facility-administered medications on file prior to visit.    Review of Systems  Constitutional:  Negative for activity change, appetite change, chills, diaphoresis, fatigue and fever.  HENT:  Negative for congestion and hearing loss.   Eyes:  Negative for visual disturbance.  Respiratory:  Negative for cough, chest tightness, shortness of breath and wheezing.   Cardiovascular:  Negative for chest pain, palpitations and leg swelling.  Gastrointestinal:  Negative for abdominal pain, constipation, diarrhea, nausea and vomiting.  Genitourinary:  Negative for dysuria, frequency and hematuria.  Musculoskeletal:  Negative for arthralgias and neck pain.  Skin:  Negative for rash.  Neurological:  Negative for dizziness, weakness, light-headedness, numbness and headaches.  Hematological:  Negative for adenopathy.  Psychiatric/Behavioral:  Negative for behavioral problems, dysphoric mood and sleep disturbance.   Per HPI unless specifically indicated above      Objective:    BP (!) 116/59   Pulse 74   Ht 5\' 5"  (1.651 m)   Wt 169 lb 12.8 oz (77 kg)   SpO2 99%   BMI 28.26 kg/m   Wt Readings from Last 3 Encounters:  08/26/21 169 lb 12.8 oz (77 kg)  08/06/21 172 lb 3.2 oz (78.1 kg)  11/14/20 175 lb (79.4 kg)    Physical Exam Vitals and nursing note reviewed.  Constitutional:      General: She is not in acute distress.     Appearance: She is well-developed. She is not diaphoretic.     Comments: Well-appearing, comfortable, cooperative  HENT:     Head: Normocephalic and atraumatic.  Eyes:     General:        Right eye: No discharge.        Left eye: No discharge.     Conjunctiva/sclera: Conjunctivae normal.     Pupils: Pupils are equal, round, and reactive to light.  Neck:     Thyroid: No thyromegaly.  Cardiovascular:     Rate and Rhythm: Normal rate and regular rhythm.     Pulses: Normal pulses.  Heart sounds: Normal heart sounds. No murmur heard. Pulmonary:     Effort: Pulmonary effort is normal. No respiratory distress.     Breath sounds: Normal breath sounds. No wheezing or rales.  Abdominal:     General: Bowel sounds are normal. There is no distension.     Palpations: Abdomen is soft. There is no mass.     Tenderness: There is no abdominal tenderness.  Musculoskeletal:        General: No tenderness. Normal range of motion.     Cervical back: Normal range of motion and neck supple.     Comments: Upper / Lower Extremities: - Normal muscle tone, strength bilateral upper extremities 5/5, lower extremities 5/5  Lymphadenopathy:     Cervical: No cervical adenopathy.  Skin:    General: Skin is warm and dry.     Findings: No erythema or rash.  Neurological:     Mental Status: She is alert and oriented to person, place, and time.     Comments: Distal sensation intact to light touch all extremities  Psychiatric:        Mood and Affect: Mood normal.        Behavior: Behavior normal.        Thought Content: Thought content normal.     Comments: Well groomed, good eye contact, normal speech and thoughts    Diabetic Foot Exam - Simple   Simple Foot Form Diabetic Foot exam was performed with the following findings: Yes 08/26/2021  3:19 PM  Visual Inspection No deformities, no ulcerations, no other skin breakdown bilaterally: Yes Sensation Testing Intact to touch and monofilament testing  bilaterally: Yes Pulse Check Posterior Tibialis and Dorsalis pulse intact bilaterally: Yes Comments      Results for orders placed or performed during the hospital encounter of 09/14/20  Glucose, capillary  Result Value Ref Range   Glucose-Capillary 163 (H) 70 - 99 mg/dL   Comment 1 IN EPIC   Surgical pathology  Result Value Ref Range   SURGICAL PATHOLOGY      SURGICAL PATHOLOGY CASE: [id number] PATIENT: Lamar Benes Surgical Pathology Report     Specimen Submitted: A. Colon polyp, Ascending; CS  Clinical History: Screening colonoscopy, Colon polyp      DIAGNOSIS: A. COLON POLYP, ASCENDING; COLD SNARE: - TUBULAR ADENOMA. - NEGATIVE FOR HIGH-GRADE DYSPLASIA AND MALIGNANCY.  GROSS DESCRIPTION: A. Labeled: Cold snare polyp ascending colon Received: In formalin Tissue fragment(s): 3 Size: 0.5 x 0.2 x 0.1 cm Description: Aggregate of pink-white tissue fragments Entirely submitted in 1 cassette.   Final Diagnosis performed by Quay Burow, MD.   Electronically signed 09/17/2020 11:14:34AM The electronic signature indicates that the named Attending Pathologist has evaluated the specimen Technical component performed at [facility], [address] [place], [place] [address] Lab: [phone] Dir: Rush Farmer, MD, MMM  Professional component performed at [facility], [facility], [place], [place], [place] [address] Lab: [phone] Dir: Dellia Nims. Rubinas, MD       Assessment & Plan:   Problem List Items Addressed This Visit     Type 2 diabetes mellitus with other specified complication ([place])    Previously controlled A1c in 7 range, near goal < 7 No hypoglycemia by symptoms Complicated by hyperglycemia, hyperlipidemia (controlled) Failed Victoza, Lisabeth Pick  Plan:  1. CONTINUE high dose Trulicity 1.5mg  weekly - re order a few missed doses recently - CONTINUE metformin XR 500mg  BID - Encourage improved lifestyle -  emphasized importance low carb, low sugar diet,  reduce portion size, regular exercise - DM Foot exam - Due DM eye      Relevant Medications   TRULICITY 1.5 ZO/1.0RU SOPN   lisinopril (ZESTRIL) 5 MG tablet   metFORMIN (GLUCOPHAGE-XR) 500 MG 24 hr tablet   rosuvastatin (CRESTOR) 20 MG tablet   Major depression, recurrent, full remission (H[facility])    Resolved Major depression recurrent full remission On SSRI      Relevant Medications   sertraline (ZOLOFT) 100 MG tablet   Hyperlipidemia associated with type 2 diabetes mellitus ([place])    Controlled cholesterol on statin  Last lipid panel 2021, due fasting lipid panel today  Plan: 1. Continue current meds - Rosuvastatin 20mg  daily 2. Continue ASA 81mg  for primary ASCVD risk reduction 3. Encourage improved lifestyle - low carb/cholesterol, reduce portion size, continue improving regular exercise      Relevant Medications   TRULICITY 1.5 EA/5.4UJ SOPN   lisinopril (ZESTRIL) 5 MG tablet   metFORMIN (GLUCOPHAGE-XR) 500 MG 24 hr tablet   rosuvastatin (CRESTOR) 20 MG tablet   Essential hypertension    Well controlled No complication Continue Lisinopril      Relevant Medications   lisinopril (ZESTRIL) 5 MG tablet   rosuvastatin (CRESTOR) 20 MG tablet   Chronic venous insufficiency    Followed by Vascular On compression socks      Relevant Medications   lisinopril (ZESTRIL) 5 MG tablet   rosuvastatin (CRESTOR) 20 MG tablet   Other Visit Diagnoses     Annual physical exam    -  Primary   Encounter for screening mammogram for malignant neoplasm of breast       Relevant Orders   MM 3D SCREEN BREAST W/IMPLANT BILATERAL       Updated Health Maintenance information Ordered Mammogram, she will call to schedule [facility] Reviewed recent lab results with patient Encouraged improvement to lifestyle with diet and exercise Goal of weight loss   Meds ordered this encounter  Medications   TRULICITY 1.5 WJ/1.9JY SOPN    Sig:  Inject 1.5 mg into the skin once a week.    Dispense:  6 mL    Refill:  3    90 day   lisinopril (ZESTRIL) 5 MG tablet    Sig: Take 1 tablet (5 mg total) by mouth daily.    Dispense:  90 tablet    Refill:  3   metFORMIN (GLUCOPHAGE-XR) 500 MG 24 hr tablet    Sig: Take 1 tablet (500 mg total) by mouth 2 (two) times daily with a meal.    Dispense:  180 tablet    Refill:  3   rosuvastatin (CRESTOR) 20 MG tablet    Sig: Take 1 tablet (20 mg total) by mouth at bedtime.    Dispense:  90 tablet    Refill:  3   sertraline (ZOLOFT) 100 MG tablet    Sig: Take 1.5 tablets (150 mg total) by mouth at bedtime.    Dispense:  135 tablet    Refill:  3      Follow up plan: Return in about 6 months (around 02/23/2022) for 6 month follow-up DM A1c.  Nobie Putnam, [facility] 08/26/2021, 3:19 PM

## 2021-08-26 NOTE — Assessment & Plan Note (Signed)
Resolved Major depression recurrent full remission On SSRI

## 2021-08-27 LAB — COMPLETE METABOLIC PANEL WITH GFR
AG Ratio: 2.4 (calc) (ref 1.0–2.5)
ALT: 18 U/L (ref 6–29)
AST: 15 U/L (ref 10–35)
Albumin: 4.8 g/dL (ref 3.6–5.1)
Alkaline phosphatase (APISO): 56 U/L (ref 37–153)
BUN: 16 mg/dL (ref 7–25)
CO2: 26 mmol/L (ref 20–32)
Calcium: 9.9 mg/dL (ref 8.6–10.4)
Chloride: 106 mmol/L (ref 98–110)
Creat: 0.71 mg/dL (ref 0.50–1.05)
Globulin: 2 g/dL (calc) (ref 1.9–3.7)
Glucose, Bld: 111 mg/dL (ref 65–139)
Potassium: 4.4 mmol/L (ref 3.5–5.3)
Sodium: 139 mmol/L (ref 135–146)
Total Bilirubin: 1 mg/dL (ref 0.2–1.2)
Total Protein: 6.8 g/dL (ref 6.1–8.1)
eGFR: 97 mL/min/{1.73_m2} (ref >=60)

## 2021-08-27 LAB — CBC WITH DIFFERENTIAL/PLATELET
Absolute Monocytes: 382 cells/uL (ref 200–950)
Basophils Absolute: 20 cells/uL (ref 0–200)
Basophils Relative: 0.3 %
Eosinophils Absolute: 27 cells/uL (ref 15–500)
Eosinophils Relative: 0.4 %
HCT: 36.2 % (ref 35.0–45.0)
Hemoglobin: 12.1 g/dL (ref 11.7–15.5)
Lymphs Abs: 2291 cells/uL (ref 850–3900)
MCH: 29.6 pg (ref 27.0–33.0)
MCHC: 33.4 g/dL (ref 32.0–36.0)
MCV: 88.5 fL (ref 80.0–100.0)
MPV: 10.8 fL (ref 7.5–12.5)
Monocytes Relative: 5.7 %
Neutro Abs: 3980 cells/uL (ref 1500–7800)
Neutrophils Relative %: 59.4 %
Platelets: 172 10*3/uL (ref 140–400)
RBC: 4.09 10*6/uL (ref 3.80–5.10)
RDW: 13.6 % (ref 11.0–15.0)
Total Lymphocyte: 34.2 %
WBC: 6.7 10*3/uL (ref 3.8–10.8)

## 2021-08-27 LAB — LIPID PANEL
Cholesterol: 120 mg/dL (ref <200)
HDL: 62 mg/dL (ref >=50)
LDL Cholesterol (Calc): 42 mg/dL (calc)
Non-HDL Cholesterol (Calc): 58 mg/dL (calc) (ref <130)
Total CHOL/HDL Ratio: 1.9 (calc) (ref <5.0)
Triglycerides: 76 mg/dL (ref <150)

## 2021-08-27 LAB — HEMOGLOBIN A1C
Hgb A1c MFr Bld: 7.4 % of total Hgb — ABNORMAL HIGH (ref <5.7)
Mean Plasma Glucose: 166 mg/dL
eAG (mmol/L): 9.2 mmol/L

## 2021-08-27 LAB — TSH: TSH: 2.36 mIU/L (ref 0.40–4.50)

## 2021-09-02 ENCOUNTER — Ambulatory Visit (INDEPENDENT_AMBULATORY_CARE_PROVIDER_SITE_OTHER): Payer: No Typology Code available for payment source | Admitting: Nurse Practitioner

## 2021-09-02 ENCOUNTER — Ambulatory Visit (INDEPENDENT_AMBULATORY_CARE_PROVIDER_SITE_OTHER): Payer: No Typology Code available for payment source

## 2021-09-02 ENCOUNTER — Other Ambulatory Visit: Payer: Self-pay

## 2021-09-02 ENCOUNTER — Encounter (INDEPENDENT_AMBULATORY_CARE_PROVIDER_SITE_OTHER): Payer: Self-pay | Admitting: Nurse Practitioner

## 2021-09-02 ENCOUNTER — Other Ambulatory Visit (INDEPENDENT_AMBULATORY_CARE_PROVIDER_SITE_OTHER): Payer: Self-pay | Admitting: Nurse Practitioner

## 2021-09-02 VITALS — BP 110/68 | HR 88 | Ht 66.0 in | Wt 169.0 lb

## 2021-09-02 DIAGNOSIS — E1169 Type 2 diabetes mellitus with other specified complication: Secondary | ICD-10-CM | POA: Diagnosis not present

## 2021-09-02 DIAGNOSIS — I10 Essential (primary) hypertension: Secondary | ICD-10-CM

## 2021-09-02 DIAGNOSIS — E785 Hyperlipidemia, unspecified: Secondary | ICD-10-CM

## 2021-09-02 DIAGNOSIS — I83891 Varicose veins of right lower extremities with other complications: Secondary | ICD-10-CM | POA: Diagnosis not present

## 2021-09-02 DIAGNOSIS — I83813 Varicose veins of bilateral lower extremities with pain: Secondary | ICD-10-CM | POA: Diagnosis not present

## 2021-09-08 ENCOUNTER — Encounter (INDEPENDENT_AMBULATORY_CARE_PROVIDER_SITE_OTHER): Payer: Self-pay | Admitting: Nurse Practitioner

## 2021-09-08 NOTE — Progress Notes (Signed)
Subjective:    Patient ID: ILYA ESS, female    DOB: [birth year], 59 y.o.   MRN: [id number] Chief Complaint  Patient presents with   Follow-up    U/S Follow up    Kamarri Fischetti is a 60 year old female that presents today for evaluation of her varicose veins due to pain and discomfort.  The patient has previously had an endovenous ablation done on her right lower extremity.  The patient note significant improvement in the lower extremity pain but not resolution of the symptoms. The patient notes multiple residual varicosities bilaterally which continued to hurt with dependent positions and remained tender to palpation. The patient's swelling is minimally from preoperative status. The patient continues to wear graduated compression stockings on a daily basis but these are not eliminating the pain and discomfort. The patient continues to use over-the-counter anti-inflammatory medications to treat the pain and related symptoms but this has not given the patient relief. The patient notes the pain in the lower extremities is causing problems with daily exercise, problems at work and even with household activities such as preparing meals and doing dishes.    Post laser ultrasound shows successful ablation of the right lower extremity.  There is no evidence of reflux seen.  No evidence of DVT or superficial thrombophlebitis.  There are tortuous varicosities that communicate in the posterior proximal calf that branches off the anterior accessory saphenous vein at the mid thigh region.     Review of Systems  Musculoskeletal:  Positive for myalgias.  All other systems reviewed and are negative.     Objective:   Physical Exam Vitals reviewed.  HENT:     Head: Normocephalic.  Cardiovascular:     Rate and Rhythm: Normal rate.     Pulses: Normal pulses.  Pulmonary:     Effort: Pulmonary effort is normal.  Skin:    General: Skin is warm and dry.     Comments: Torturous varicosity  right lower extremity  Neurological:     Mental Status: She is alert and oriented to person, place, and time.  Psychiatric:        Mood and Affect: Mood normal.        Behavior: Behavior normal.        Thought Content: Thought content normal.        Judgment: Judgment normal.    BP 110/68   Pulse 88   Ht 5\' 6"  (1.676 m)   Wt 169 lb (76.7 kg)   BMI 27.28 kg/m   Past Medical History:  Diagnosis Date   Diabetes mellitus without complication (HCC)    High cholesterol    Plantar fasciitis, bilateral 05/25/2013    Social History   Socioeconomic History   Marital status: Married    Spouse name: Not on file   Number of children: Not on file   Years of education: Not on file   Highest education level: Not on file  Occupational History   Not on file  Tobacco Use   Smoking status: Never   Smokeless tobacco: Never  Vaping Use   Vaping Use: Never used  Substance and Sexual Activity   Alcohol use: Yes    Comment: drink occ   Drug use: No   Sexual activity: Yes  Other Topics Concern   Not on file  Social History Narrative   Not on file   Social Determinants of Health   Financial Resource Strain: Not on file  Food Insecurity: Not on file  Transportation  Needs: Not on file  Physical Activity: Not on file  Stress: Not on file  Social Connections: Not on file  Intimate Partner Violence: Not on file    Past Surgical History:  Procedure Laterality Date   AUGMENTATION MAMMAPLASTY     COLONOSCOPY WITH PROPOFOL N/A 09/14/2020   Procedure: COLONOSCOPY WITH PROPOFOL;  Surgeon: Jonathon Bellows, MD;  Location: [facility] ENDOSCOPY;  Service: Gastroenterology;  Laterality: N/A;   ESOPHAGOGASTRIC FUNDOPLICATION  2542   at [facility], for GERD   KIDNEY STONE SURGERY     LITHOTRIPSY     NECK SURGERY     x2   PARTIAL HYSTERECTOMY     PLACEMENT OF BREAST IMPLANTS      Family History  Problem Relation Age of Onset   Heart disease Mother    Depression Mother    Anxiety disorder Mother     Cancer Mother        retinal   Breast cancer Neg Hx     Allergies  Allergen Reactions   Codeine Itching    CBC Latest Ref Rng & Units 08/26/2021 07/23/2020 07/15/2019  WBC 3.8 - 10.8 Thousand/uL 6.7 5.4 5.3  Hemoglobin 11.7 - 15.5 g/dL 12.1 12.6 12.1  Hematocrit 35.0 - 45.0 % 36.2 38.3 37.5  Platelets 140 - 400 Thousand/uL 172 174 181      CMP     Component Value Date/Time   NA 139 08/26/2021 1527   NA 144 12/11/2015 1428   NA 139 10/29/2014 1910   K 4.4 08/26/2021 1527   K 4.5 10/29/2014 1910   CL 106 08/26/2021 1527   CL 102 10/29/2014 1910   CO2 26 08/26/2021 1527   CO2 32 10/29/2014 1910   GLUCOSE 111 08/26/2021 1527   GLUCOSE 169 (H) 10/29/2014 1910   BUN 16 08/26/2021 1527   BUN 15 12/11/2015 1428   BUN 16 10/29/2014 1910   CREATININE 0.71 08/26/2021 1527   CALCIUM 9.9 08/26/2021 1527   CALCIUM 9.7 10/29/2014 1910   PROT 6.8 08/26/2021 1527   PROT 6.7 12/11/2015 1428   PROT 7.6 10/29/2014 1910   ALBUMIN 4.6 07/08/2016 1559   ALBUMIN 4.5 12/11/2015 1428   ALBUMIN 4.1 10/29/2014 1910   AST 15 08/26/2021 1527   AST 20 10/29/2014 1910   ALT 18 08/26/2021 1527   ALT 27 10/29/2014 1910   ALKPHOS 76 07/08/2016 1559   ALKPHOS 72 10/29/2014 1910   BILITOT 1.0 08/26/2021 1527   BILITOT 0.9 12/11/2015 1428   BILITOT 0.7 10/29/2014 1910   GFRNONAA 75 07/23/2020 1059   GFRAA 87 07/23/2020 1059     No results found.     Assessment & Plan:   1. Varicose veins of both lower extremities with pain Recommend:  The patient has had successful ablation of the previously incompetent saphenous venous system but still has persistent symptoms of pain and swelling that are having a negative impact on daily life and daily activities, with new development of accessory saphenous vein.  Patient should undergo injection foam sclerotherapy to treat the residual varicosities.  The risks, benefits and alternative therapies were reviewed in detail with the patient.  All  questions were answered.  The patient agrees to proceed with foam sclerotherapy at their convenience.  The patient will continue wearing the graduated compression stockings and using the over-the-counter pain medications to treat her symptoms.       2. Essential hypertension Continue antihypertensive medications as already ordered, these medications have been reviewed and there are no  changes at this time.   3. Hyperlipidemia associated with type 2 diabetes mellitus ([place]) Continue statin as ordered and reviewed, no changes at this time    Current Outpatient Medications on File Prior to Visit  Medication Sig Dispense Refill   albuterol (VENTOLIN HFA) 108 (90 Base) MCG/ACT inhaler Inhale 2 puffs into the lungs every 4 (four) hours as needed for wheezing or shortness of breath (cough). 6.7 each 0   aspirin 81 MG chewable tablet Chew 81 mg by mouth at bedtime.      cephALEXin (KEFLEX) 500 MG capsule Keflex 500 mg capsule  Take 1 capsule(s) EVERY 6 HOURS by oral route, begin 2 days prior to procedure, take day of procedure and 2 days after procedure     EQ LORATADINE 10 MG tablet Take 1 tablet by mouth once daily 90 tablet 3   fluticasone (FLONASE) 50 MCG/ACT nasal spray Use 2 spray(s) in each nostril once daily 48 g 1   lisinopril (ZESTRIL) 5 MG tablet Take 1 tablet (5 mg total) by mouth daily. 90 tablet 3   meloxicam (MOBIC) 15 MG tablet Take 15 mg by mouth daily.     metFORMIN (GLUCOPHAGE-XR) 500 MG 24 hr tablet Take 1 tablet (500 mg total) by mouth 2 (two) times daily with a meal. 180 tablet 3   ondansetron (ZOFRAN ODT) 4 MG disintegrating tablet Take 1 tablet (4 mg total) by mouth every 8 (eight) hours as needed for nausea or vomiting. 30 tablet 0   PAZEO 0.7 % SOLN      rosuvastatin (CRESTOR) 20 MG tablet Take 1 tablet (20 mg total) by mouth at bedtime. 90 tablet 3   sertraline (ZOLOFT) 100 MG tablet Take 1.5 tablets (150 mg total) by mouth at bedtime. 403 tablet 3   TRULICITY 1.5  JQ/9.6KR SOPN Inject 1.5 mg into the skin once a week. 6 mL 3   No current facility-administered medications on file prior to visit.    There are no Patient Instructions on file for this visit. No follow-ups on file.   Kris Hartmann, NP

## 2021-09-10 ENCOUNTER — Ambulatory Visit (INDEPENDENT_AMBULATORY_CARE_PROVIDER_SITE_OTHER): Payer: No Typology Code available for payment source | Admitting: Family Medicine

## 2021-09-10 ENCOUNTER — Ambulatory Visit: Payer: Self-pay | Admitting: *Deleted

## 2021-09-10 ENCOUNTER — Ambulatory Visit: Payer: Self-pay

## 2021-09-10 DIAGNOSIS — Z91199 Patient's noncompliance with other medical treatment and regimen due to unspecified reason: Secondary | ICD-10-CM

## 2021-09-10 DIAGNOSIS — R319 Hematuria, unspecified: Secondary | ICD-10-CM

## 2021-09-10 NOTE — Telephone Encounter (Signed)
[facility] agent had pt. On hold for transfer to Triage. Call dropped. Called pt. Back and left message to call back.

## 2021-09-10 NOTE — Telephone Encounter (Signed)
Patient is calling to report she has blood in her urine. Patient states it stated very early this morning- bright red- and is now rust colored. Patient is having pressure and frequency. Patient is at work now- but would like to come in after 3pm today if possible. Patient advised no appointment- tried to contact office- but no answer. Will send message to office for review- but patient is aware she may be advised UC. Patient states a message may be left on her voice mail.

## 2021-09-10 NOTE — Telephone Encounter (Signed)
Reason for Disposition  Blood in urine  (Exception: could be normal menstrual bleeding)  Answer Assessment - Initial Assessment Questions 1. COLOR of URINE: "Describe the color of the urine."  (e.g., tea-colored, pink, red, blood clots, bloody)     Rust colored urine- was bright red this morning 2. ONSET: "When did the bleeding start?"      This morning early 3. EPISODES: "How many times has there been blood in the urine?" or "How many times today?"     3 times 4. PAIN with URINATION: "Is there any pain with passing your urine?" If Yes, ask: "How bad is the pain?"  (Scale 1-10; or mild, moderate, severe)    - MILD - complains slightly about urination hurting    - MODERATE - interferes with normal activities      - SEVERE - excruciating, unwilling or unable to urinate because of the pain      No pain- some pressure 5. FEVER: "Do you have a fever?" If Yes, ask: "What is your temperature, how was it measured, and when did it start?"     no 6. ASSOCIATED SYMPTOMS: "Are you passing urine more frequently than usual?"     Frequency-yes 7. OTHER SYMPTOMS: "Do you have any other symptoms?" (e.g., back/flank pain, abdominal pain, vomiting)     no 8. PREGNANCY: "Is there any chance you are pregnant?" "When was your last menstrual period?"     na  Protocols used: Urine - Blood In-A-AH

## 2021-09-10 NOTE — Telephone Encounter (Signed)
I called the patient to let her know that she can come after 3 to drop off a urine and that I made a telephone appt for today at 4pm. I will call her around that time to get her started. I left a voicemail letting her know of this.

## 2021-09-10 NOTE — Telephone Encounter (Signed)
If she can come by to drop off Urine Sample between 3pm to 4pm at latest then we can add her in. I would request that it would be a Virtual Apt, so we can be more flexible with when we can call her between 330pm to about 430pm, based on the full schedule. If the clinic runs later it may be closer to 5pm by time I can call her.  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 We would need the urine first though.  Nobie Putnam, [facility] 09/10/2021, 10:38 AM

## 2021-09-11 ENCOUNTER — Encounter: Payer: Self-pay | Admitting: Family Medicine

## 2021-09-11 ENCOUNTER — Ambulatory Visit (INDEPENDENT_AMBULATORY_CARE_PROVIDER_SITE_OTHER): Payer: No Typology Code available for payment source | Admitting: Family Medicine

## 2021-09-11 ENCOUNTER — Telehealth: Payer: Self-pay | Admitting: Family Medicine

## 2021-09-11 ENCOUNTER — Other Ambulatory Visit: Payer: Self-pay

## 2021-09-11 DIAGNOSIS — R310 Gross hematuria: Secondary | ICD-10-CM | POA: Diagnosis not present

## 2021-09-11 DIAGNOSIS — N3001 Acute cystitis with hematuria: Secondary | ICD-10-CM

## 2021-09-11 LAB — POCT URINALYSIS DIPSTICK
Bilirubin, UA: NEGATIVE
Glucose, UA: POSITIVE — AB
Ketones, UA: NEGATIVE
Nitrite, UA: NEGATIVE
Protein, UA: POSITIVE — AB
Spec Grav, UA: >=1.03 — AB (ref 1.010–1.025)
Urobilinogen, UA: 0.2 E.U./dL
pH, UA: 5 (ref 5.0–8.0)

## 2021-09-11 MED ORDER — CEPHALEXIN 500 MG PO CAPS: 500.0000 mg | ORAL_CAPSULE | Freq: Three times a day (TID) | ORAL | 0 refills | Status: DC

## 2021-09-11 NOTE — Patient Instructions (Addendum)
  [facility] -1st floor [place],  [place]  [address] Phone: [phone]   Please schedule a Follow-up Appointment to: Return if symptoms worsen or fail to improve.  If you have any other questions or concerns, please feel free to call the office or send a message through [place]. You may also schedule an earlier appointment if necessary.  Additionally, you may be receiving a survey about your experience at our office within a few days to 1 week by e-mail or mail. We value your feedback.  Nobie Putnam, DO [place]

## 2021-09-11 NOTE — Telephone Encounter (Signed)
Pt states she was supposed to get a call yesterday at 4 pm and her phone never rang. Pt was triaged yesterday for blood in her urine, and she says she is still urinating blood.  Pt states she has an appt at 9 am, so she had to rush off the phone.  She states she has appointments every hour (she is at work) Pt states she gets off at 3 pm, and hopes the dr can call her at 4 pm today.  Pt would .like a call back and leave on her voice mail  if this is an option. Please advise

## 2021-09-11 NOTE — Progress Notes (Signed)
Virtual Visit via Telephone The purpose of this virtual visit is to provide medical care while limiting exposure to the novel coronavirus (COVID19) for both patient and office staff.  Consent was obtained for phone visit:  Yes.   Answered questions that patient had about telehealth interaction:  Yes.   I discussed the limitations, risks, security and privacy concerns of performing an evaluation and management service by telephone. I also discussed with the patient that there may be a patient responsible charge related to this service. The patient expressed understanding and agreed to proceed.  Patient Location: Home Provider Location: [facility] (Office)  Participants in virtual visit: - Patient: Andrea Mcdonald - CMA: Orinda Kenner, [place] - Provider: Dr Parks Ranger  ---------------------------------------------------------------------- Chief Complaint  Patient presents with   Hematuria    S: Reviewed CMA documentation. I have called patient and gathered additional HPI as follows:  Hematuria, Gross Possible UTI Reports that symptoms started symptoms of urinary pressure and frequency and blood seen in urine with red tinge and blood color to urine. Admits lately drinking a lot of pepsi and soda a lot more instead of water. - She had history of blood in urine. - Also has some back pain. She is going to chiropractor  Non smoker   Denies any fevers, chills, sweats, body ache, cough, shortness of breath, sinus pain or pressure, headache, abdominal pain, diarrhea  Past Medical History:  Diagnosis Date   Diabetes mellitus without complication (HCC)    High cholesterol    Plantar fasciitis, bilateral 05/25/2013   Social History   Tobacco Use   Smoking status: Never   Smokeless tobacco: Never  Vaping Use   Vaping Use: Never used  Substance Use Topics   Alcohol use: Yes    Comment: drink occ   Drug use: No    Current Outpatient Medications:     cephALEXin (KEFLEX) 500 MG capsule, Take 1 capsule (500 mg total) by mouth 3 (three) times daily. For 7 days, Disp: 21 capsule, Rfl: 0   albuterol (VENTOLIN HFA) 108 (90 Base) MCG/ACT inhaler, Inhale 2 puffs into the lungs every 4 (four) hours as needed for wheezing or shortness of breath (cough)., Disp: 6.7 each, Rfl: 0   aspirin 81 MG chewable tablet, Chew 81 mg by mouth at bedtime. , Disp: , Rfl:    EQ LORATADINE 10 MG tablet, Take 1 tablet by mouth once daily, Disp: 90 tablet, Rfl: 3   fluticasone (FLONASE) 50 MCG/ACT nasal spray, Use 2 spray(s) in each nostril once daily, Disp: 48 g, Rfl: 1   lisinopril (ZESTRIL) 5 MG tablet, Take 1 tablet (5 mg total) by mouth daily., Disp: 90 tablet, Rfl: 3   meloxicam (MOBIC) 15 MG tablet, Take 15 mg by mouth daily., Disp: , Rfl:    metFORMIN (GLUCOPHAGE-XR) 500 MG 24 hr tablet, Take 1 tablet (500 mg total) by mouth 2 (two) times daily with a meal., Disp: 180 tablet, Rfl: 3   ondansetron (ZOFRAN ODT) 4 MG disintegrating tablet, Take 1 tablet (4 mg total) by mouth every 8 (eight) hours as needed for nausea or vomiting., Disp: 30 tablet, Rfl: 0   PAZEO 0.7 % SOLN, , Disp: , Rfl:    rosuvastatin (CRESTOR) 20 MG tablet, Take 1 tablet (20 mg total) by mouth at bedtime., Disp: 90 tablet, Rfl: 3   sertraline (ZOLOFT) 100 MG tablet, Take 1.5 tablets (150 mg total) by mouth at bedtime., Disp: 135 tablet, Rfl: 3   TRULICITY 1.5  MG/0.5ML SOPN, Inject 1.5 mg into the skin once a week., Disp: 6 mL, Rfl: 3  Depression screen [facility] 2/9 07/23/2020 07/21/2019 01/21/2019  Decreased Interest 0 0 0  Down, Depressed, Hopeless 0 0 1  PHQ - 2 Score 0 0 1  Altered sleeping 0 0 1  Tired, decreased energy 0 1 0  Change in appetite 0 1 1  Feeling bad or failure about yourself  0 0 0  Trouble concentrating 0 1 2  Moving slowly or fidgety/restless 0 0 0  Suicidal thoughts 0 0 0  PHQ-9 Score 0 3 5  Difficult doing work/chores Not difficult at all Not difficult at all Not difficult at  all  Some recent data might be hidden    GAD 7 : Generalized Anxiety Score 07/23/2020 07/21/2019 01/21/2019 10/22/2018  Nervous, Anxious, on Edge _0 Control/stop worrying _1 Worry too much - different things _2 Trouble relaxing 0 _3 Restless 0 _4 Easily annoyed or irritable 0 _5 Afraid - awful might happen 0 _6 Total GAD 7 Score _7 Anxiety Difficulty Not difficult at all Not difficult at all Not difficult at all Not difficult at all    -------------------------------------------------------------------------- O: No physical exam performed due to remote telephone encounter.  Lab results reviewed.  Recent Results (from the past 2160 hour(s))  TSH     Status: None   Collection Time: 08/26/21  3:27 PM  Result Value Ref Range   TSH 2.36 0.40 - 4.50 mIU/L  Hemoglobin A1c     Status: Abnormal   Collection Time: 08/26/21  3:27 PM  Result Value Ref Range   Hgb A1c MFr Bld 7.4 (H) <5.7 % of total Hgb    Comment: For someone without known diabetes, a hemoglobin A1c value of 6.5% or greater indicates that they may have  diabetes and this should be confirmed with a follow-up  test. . For someone with known diabetes, a value <7% indicates  that their diabetes is well controlled and a value  greater than or equal to 7% indicates suboptimal  control. A1c targets should be individualized based on  duration of diabetes, age, comorbid conditions, and  other considerations. . Currently, no consensus exists regarding use of hemoglobin A1c for diagnosis of diabetes for children. .    Mean Plasma Glucose 166 mg/dL   eAG (mmol/L) 9.2 mmol/L  Lipid panel     Status: None   Collection Time: 08/26/21  3:27 PM  Result Value Ref Range   Cholesterol 120 <200 mg/dL   HDL 62 > OR = 50 mg/dL   Triglycerides 76 <150 mg/dL   LDL Cholesterol (Calc) 42 mg/dL (calc)    Comment: Reference range: <100 . Desirable range <100 mg/dL for primary prevention;   <70  mg/dL for patients with CHD or diabetic patients  with > or = 2 CHD risk factors. Marland Kitchen LDL-C is now calculated using the Martin-Hopkins  calculation, which is a validated novel method providing  better accuracy than the Friedewald equation in the  estimation of LDL-C.  Cresenciano Genre et al. Annamaria Helling. 9191;660(60): 2061-2068  (http://education.[facility].com/faq/FAQ164)    Total CHOL/HDL Ratio 1.9 <5.0 (calc)   Non-HDL Cholesterol (Calc) 58 <130 mg/dL (calc)    Comment: For patients with diabetes plus 1 major ASCVD risk  factor, treating to a non-HDL-C goal of <100 mg/dL  (LDL-C  of <70 mg/dL) is considered a therapeutic  option.   CBC with Differential/Platelet     Status: None   Collection Time: 08/26/21  3:27 PM  Result Value Ref Range   WBC 6.7 3.8 - 10.8 Thousand/uL   RBC 4.09 3.80 - 5.10 Million/uL   Hemoglobin 12.1 11.7 - 15.5 g/dL   HCT 36.2 35.0 - 45.0 %   MCV 88.5 80.0 - 100.0 fL   MCH 29.6 27.0 - 33.0 pg   MCHC 33.4 32.0 - 36.0 g/dL   RDW 13.6 11.0 - 15.0 %   Platelets 172 140 - 400 Thousand/uL   MPV 10.8 7.5 - 12.5 fL   Neutro Abs 3,980 1,500 - 7,800 cells/uL   Lymphs Abs 2,291 850 - 3,900 cells/uL   Absolute Monocytes 382 200 - 950 cells/uL   Eosinophils Absolute 27 15 - 500 cells/uL   Basophils Absolute 20 0 - 200 cells/uL   Neutrophils Relative % 59.4 %   Total Lymphocyte 34.2 %   Monocytes Relative 5.7 %   Eosinophils Relative 0.4 %   Basophils Relative 0.3 %  COMPLETE METABOLIC PANEL WITH GFR     Status: None   Collection Time: 08/26/21  3:27 PM  Result Value Ref Range   Glucose, Bld 111 65 - 139 mg/dL    Comment: .        Non-fasting reference interval .    BUN 16 7 - 25 mg/dL   Creat 0.71 0.50 - 1.05 mg/dL   eGFR 97 > OR = 60 mL/min/1.47m    Comment: The eGFR is based on the CKD-EPI 2021 equation. To calculate  the new eGFR from a previous Creatinine or Cystatin C result, go to https://www.kidney.org/professionals/ kdoqi/gfr%5Fcalculator     BUN/Creatinine Ratio NOT APPLICABLE 6 - 22 (calc)   Sodium 139 135 - 146 mmol/L   Potassium 4.4 3.5 - 5.3 mmol/L   Chloride 106 98 - 110 mmol/L   CO2 26 20 - 32 mmol/L   Calcium 9.9 8.6 - 10.4 mg/dL   Total Protein 6.8 6.1 - 8.1 g/dL   Albumin 4.8 3.6 - 5.1 g/dL   Globulin 2.0 1.9 - 3.7 g/dL (calc)   AG Ratio 2.4 1.0 - 2.5 (calc)   Total Bilirubin 1.0 0.2 - 1.2 mg/dL   Alkaline phosphatase (APISO) 56 37 - 153 U/L   AST 15 10 - 35 U/L   ALT 18 6 - 29 U/L  POCT Urinalysis Dipstick     Status: Abnormal   Collection Time: 09/11/21  4:33 PM  Result Value Ref Range   Color, UA Amber    Clarity, UA Cloudy    Glucose, UA Positive (A) Negative   Bilirubin, UA Negative    Ketones, UA Negative    Spec Grav, UA >=1.030 (A) 1.010 - 1.025   Blood, UA Large +++    pH, UA 5.0 5.0 - 8.0   Protein, UA Positive (A) Negative   Urobilinogen, UA 0.2 0.2 or 1.0 E.U./dL   Nitrite, UA Negative    Leukocytes, UA Trace (A) Negative   Appearance     Odor      -------------------------------------------------------------------------- A&P:  Problem List Items Addressed This Visit   None Visit Diagnoses     Gross hematuria    -  Primary   Relevant Orders   POCT Urinalysis Dipstick (Completed)   Urine Culture   Ambulatory referral to Urology   Acute cystitis with hematuria       Relevant Medications  cephALEXin (KEFLEX) 500 MG capsule   Other Relevant Orders   Ambulatory referral to Urology      Clinically consistent with UTI however gross hematuria currently. No recent UTIs or abx courses.  Known DM2 No concern for pyelo today (no systemic symptoms, neg fever, back pain, n/v).  Plan: UA large blood 2. Ordered Urine culture 3. Keflex 585m TID x 7 days 4. Improve PO hydration  referral to Urology for gross hematuria, history most suggestive of UTI. Non smoker. No prior hematuria episodes. Has history of nephrolithiasis but not characteristic. Age [age], with large gross  hematuria, will refer for consult and further Gross Hematuria work up as indicated.    Orders Placed This Encounter  Procedures   Urine Culture   Ambulatory referral to Urology    Referral Priority:   Routine    Referral Type:   Consultation    Referral Reason:   Specialty Services Required    Requested Specialty:   Urology    Number of Visits Requested:   1   POCT Urinalysis Dipstick      Meds ordered this encounter  Medications   cephALEXin (KEFLEX) 500 MG capsule    Sig: Take 1 capsule (500 mg total) by mouth 3 (three) times daily. For 7 days    Dispense:  21 capsule    Refill:  0    Follow-up: - Return PRN  Patient verbalizes understanding with the above medical recommendations including the limitation of remote medical advice.  Specific follow-up and call-back criteria were given for patient to follow-up or seek medical care more urgently if needed.   - Time spent in direct consultation with patient on phone: 15 minutes   ANobie Putnam [facility] 09/11/2021, 5:03 PM

## 2021-09-11 NOTE — Telephone Encounter (Signed)
Patient dropped off urine this afternoon and she is doing a virtual visit with Dr. Raliegh Ip at 4pm.

## 2021-09-11 NOTE — Progress Notes (Signed)
No show for virtual apt

## 2021-09-11 NOTE — Telephone Encounter (Signed)
I called around 11am yesterday and it is documented that I told her she could come leave a sample and have a telephone visit at 4. I left a message like she said we could do advising her to do that. She did not bring in a sample nor answer at 4 yesterday.

## 2021-09-13 LAB — URINE CULTURE
MICRO NUMBER:: 12649511
Result:: NO GROWTH
SPECIMEN QUALITY:: ADEQUATE

## 2021-09-16 ENCOUNTER — Encounter: Payer: Self-pay | Admitting: Occupational Therapy

## 2021-09-16 ENCOUNTER — Ambulatory Visit: Payer: No Typology Code available for payment source | Admitting: Family Medicine

## 2021-09-16 ENCOUNTER — Other Ambulatory Visit: Payer: Self-pay

## 2021-09-16 ENCOUNTER — Ambulatory Visit: Payer: PRIVATE HEALTH INSURANCE | Attending: Specialist | Admitting: Occupational Therapy

## 2021-09-16 DIAGNOSIS — R278 Other lack of coordination: Secondary | ICD-10-CM

## 2021-09-16 DIAGNOSIS — M6281 Muscle weakness (generalized): Secondary | ICD-10-CM | POA: Diagnosis not present

## 2021-09-16 NOTE — Therapy (Addendum)
[place] MAIN [facility] SERVICES [address] [place], [place], [address] Phone: [phone]   Fax:  [phone]  Occupational Therapy Evaluation  Patient Details  Name: Andrea Mcdonald MRN: [id number] Date of Birth: 03/07/1961 Referring Provider (OT): Dr.Howard Sabra Heck   Encounter Date: 09/16/2021   OT End of Session - 09/16/21 1754     Visit Number 1    Number of Visits 12    Date for OT Re-Evaluation 10/28/21    Authorization Type Progress report period starting 09/16/2021    OT Start Time 1605    OT Stop Time 1710    OT Time Calculation (min) 65 min    Activity Tolerance Patient tolerated treatment well    Behavior During Therapy [facility] for tasks assessed/performed             Past Medical History:  Diagnosis Date   Diabetes mellitus without complication ([place])    High cholesterol    Plantar fasciitis, bilateral 05/25/2013    Past Surgical History:  Procedure Laterality Date   AUGMENTATION MAMMAPLASTY     COLONOSCOPY WITH PROPOFOL N/A 09/14/2020   Procedure: COLONOSCOPY WITH PROPOFOL;  Surgeon: Jonathon Bellows, MD;  Location: [facility] ENDOSCOPY;  Service: Gastroenterology;  Laterality: N/A;   ESOPHAGOGASTRIC FUNDOPLICATION  8588   at [facility], for GERD   [place]     x2   PARTIAL HYSTERECTOMY     PLACEMENT OF BREAST IMPLANTS      There were no vitals filed for this visit.   Subjective Assessment - 09/16/21 1741     Subjective  Pt. reports that she has not had therapy yet.    Patient is accompanied by: Family member    Pertinent History Pt. is a 60 y.o. female  who was diagnosed with a Right 3rd digit trigger finger, and Dupuytrens. Pt. underwent a Trigger FInger Release on August 15th, 2022. Pt. has continued to have pain, edema, numbness, and tingling in the 3rd digit. Pt. presents with palmar scar tissue on the volar surface of the hand. Pt. has had a recent onset of triggering in the  right 4th digit. PMHx includes: DM, Right RTC repair, neck/back issues. Pt. works full-time in Actuary at [employer]. Pt. enjoys camping, fishing with her husband, and spending time with her grandchildren.    Limitations Dominant right hand strength, ROM    Patient Stated Goals To be able to lift heavier items.    Currently in Pain? Yes    Pain Score 5     Pain Location Finger (Comment which one)    Pain Orientation Right    Pain Descriptors / Indicators Sore    Pain Type Chronic pain    Pain Onset More than a month ago               [facility] OT Assessment - 09/16/21 1622       Assessment   Medical Diagnosis 3rd digit Trigger finger, Dupuytren's    Referring Provider (OT) Dr.Howard Sabra Heck    Onset Date/Surgical Date 06/10/21    Hand Dominance Right    Next MD Visit Mid December      Precautions   Precautions None      Restrictions   Weight Bearing Restrictions No      Balance Screen   Has the patient fallen in the past 6 months No      Home  Environment  Family/patient expects to be discharged to: Private residence    Living Arrangements Spouse/significant other    Available Help at Discharge Family    Type of [place] One level    [facility] bed;Walker - 2 wheels;Shower seat;Wheelchair - manual;Bedside commode   Has the equipment avaliable for her mother   Lives With Spouse      Prior Function   Level of Independence Independent    Vocation Full time employment    Psychologist, counselling at the [employer], fishing, grandchildren, watching TV      ADL   Eating/Feeding Independent   Pt. has difficulty opening bottles, and jars.   Grooming Independent   difficulty squeezing shampoo bottle   Upper Body Bathing Independent    Lower Body Bathing Independent    Upper Body Dressing Independent    Lower Body  Dressing Independent   Difficulty pulling up compression hose with her right hand   Toilet Transfer Independent    [place] Transfer Independent      IADL   Prior Level of [place] care of all shopping needs independently   Has difficulty picking up heavier items at the grocery store.   Prior Level of Function Light Housekeeping Independent    Light Housekeeping Maintains house alone or with occasional assistance   Pt. has difficulty with holding the vacuum for extended periods.   Prior Level of Function Meal Prep Independent    Meal Prep Plans, prepares and serves adequate meals independently   Difficulty holding heavier bowls.   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    Prior Level of Function Medication Managment Independent    Medication Management Is responsible for taking medication in correct dosages at correct time   Independent with pillbox set-up   Prior Level of Function Therapist, sports financial matters independently (budgets, writes checks, pays rent, bills goes to bank), collects and keeps track of income      Written Expression   Dominant Hand Right    Written Experience --   No changes     Vision - History   Baseline Vision Wears glasses only for reading      Cognition   Overall Cognitive Status Within Functional Limits for tasks assessed      Observation/Other Assessments   Focus on Therapeutic Outcomes (FOTO)  FOTO: 50, TR score: 65      Sensation   Light Touch Impaired by gross assessment   Numbnes at the distal tip of the 3rd digit, as well as intermittent  tingling from the 3rd digit PIP distally     Coordination   Gross Motor Movements are Fluid and Coordinated Yes    Fine Motor Movements are Fluid and Coordinated No    Right 9  Hole Peg Test 23 sec.   Pt. reports the movement does not feel natural.   Left 9 Hole Peg Test 28 sec.      Edema   Edema RIght 3rd digit edema. Pt. reports this is worse in the morning.      AROM   Overall AROM Comments Right wrist extension: 54, ulnar deviation: 18, radial  deviation 12,  3rd digit MP: -10 extension lag, Digit flexion to [facility]: 2nd: 0cm, 3rd: 1cm, 4th: 0cm, 5th: 0cm.      Hand Function   Right Hand Grip (lbs) 17#    Right Hand Lateral Pinch 10 lbs    Right Hand 3 Point Pinch 7 lbs    Left Hand Grip (lbs) 27#    Left Hand Lateral Pinch 11 lbs    Left 3 point pinch 10 lbs            Ther. Ex:  Pt. education was provided about right digit extension exercises at the tabletop, as well as tendon glides in preparation for fisting. Visual demonstration, and cues were provided for proper technique, and hand position. Pt. Was provided with visual handout/HEP.                  OT Education - 09/16/21 1621     Education Details OT services, POC, goals    Person(s) Educated Patient    Methods Explanation    Comprehension Verbalized understanding              OT Short Term Goals - 09/16/21 1816       OT SHORT TERM GOAL #1   Title Pt. will demonstrate independence with HEP, for ther. ex, edema control,a nd scar management.    Baseline Eval: No current HEP    Time 3    Period Weeks    Status New    Target Date 10/07/21               OT Long Term Goals - 09/16/21 1817       OT LONG TERM GOAL #1   Title Pt. will improve FOTO score by 2 points for clinically relevent ADL/IADL improvement.    Baseline Eval: FOTO score: 50, TR score: 65    Time 6    Period Weeks    Status New    Target Date 10/28/21      OT LONG TERM GOAL #2   Title Pt. will improve right grip strength by 5# to be able to squeeze a shampoo bottle.    Baseline Eval: R: 17# L: 27# Pt. is unable to squeeze a shampoo bottle    Time 6    Period Weeks    Status New     Target Date 10/28/21      OT LONG TERM GOAL #3   Title Pt. will improve right 3pt. pinch strength to be able to pull up compression hose    Baseline Eval: R: 7# L: 10# pt. has difficulty pulling up compression hose.    Time 6    Period Weeks    Status New    Target Date 10/28/21      OT LONG TERM GOAL #4   Title Pt. will improve right palmar scar tissue to be able to achieve full 3rd digit MP extension without pain during daily tasks.    Baseline Eval: right 3rd digit MP extension -10    Time 6    Period Weeks    Status New    Target Date 10/28/21      OT LONG TERM GOAL #5   Title Pt. will independently hold and use a vacuum without pain.    Baseline Eval: pt. has difficulty holding,a nd using a vacuum cleaner.    Time 6    Period Weeks    Status New    Target Date 10/28/21  Long Term Additional Goals   Additional Long Term Goals Yes      OT LONG TERM GOAL #6   Title Pt. will improve right lateral pinch to be able to open bottles.    Baseline 09/16/2021: R: 10#, L: 11#    Time 12    Period Weeks    Status New    Target Date 10/28/21                   Plan - 09/16/21 1755     Clinical Impression Statement Pt. Is a 61 y.o. female who was diagnosed with a right  Clinical Impression Statement Pt. Is a 60 y.o. female who was diagnosed with a right 3rd digit trigger finger, and Dupuytrens in her dominant hand. Pt. underwent a trigger finger release on Auguust 15th, 2022. Pt. presents with 5/10 pain in the right 3rd digit, edema, numbness at the distal tip of the 3rd digit, intermittent tingling from the PIP distally, scar tisssue at the palmar incison site on the volar surface of the hand. Pt. presents with decreased 3rd digit MP extension, decreased 3rd digit flexion to the [facility], decreased strength, and impaired [facility] which limit her ability to pick up heavy bowls, heavy items at the grocery store, hold a vacuum while using it, opening bottles, pull up compression hose,  and squeeze shampoo bottles. Pt. will beneift from OT serivces to work on improving right dominant hand 3rd digit edema, pain, ROM, strength, [place] skills, and provide education about scar management, HEP, pain, and edema control techniques in order to improve right hand function during daily ADLs, IADL, and work related tasks.     OT Occupational Profile and History Detailed Assessment- Review of Records and additional review of physical, cognitive, psychosocial history related to current functional performance    Occupational performance deficits (Please refer to evaluation for details): ADL's;IADL's;Leisure;Work    Marketing executive / Function / Physical Skills ADL;ROM;Strength;FMC;Dexterity;IADL;Scar mobility;UE functional use;Coordination    Rehab Potential Good    Clinical Decision Making Several treatment options, min-mod task modification necessary    Comorbidities Affecting Occupational Performance: May have comorbidities impacting occupational performance    Modification or Assistance to Complete Evaluation  Min-Moderate modification of tasks or assist with assess necessary to complete eval    OT Frequency 2x / week    OT Duration 6 weeks    OT Treatment/Interventions Self-care/ADL training;Therapeutic exercise;Ultrasound;Moist Heat;Therapeutic activities;Patient/family education;Contrast Bath;DME and/or AE instruction;Paraffin;Manual Therapy;Scar mobilization;Splinting;Energy conservation    Consulted and Agree with Plan of Care Patient             Patient will benefit from skilled therapeutic intervention in order to improve the following deficits and impairments:   Body Structure / Function / Physical Skills: ADL, ROM, Strength, FMC, Dexterity, IADL, Scar mobility, UE functional use, Coordination       Visit Diagnosis: Muscle weakness (generalized)  Other lack of coordination    Problem List Patient Active Problem List   Diagnosis Date Noted   Acquired trigger finger of  right middle finger 08/12/2021   Major depression, recurrent, full remission ([place]) 07/23/2020   Restless leg syndrome 04/20/2018   Leg pain 04/02/2018   Chronic venous insufficiency 04/02/2018   Diarrhea, functional 03/16/2017   Vitamin D deficiency 07/08/2016   Allergic rhinitis 02/12/2016   Overweight (BMI 25.0-29.9) 02/12/2016   RUQ abdominal pain 12/11/2015   Abdominal pain, LLQ (left lower quadrant) 12/11/2015   Type 2 diabetes mellitus with other specified complication ([place]) 99/35/7017  Hyperlipidemia associated with type 2 diabetes mellitus ([place]) 06/07/2015   Essential hypertension 06/07/2015   Anxiety 06/07/2015   Plantar fasciitis, bilateral     Harrel Carina, MS, OTR/L 09/16/2021, 6:36 PM  [place] MAIN [facility] SERVICES [address] [place], [place], [address] Phone: [phone]   Fax:  [phone]  Name: STACEY SAGO MRN: [id number] Date of Birth: 21-Feb-1961

## 2021-09-17 ENCOUNTER — Ambulatory Visit
Admission: RE | Admit: 2021-09-17 | Discharge: 2021-09-17 | Disposition: A | Discharge status: Home / Self Care | Payer: PRIVATE HEALTH INSURANCE | Source: Ambulatory Visit | Attending: Family Medicine | Admitting: Family Medicine

## 2021-09-17 DIAGNOSIS — Z1231 Encounter for screening mammogram for malignant neoplasm of breast: Secondary | ICD-10-CM | POA: Insufficient documentation

## 2021-09-18 ENCOUNTER — Encounter: Payer: Self-pay | Admitting: Occupational Therapy

## 2021-09-18 ENCOUNTER — Other Ambulatory Visit: Payer: Self-pay

## 2021-09-18 ENCOUNTER — Ambulatory Visit: Payer: PRIVATE HEALTH INSURANCE | Admitting: Occupational Therapy

## 2021-09-18 DIAGNOSIS — R278 Other lack of coordination: Secondary | ICD-10-CM

## 2021-09-18 DIAGNOSIS — M6281 Muscle weakness (generalized): Secondary | ICD-10-CM

## 2021-09-18 NOTE — Therapy (Signed)
[place] MAIN [facility] SERVICES [address] [place], [place], [address] Phone: [phone]   Fax:  [phone]  Occupational Therapy Treatment  Patient Details  Name: Andrea Mcdonald MRN: [id number] Date of Birth: 07-18-1961 Referring Provider (OT): Dr.Howard Sabra Heck   Encounter Date: 09/18/2021   OT End of Session - 09/18/21 1718     Visit Number 2    Number of Visits 12    Date for OT Re-Evaluation 10/28/21    Authorization Type Progress report period starting 09/16/2021    OT Start Time 1600    OT Stop Time 1655    OT Time Calculation (min) 55 min    Activity Tolerance Patient tolerated treatment well    Behavior During Therapy [facility] for tasks assessed/performed             Past Medical History:  Diagnosis Date   Diabetes mellitus without complication ([place])    High cholesterol    Plantar fasciitis, bilateral 05/25/2013    Past Surgical History:  Procedure Laterality Date   AUGMENTATION MAMMAPLASTY     COLONOSCOPY WITH PROPOFOL N/A 09/14/2020   Procedure: COLONOSCOPY WITH PROPOFOL;  Surgeon: Jonathon Bellows, MD;  Location: [facility] ENDOSCOPY;  Service: Gastroenterology;  Laterality: N/A;   ESOPHAGOGASTRIC FUNDOPLICATION  5364   at [facility], for GERD   [place]     x2   PARTIAL HYSTERECTOMY     PLACEMENT OF BREAST IMPLANTS      There were no vitals filed for this visit.   Subjective Assessment - 09/18/21 1712     Subjective  Pt. reports that she has been having  8-9/10 pain in her right hand.    Patient is accompanied by: Family member    Pertinent History Pt. is a 60 y.o. female  who was was diagnosed with a Right 3rd digit trigger finger, and Dupuytrens. Pt. underwent a Trigger FInger Release on August 15th, 2022. Pt. has continued to have pain, edema, numbness, and tingling in the 3rd digit. Pt. presents with palmar scar tissue on the volar surface of the hand. Pt. has had a recent  onset of triggering in the right 4th digit. PMHx includes: DM, Right RTC repair, neck/back issues. Pt. works full-time in Actuary at [employer]. Pt. enjoys camping, fishing with her husband, and spending time with her grandchildren.    Currently in Pain? Yes    Pain Score 9     Pain Location Hand   Palmar aspect of the right hand readitaing from the base of the 3rd digit distally with numbness at the distal tip of her 3rd digit.   Pain Orientation Right    Pain Descriptors / Indicators Sore    Pain Type Chronic pain    Pain Onset More than a month ago            OT TREATMENT    Contrast Bath:   Pt. Tolerated contrasting moist heat for 3 min. followed by 2 min. cold pack alternating for 3 reps each for edema control to the right hand in preparation for ROM, and manual therapy.  Therapeutic Exercise:  Pt. Performed digit extension exercises using a 1" foam roll at the tabletop. Pressure was adjusted and modified as pt. Moved the roll proximally to distally for the base of the palm to the tips of the digits. Pt. Performed reps of  Active Digit MP flexion with PIP extension.  Cues were provided for form, and techniques with passive stretching to for 3rd digit extension.   Manual Therapy:  Pt. tolerated soft tissue massage for scar management at the palmar surface of the hand at the base of the 3rd, and 4th digits. Cross friction massage, and circular motion was performed. Retrograde massage was performed to the right 3rd digit. Pt. Education was provided about scar massage, and retrograde massage. Pt. was able to return demonstration with consistent cues for form, and technique. Manual therapy was performed in independent of, and in preparation for there. Ex, and ROM.   Upon arrival, pt. reported 8-9/10 pain in her right 3rd digit radiating from the base of the MP to the PIP, as well as numbess/tingling at the distal tip of the digit. Pt.'s Pain, edema, increased scar  tissue, limited ROM and strength, are interfering with her ability to perform daily tasks efficiently  Pt. Was fit for, and provided with Cica-care scar pad to the palmar surface of the  right hand. Pt. Was assessed and fit for an edema sleeve for the right 3rd digit.  Pt. Was provided with education about application, removal, and care of both the scar pad, and edema sleeve.  Pt. Education was provided about HEPs for the the exercises, scar management, and edema control. Pt. Continues to benefit from OT services to work on improving pain, scar management, ROM, strength, and coordination in order to be able to use her dominant right hand efficiently during daily ADL, IADLs, and work related tasks with less pain.                      OT Education - 09/18/21 1717     Education Details scar massage, scar pad, edema control, HEP    Person(s) Educated Patient    Methods Explanation    Comprehension Verbalized understanding              OT Short Term Goals - 09/16/21 1816       OT SHORT TERM GOAL #1   Title Pt. will demonstrate independence with HEP, for ther. ex, edema control,a nd scar management.    Baseline Eval: No current HEP    Time 3    Period Weeks    Status New    Target Date 10/07/21               OT Long Term Goals - 09/16/21 1817       OT LONG TERM GOAL #1   Title Pt. will improve FOTO score by 2 points for clinically relevent ADL/IADL improvement.    Baseline Eval: FOTO score: 50, TR score: 65    Time 6    Period Weeks    Status New    Target Date 10/28/21      OT LONG TERM GOAL #2   Title Pt. will improve right grip strength by 5# to be able to squeeze a shampoo bottle.    Baseline Eval: R: 17# L: 27# Pt. is unable to squeeze a shampoo bottle    Time 6    Period Weeks    Status New    Target Date 10/28/21      OT LONG TERM GOAL #3   Title Pt. will improve right 3pt. pinch strength to be able to pull up compression hose    Baseline Eval:  R: 7# L: 10# pt. has difficulty pulling up compression hose.    Time 6    Period Weeks  Status New    Target Date 10/28/21      OT LONG TERM GOAL #4   Title Pt. will improve right palmar scar tissue to be able to achieve full 3rd digit MP extension without pain during daily tasks.    Baseline Eval: right 3rd digit MP extension -10    Time 6    Period Weeks    Status New    Target Date 10/28/21      OT LONG TERM GOAL #5   Title Pt. will independently hold and use a vacuum without pain.    Baseline Eval: pt. has difficulty holding,a nd using a vacuum cleaner.    Time 6    Period Weeks    Status New    Target Date 10/28/21      Long Term Additional Goals   Additional Long Term Goals Yes      OT LONG TERM GOAL #6   Title Pt. will improve right lateral pinch to be able to open bottles.    Baseline 09/16/2021: R: 10#, L: 11#    Time 12    Period Weeks    Status New    Target Date 10/28/21                   Plan - 09/18/21 1718     Clinical Impression Statement Upon arrival, pt. reported 8-9/10 pain in her right 3rd digit radiating from the base of the MP to the PIP, as well as numbess/tingling at the distal tip of the digit. Pt.'s Pain, edema, increased scar tissue, limited ROM and strength, are interfering with her ability to perform daily tasks efficiently  Pt. Was fit for, and provided with Cica-care scar pad to the palmar surface of the  right hand. Pt. Was assessed and fit for an edema sleeve for the right 3rd digit.  Pt. Was provided with education about application, removal, and care of both the scar pad, and edema sleeve.  Pt. Education was provided about HEPs for the the exercises, scar management, and edema control. Pt. Continues to benefit from OT services to work on improving pain, scar management, ROM, strength, and coordination in order to be able to use her dominant right hand efficiently during daily ADL, IADLs, and work related tasks with less pain.          OT Occupational Profile and History Detailed Assessment- Review of Records and additional review of physical, cognitive, psychosocial history related to current functional performance    Occupational performance deficits (Please refer to evaluation for details): ADL's;IADL's;Leisure;Work    Marketing executive / Function / Physical Skills ADL;ROM;Strength;FMC;Dexterity;IADL;Scar mobility;UE functional use;Coordination    Rehab Potential Good    Clinical Decision Making Several treatment options, min-mod task modification necessary    Comorbidities Affecting Occupational Performance: May have comorbidities impacting occupational performance    Modification or Assistance to Complete Evaluation  Min-Moderate modification of tasks or assist with assess necessary to complete eval    OT Frequency 2x / week    OT Duration 6 weeks    OT Treatment/Interventions Self-care/ADL training;Therapeutic exercise;Ultrasound;Moist Heat;Therapeutic activities;Patient/family education;Contrast Bath;DME and/or AE instruction;Paraffin;Manual Therapy;Scar mobilization;Splinting;Energy conservation    Consulted and Agree with Plan of Care Patient             Patient will benefit from skilled therapeutic intervention in order to improve the following deficits and impairments:   Body Structure / Function / Physical Skills: ADL, ROM, Strength, FMC, Dexterity, IADL, Scar mobility, UE functional use,  Coordination       Visit Diagnosis: Muscle weakness (generalized)  Other lack of coordination    Problem List Patient Active Problem List   Diagnosis Date Noted   Acquired trigger finger of right middle finger 08/12/2021   Major depression, recurrent, full remission ([place]) 07/23/2020   Restless leg syndrome 04/20/2018   Leg pain 04/02/2018   Chronic venous insufficiency 04/02/2018   Diarrhea, functional 03/16/2017   Vitamin D deficiency 07/08/2016   Allergic rhinitis 02/12/2016   Overweight (BMI  25.0-29.9) 02/12/2016   RUQ abdominal pain 12/11/2015   Abdominal pain, LLQ (left lower quadrant) 12/11/2015   Type 2 diabetes mellitus with other specified complication ([place]) 35/82/5189   Hyperlipidemia associated with type 2 diabetes mellitus ([place]) 06/07/2015   Essential hypertension 06/07/2015   Anxiety 06/07/2015   Plantar fasciitis, bilateral     Harrel Carina, MS, OTR/L 09/18/2021, 5:31 PM  [place] MAIN [facility] SERVICES [address] [place], [place], [address] Phone: [phone]   Fax:  [phone]  Name: Andrea Mcdonald MRN: [id number] Date of Birth: [birth year]

## 2021-09-23 ENCOUNTER — Other Ambulatory Visit: Payer: Self-pay

## 2021-09-23 ENCOUNTER — Encounter: Payer: Self-pay | Admitting: Occupational Therapy

## 2021-09-23 ENCOUNTER — Ambulatory Visit: Payer: PRIVATE HEALTH INSURANCE | Admitting: Occupational Therapy

## 2021-09-23 DIAGNOSIS — M6281 Muscle weakness (generalized): Secondary | ICD-10-CM

## 2021-09-23 NOTE — Therapy (Signed)
[place] [facility] SERVICES [address] [place], [place], [address] Phone: [phone]   Fax:  [phone]  Occupational Therapy Treatment  Patient Details  Name: Andrea Mcdonald MRN: [id number] Date of Birth: 1960/11/15 Referring Provider (OT): Dr.Howard Sabra Heck   Encounter Date: 09/23/2021   OT End of Session - 09/23/21 1719     Visit Number 3    Number of Visits 12    Date for OT Re-Evaluation 10/28/21    Authorization Type Progress report period starting 09/16/2021    OT Start Time 1605    OT Stop Time 1700    OT Time Calculation (min) 55 min    Activity Tolerance Patient tolerated treatment well    Behavior During Therapy [facility] for tasks assessed/performed             Past Medical History:  Diagnosis Date   Diabetes mellitus without complication ([place])    High cholesterol    Plantar fasciitis, bilateral 05/25/2013    Past Surgical History:  Procedure Laterality Date   AUGMENTATION MAMMAPLASTY     COLONOSCOPY WITH PROPOFOL N/A 09/14/2020   Procedure: COLONOSCOPY WITH PROPOFOL;  Surgeon: Jonathon Bellows, MD;  Location: [facility] ENDOSCOPY;  Service: Gastroenterology;  Laterality: N/A;   ESOPHAGOGASTRIC FUNDOPLICATION  4010   at [facility], for GERD   [place]     x2   PARTIAL HYSTERECTOMY     PLACEMENT OF BREAST IMPLANTS      There were no vitals filed for this visit.   Subjective Assessment - 09/23/21 1714     Subjective  Pt. presents with less right hand pain today    Patient is accompanied by: Family member    Pertinent History Pt. is a 60 y.o. female  who was was diagnosed with a Right 3rd digit trigger finger, and Dupuytrens. Pt. underwent a Trigger FInger Release on August 15th, 2022. Pt. has continued to have pain, edema, numbness, and tingling in the 3rd digit. Pt. presents with palmar scar tissue on the volar surface of the hand. Pt. has had a recent onset of triggering in  the right 4th digit. PMHx includes: DM, Right RTC repair, neck/back issues. Pt. works full-time in Actuary at [employer]. Pt. enjoys camping, fishing with her husband, and spending time with her grandchildren.    Currently in Pain? Yes    Pain Score 4     Pain Location Hand   Palmar aspect of the right hand, and base of the 3rd digit   Pain Orientation Right    Pain Descriptors / Indicators Sore    Pain Type Chronic pain    Pain Onset More than a month ago              OT TREATMENT     Contrast Bath:    Pt. Tolerated contrasting moist heat for 3 min. followed by 2 min. cold pack alternating for 3 reps each for edema control to the right hand in preparation for ROM, and manual therapy.   Therapeutic Exercise:   Reviewed tendon glide exercises in preparation for fisting. Pt. Performed reps of Active Digit MP flexion with PIP extension. Cues were provided for form, and techniques with passive stretching to for 3rd digit extension. Verbally reviewed position for the digit extension exercises using a foam roll.    Manual Therapy:   Pt. tolerated soft tissue massage for scar management at the  palmar surface of the hand at the base of the 3rd, and 4th digits. Cross friction massage, and circular motion was performed. Retrograde massage was performed to the right 3rd digit. Pt. Education was provided about scar massage, and retrograde massage. Pt. was able to return demonstration with consistent cues for form, and technique. Edema at the base of the 3rd, and 4th digits prior to retrograde massage: 3rd digit: 7.2cm improved to 7cm afterwards, and 4th digit: 6.3cm improved to 6cm afterwards.  Manual therapy was performed in independent of, and in preparation for there. Ex, and ROM.    Upon arrival, pt. reported 4/10 pain in her right 3rd digit radiating from the base of the MP to the PIP. Pt. Reports that she rested her hand over the holiday weekend, and returned to work today.  Pt.'s Pain, edema, increased scar tissue, limited ROM and strength continue to interfere with her ability to perform daily tasks efficiently  Pt. Was provided with additional Cica-care scar pad to the palmar surface of the  right hand, and edema sleeve for the right 3rd digit.  Pt. Responded well to the retrograde massage to the 3rd, and 4th digits with a decrease in edema of .2 cm around the base of the 3rd digit, and  .3 cm around the base of the 4th digit. Pt. Education was provided about  worked simplification strategies, and joint protection principles for the right hand for kitchen, and work related tasks. Pt. continues to benefit from OT services to work on improving pain, scar management, ROM, strength, and coordination in order to be able to use her dominant right hand efficiently during daily ADL, IADLs, and work related tasks with less pain.                           OT Education - 09/23/21 1718     Education Details Scar massage, scar pad, edema control, work simplification strategies.    Person(s) Educated Patient    Methods Explanation    Comprehension Verbalized understanding              OT Short Term Goals - 09/16/21 1816       OT SHORT TERM GOAL #1   Title Pt. will demonstrate independence with HEP, for ther. ex, edema control,a nd scar management.    Baseline Eval: No current HEP    Time 3    Period Weeks    Status New    Target Date 10/07/21               OT Long Term Goals - 09/16/21 1817       OT LONG TERM GOAL #1   Title Pt. will improve FOTO score by 2 points for clinically relevent ADL/IADL improvement.    Baseline Eval: FOTO score: 50, TR score: 65    Time 6    Period Weeks    Status New    Target Date 10/28/21      OT LONG TERM GOAL #2   Title Pt. will improve right grip strength by 5# to be able to squeeze a shampoo bottle.    Baseline Eval: R: 17# L: 27# Pt. is unable to squeeze a shampoo bottle    Time 6    Period  Weeks    Status New    Target Date 10/28/21      OT LONG TERM GOAL #3   Title Pt. will improve right 3pt. pinch strength to  be able to pull up compression hose    Baseline Eval: R: 7# L: 10# pt. has difficulty pulling up compression hose.    Time 6    Period Weeks    Status New    Target Date 10/28/21      OT LONG TERM GOAL #4   Title Pt. will improve right palmar scar tissue to be able to achieve full 3rd digit MP extension without pain during daily tasks.    Baseline Eval: right 3rd digit MP extension -10    Time 6    Period Weeks    Status New    Target Date 10/28/21      OT LONG TERM GOAL #5   Title Pt. will independently hold and use a vacuum without pain.    Baseline Eval: pt. has difficulty holding,a nd using a vacuum cleaner.    Time 6    Period Weeks    Status New    Target Date 10/28/21      Long Term Additional Goals   Additional Long Term Goals Yes      OT LONG TERM GOAL #6   Title Pt. will improve right lateral pinch to be able to open bottles.    Baseline 09/16/2021: R: 10#, L: 11#    Time 12    Period Weeks    Status New    Target Date 10/28/21                   Plan - 09/23/21 1720     Clinical Impression Statement Upon arrival, pt. reported 4/10 pain in her right 3rd digit radiating from the base of the MP to the PIP. Pt. Reports that she rested her hand over the holiday weekend, and returned to work today. Pt.'s Pain, edema, increased scar tissue, limited ROM and strength continue to interfere with her ability to perform daily tasks efficiently  Pt. Was provided with additional Cica-care scar pad to the palmar surface of the  right hand, and edema sleeve for the right 3rd digit.  Pt. Responded well to the retrograde massage to the 3rd, and 4th digits with a decrease in edema of .2 cm around the base of the 3rd digit, and  .3 cm around the base of the 4th digit. Pt. Education was provided about  worked simplification strategies, and joint  protection principles for the right hand for kitchen, and work related tasks. Pt. continues to benefit from OT services to work on improving pain, scar management, ROM, strength, and coordination in order to be able to use her dominant right hand efficiently during daily ADL, IADLs, and work related tasks with less pain.    OT Occupational Profile and History Detailed Assessment- Review of Records and additional review of physical, cognitive, psychosocial history related to current functional performance    Occupational performance deficits (Please refer to evaluation for details): ADL's;IADL's;Leisure;Work    Marketing executive / Function / Physical Skills ADL;ROM;Strength;FMC;Dexterity;IADL;Scar mobility;UE functional use;Coordination    Rehab Potential Good    Clinical Decision Making Several treatment options, min-mod task modification necessary    Comorbidities Affecting Occupational Performance: May have comorbidities impacting occupational performance    Modification or Assistance to Complete Evaluation  Min-Moderate modification of tasks or assist with assess necessary to complete eval    OT Frequency 2x / week    OT Duration 6 weeks    OT Treatment/Interventions Self-care/ADL training;Therapeutic exercise;Ultrasound;Moist Heat;Therapeutic activities;Patient/family education;Contrast Bath;DME and/or AE instruction;Paraffin;Manual Therapy;Scar mobilization;Splinting;Energy conservation  Consulted and Agree with Plan of Care Patient             Patient will benefit from skilled therapeutic intervention in order to improve the following deficits and impairments:   Body Structure / Function / Physical Skills: ADL, ROM, Strength, FMC, Dexterity, IADL, Scar mobility, UE functional use, Coordination       Visit Diagnosis: Muscle weakness (generalized)    Problem List Patient Active Problem List   Diagnosis Date Noted   Acquired trigger finger of right middle finger 08/12/2021   Major  depression, recurrent, full remission ([place]) 07/23/2020   Restless leg syndrome 04/20/2018   Leg pain 04/02/2018   Chronic venous insufficiency 04/02/2018   Diarrhea, functional 03/16/2017   Vitamin D deficiency 07/08/2016   Allergic rhinitis 02/12/2016   Overweight (BMI 25.0-29.9) 02/12/2016   RUQ abdominal pain 12/11/2015   Abdominal pain, LLQ (left lower quadrant) 12/11/2015   Type 2 diabetes mellitus with other specified complication ([place]) 53/61/4431   Hyperlipidemia associated with type 2 diabetes mellitus ([place]) 06/07/2015   Essential hypertension 06/07/2015   Anxiety 06/07/2015   Plantar fasciitis, bilateral     Harrel Carina, MS, OTR/L 09/23/2021, 5:22 PM  [place] [address] [place], [place], [address] Phone: [phone]   Fax:  [phone]  Name: Andrea Mcdonald MRN: [id number] Date of Birth: Mar 14, 1961

## 2021-09-25 ENCOUNTER — Other Ambulatory Visit: Payer: Self-pay

## 2021-09-25 ENCOUNTER — Ambulatory Visit: Payer: PRIVATE HEALTH INSURANCE | Admitting: Occupational Therapy

## 2021-09-25 ENCOUNTER — Encounter: Payer: Self-pay | Admitting: Occupational Therapy

## 2021-09-25 DIAGNOSIS — M6281 Muscle weakness (generalized): Secondary | ICD-10-CM | POA: Diagnosis not present

## 2021-09-25 NOTE — Therapy (Signed)
[place] MAIN [facility] SERVICES [address] [place], [place], [address] Phone: [phone]   Fax:  [phone]  Occupational Therapy Treatment  Patient Details  Name: Andrea Mcdonald MRN: [id number] Date of Birth: 1961/05/10 Referring Provider (OT): Dr.Howard Sabra Heck   Encounter Date: 09/25/2021   OT End of Session - 09/25/21 1820     Visit Number 4    Number of Visits 12    Date for OT Re-Evaluation 10/28/21    Authorization Type Progress report period starting 09/16/2021    OT Start Time 1605    OT Stop Time 1655    OT Time Calculation (min) 50 min    Activity Tolerance Patient tolerated treatment well    Behavior During Therapy [facility] for tasks assessed/performed             Past Medical History:  Diagnosis Date   Diabetes mellitus without complication ([place])    High cholesterol    Plantar fasciitis, bilateral 05/25/2013    Past Surgical History:  Procedure Laterality Date   AUGMENTATION MAMMAPLASTY     COLONOSCOPY WITH PROPOFOL N/A 09/14/2020   Procedure: COLONOSCOPY WITH PROPOFOL;  Surgeon: Jonathon Bellows, MD;  Location: [facility] ENDOSCOPY;  Service: Gastroenterology;  Laterality: N/A;   ESOPHAGOGASTRIC FUNDOPLICATION  9417   at [facility], for GERD   [place]     x2   PARTIAL HYSTERECTOMY     PLACEMENT OF BREAST IMPLANTS      There were no vitals filed for this visit.   Subjective Assessment - 09/25/21 1819     Subjective  Pt. presents with less right hand pain today, [place] improving    Patient is accompanied by: Family member    Pertinent History Pt. is a 60 y.o. female  who was was diagnosed with a Right 3rd digit trigger finger, and Dupuytrens. Pt. underwent a Trigger FInger Release on August 15th, 2022. Pt. has continued to have pain, edema, numbness, and tingling in the 3rd digit. Pt. presents with palmar scar tissue on the volar surface of the hand. Pt. has had a recent onset  of triggering in the right 4th digit. PMHx includes: DM, Right RTC repair, neck/back issues. Pt. works full-time in Actuary at [employer]. Pt. enjoys camping, fishing with her husband, and spending time with her grandchildren.    Currently in Pain? Yes    Pain Score 4     Pain Location Hand   Palmar aspect of the right hand at the base of the 3rd digit.   Pain Orientation Right    Pain Descriptors / Indicators Sore            OT TREATMENT     Contrast Bath:    Pt. Tolerated contrasting moist heat for 3 min. followed by 2 min. cold pack alternating for 3 reps each for edema control to the right hand in preparation for ROM, and manual therapy.   Therapeutic Exercise:   Reviewed tendon glide exercises in preparation for fisting. Pt. Performed reps of Active Digit MP flexion with PIP extension. Cues were provided for form, and techniques with passive stretching to for 3rd digit extension. Verbally reviewed position for the digit extension exercises using a foam roll.    Manual Therapy:   Pt. tolerated soft tissue massage for scar management at the palmar surface of the hand at the base of the 3rd, and 4th digits. Cross friction  massage, and circular motion was performed. Retrograde massage was performed to the right 3rd digit. Pt. Education was provided about scar massage, and retrograde massage. Pt. was able to return demonstration with consistent cues for form, and technique. Edema at the base of the 3rd, and 4th digits prior to retrograde massage: 3rd digit: 7 7 cm improved to 6.8 cm afterwards, and 4th digit: 6 cm improved to 5.9 cm afterwards.  Manual therapy was performed in independent of, and in preparation for there. Ex, and ROM.    Upon arrival, pt. reported 4/10 pain in the palmar aspect at the base of the 3rd digit. Pt. reports that she wore her scar pad, and edema sleeves. Pt. Presents with improved edema in the 3rd, and 4th digits. Scar is improving since last  week. Pt. Responded well to the retrograde massage to the 3rd, and 4th digits with a decrease in edema of .2 cm around the base of the 3rd digit, and .1 cm around the base of the 4th digit. Pt. Education was provided about  worked simplification strategies, and joint protection principles for the right hand for kitchen, and Problem solved through work related tasks handling a mop, vacuum.  Pt.'s Pain, edema, increased scar tissue, limited ROM and strength continue to interfere with her ability to perform daily tasks efficiently  Pt. continues to benefit from OT services to work on improving pain, scar management, ROM, strength, and coordination in order to be able to use her dominant right hand efficiently during daily ADL, IADLs, and work related tasks with less pain.                           OT Education - 09/25/21 1820     Education Details Scar massage, scar pad, edema control, work simplification strategies.    Person(s) Educated Patient    Methods Explanation    Comprehension Verbalized understanding              OT Short Term Goals - 09/16/21 1816       OT SHORT TERM GOAL #1   Title Pt. will demonstrate independence with HEP, for ther. ex, edema control,a nd scar management.    Baseline Eval: No current HEP    Time 3    Period Weeks    Status New    Target Date 10/07/21               OT Long Term Goals - 09/16/21 1817       OT LONG TERM GOAL #1   Title Pt. will improve FOTO score by 2 points for clinically relevent ADL/IADL improvement.    Baseline Eval: FOTO score: 50, TR score: 65    Time 6    Period Weeks    Status New    Target Date 10/28/21      OT LONG TERM GOAL #2   Title Pt. will improve right grip strength by 5# to be able to squeeze a shampoo bottle.    Baseline Eval: R: 17# L: 27# Pt. is unable to squeeze a shampoo bottle    Time 6    Period Weeks    Status New    Target Date 10/28/21      OT LONG TERM GOAL #3   Title Pt.  will improve right 3pt. pinch strength to be able to pull up compression hose    Baseline Eval: R: 7# L: 10# pt. has difficulty pulling up compression  hose.    Time 6    Period Weeks    Status New    Target Date 10/28/21      OT LONG TERM GOAL #4   Title Pt. will improve right palmar scar tissue to be able to achieve full 3rd digit MP extension without pain during daily tasks.    Baseline Eval: right 3rd digit MP extension -10    Time 6    Period Weeks    Status New    Target Date 10/28/21      OT LONG TERM GOAL #5   Title Pt. will independently hold and use a vacuum without pain.    Baseline Eval: pt. has difficulty holding,a nd using a vacuum cleaner.    Time 6    Period Weeks    Status New    Target Date 10/28/21      Long Term Additional Goals   Additional Long Term Goals Yes      OT LONG TERM GOAL #6   Title Pt. will improve right lateral pinch to be able to open bottles.    Baseline 09/16/2021: R: 10#, L: 11#    Time 12    Period Weeks    Status New    Target Date 10/28/21                   Plan - 09/25/21 1821     Clinical Impression Statement Upon arrival, pt. reported 4/10 pain in the palmar aspect at the base of the 3rd digit. Pt. reports that she wore her scar pad, and edema sleeves. Pt. Presents with improved edema in the 3rd, and 4th digits. Scar is improving since last week. Pt. Responded well to the retrograde massage to the 3rd, and 4th digits with a decrease in edema of .2 cm around the base of the 3rd digit, and .1 cm around the base of the 4th digit. Pt. Education was provided about  worked simplification strategies, and joint protection principles for the right hand for kitchen, and work related tasks.  Pt.'s Pain, edema, increased scar tissue, limited ROM and strength continue to interfere with her ability to perform daily tasks efficiently  Pt. continues to benefit from OT services to work on improving pain, scar management, ROM, strength, and  coordination in order to be able to use her dominant right hand efficiently during daily ADL, IADLs, and work related tasks with less pain.   OT Occupational Profile and History Detailed Assessment- Review of Records and additional review of physical, cognitive, psychosocial history related to current functional performance    Occupational performance deficits (Please refer to evaluation for details): ADL's;IADL's;Leisure;Work    Marketing executive / Function / Physical Skills ADL;ROM;Strength;FMC;Dexterity;IADL;Scar mobility;UE functional use;Coordination    Rehab Potential Good    Clinical Decision Making Several treatment options, min-mod task modification necessary    Comorbidities Affecting Occupational Performance: May have comorbidities impacting occupational performance    Modification or Assistance to Complete Evaluation  Min-Moderate modification of tasks or assist with assess necessary to complete eval    OT Frequency 2x / week    OT Duration 6 weeks    OT Treatment/Interventions Self-care/ADL training;Therapeutic exercise;Ultrasound;Moist Heat;Therapeutic activities;Patient/family education;Contrast Bath;DME and/or AE instruction;Paraffin;Manual Therapy;Scar mobilization;Splinting;Energy conservation    Consulted and Agree with Plan of Care Patient             Patient will benefit from skilled therapeutic intervention in order to improve the following deficits and impairments:   Body Structure /  Function / Physical Skills: ADL, ROM, Strength, FMC, Dexterity, IADL, Scar mobility, UE functional use, Coordination       Visit Diagnosis: Muscle weakness (generalized)    Problem List Patient Active Problem List   Diagnosis Date Noted   Acquired trigger finger of right middle finger 08/12/2021   Major depression, recurrent, full remission ([place]) 07/23/2020   Restless leg syndrome 04/20/2018   Leg pain 04/02/2018   Chronic venous insufficiency 04/02/2018   Diarrhea, functional  03/16/2017   Vitamin D deficiency 07/08/2016   Allergic rhinitis 02/12/2016   Overweight (BMI 25.0-29.9) 02/12/2016   RUQ abdominal pain 12/11/2015   Abdominal pain, LLQ (left lower quadrant) 12/11/2015   Type 2 diabetes mellitus with other specified complication ([place]) 25/83/4621   Hyperlipidemia associated with type 2 diabetes mellitus ([place]) 06/07/2015   Essential hypertension 06/07/2015   Anxiety 06/07/2015   Plantar fasciitis, bilateral     Harrel Carina, MS, OTR/L 09/25/2021, 6:23 PM  [place] [facility] SERVICES [place], [place], [address] Phone: [phone]   Fax:  [phone]  Name: Andrea Mcdonald MRN: [id number] Date of Birth: [birth year]

## 2021-10-01 ENCOUNTER — Ambulatory Visit: Payer: PRIVATE HEALTH INSURANCE | Attending: Specialist | Admitting: Occupational Therapy

## 2021-10-01 ENCOUNTER — Other Ambulatory Visit: Payer: Self-pay

## 2021-10-01 DIAGNOSIS — R278 Other lack of coordination: Secondary | ICD-10-CM | POA: Diagnosis present

## 2021-10-01 DIAGNOSIS — M6281 Muscle weakness (generalized): Secondary | ICD-10-CM | POA: Insufficient documentation

## 2021-10-02 NOTE — Therapy (Signed)
[place] MAIN [facility] SERVICES [address] [place], [place], [address] Phone: [phone]   Fax:  [phone]  Occupational Therapy Treatment  Patient Details  Name: Andrea Mcdonald MRN: [id number] Date of Birth: 08-09-1961 Referring Provider (OT): Dr.Howard Sabra Heck   Encounter Date: 10/01/2021   OT End of Session - 10/02/21 1908     Visit Number 5    Number of Visits 12    Date for OT Re-Evaluation 10/28/21    Authorization Type Progress report period starting 09/16/2021    OT Start Time 1520    OT Stop Time 1605    OT Time Calculation (min) 45 min    Activity Tolerance Patient tolerated treatment well    Behavior During Therapy [facility] for tasks assessed/performed             Past Medical History:  Diagnosis Date   Diabetes mellitus without complication ([place])    High cholesterol    Plantar fasciitis, bilateral 05/25/2013    Past Surgical History:  Procedure Laterality Date   AUGMENTATION MAMMAPLASTY     COLONOSCOPY WITH PROPOFOL N/A 09/14/2020   Procedure: COLONOSCOPY WITH PROPOFOL;  Surgeon: Jonathon Bellows, MD;  Location: [facility] ENDOSCOPY;  Service: Gastroenterology;  Laterality: N/A;   ESOPHAGOGASTRIC FUNDOPLICATION  7867   at [facility], for GERD   [place]     x2   PARTIAL HYSTERECTOMY     PLACEMENT OF BREAST IMPLANTS      There were no vitals filed for this visit.   Subjective Assessment - 10/02/21 1905     Subjective  Pt. reports arriving from work.    Patient is accompanied by: Family member    Pertinent History Pt. is a 60 y.o. female  who was was diagnosed with a Right 3rd digit trigger finger, and Dupuytrens. Pt. underwent a Trigger FInger Release on August 15th, 2022. Pt. has continued to have pain, edema, numbness, and tingling in the 3rd digit. Pt. presents with palmar scar tissue on the volar surface of the hand. Pt. has had a recent onset of triggering in the right 4th  digit. PMHx includes: DM, Right RTC repair, neck/back issues. Pt. works full-time in Actuary at [employer]. Pt. enjoys camping, fishing with her husband, and spending time with her grandchildren.    Limitations Dominant right hand strength, ROM    Patient Stated Goals To be able to lift heavier items.    Currently in Pain? Yes    Pain Score 4     Pain Location Hand    Pain Orientation Right    Pain Descriptors / Indicators Sore    Pain Type Chronic pain    Pain Onset More than a month ago            Pt. Tolerated a splint fabrication, and initial fitting assessment. A right 4th digit MP block trigger finger ring splint was fabricated, the position was assessed, and modified for proper fit, and alignment. The edges were further modified with mole skin provided, as well as adjustable straps. Pt. Was able to achieve 4th digit PIP flexion while blocking MP flexion.  Pt. Was provided with an edema glove for daytime wearing during work to help secure the palmar scar pad in place.  Pt. To wear the splint at night on the right 4th digit, along with the 3rd digit edema sleeve. Pt. Presents with improving edema from previous sessions,  however continues to have edema at the 3rd digit, and the region of the 3rd digit pully. Pt. Education was provided about proper splint care, application, removal, and recommended wearing schedule. Pt. Would benefit from a follow-up splint fit check at the next treatment session, along with the moleskin fit, and modifications as needed. Plan to review 3rd digit icing, and review of work simplification strategies for work, and home related tasks. Plan to have pt. Continue with contrast bath scar massage, and ROM.                    OT Education - 10/02/21 1907     Education Details Right 4th digit MP block trigger finger ring splint    Person(s) Educated Patient    Methods Explanation    Comprehension Verbalized understanding               OT Short Term Goals - 09/16/21 1816       OT SHORT TERM GOAL #1   Title Pt. will demonstrate independence with HEP, for ther. ex, edema control,a nd scar management.    Baseline Eval: No current HEP    Time 3    Period Weeks    Status New    Target Date 10/07/21               OT Long Term Goals - 09/16/21 1817       OT LONG TERM GOAL #1   Title Pt. will improve FOTO score by 2 points for clinically relevent ADL/IADL improvement.    Baseline Eval: FOTO score: 50, TR score: 65    Time 6    Period Weeks    Status New    Target Date 10/28/21      OT LONG TERM GOAL #2   Title Pt. will improve right grip strength by 5# to be able to squeeze a shampoo bottle.    Baseline Eval: R: 17# L: 27# Pt. is unable to squeeze a shampoo bottle    Time 6    Period Weeks    Status New    Target Date 10/28/21      OT LONG TERM GOAL #3   Title Pt. will improve right 3pt. pinch strength to be able to pull up compression hose    Baseline Eval: R: 7# L: 10# pt. has difficulty pulling up compression hose.    Time 6    Period Weeks    Status New    Target Date 10/28/21      OT LONG TERM GOAL #4   Title Pt. will improve right palmar scar tissue to be able to achieve full 3rd digit MP extension without pain during daily tasks.    Baseline Eval: right 3rd digit MP extension -10    Time 6    Period Weeks    Status New    Target Date 10/28/21      OT LONG TERM GOAL #5   Title Pt. will independently hold and use a vacuum without pain.    Baseline Eval: pt. has difficulty holding,a nd using a vacuum cleaner.    Time 6    Period Weeks    Status New    Target Date 10/28/21      Long Term Additional Goals   Additional Long Term Goals Yes      OT LONG TERM GOAL #6   Title Pt. will improve right lateral pinch to be able to open bottles.    Baseline  09/16/2021: R: 10#, L: 11#    Time 12    Period Weeks    Status New    Target Date 10/28/21                   Plan  - 10/02/21 1908     Clinical Impression Statement Pt. Was provided with an edema glove for daytime wearing during work to help secure the palmar scar pad in place.  Pt. To wear the splint at night on the right 4th digit, along with the 3rd digit edema sleeve. Pt. Presents with improving edema from previous sessions, however continues to have edema at the 3rd digit, and the region of the 3rd digit pully. Pt. Education was provided about proper splint care, application, removal, and recommended wearing schedule. Pt. Would benefit from a follow-up splint fit check at the next treatment session, along with the moleskin fit, and modifications as needed. Plan to review 3rd digit icing, and review of work simplification strategies for work, and home related tasks. Plan to have pt. Continue with contrast bath scar massage, and ROM.       OT Occupational Profile and History Detailed Assessment- Review of Records and additional review of physical, cognitive, psychosocial history related to current functional performance    Occupational performance deficits (Please refer to evaluation for details): ADL's;IADL's;Leisure;Work    Marketing executive / Function / Physical Skills ADL;ROM;Strength;FMC;Dexterity;IADL;Scar mobility;UE functional use;Coordination    Rehab Potential Good    Clinical Decision Making Several treatment options, min-mod task modification necessary    Comorbidities Affecting Occupational Performance: May have comorbidities impacting occupational performance    Modification or Assistance to Complete Evaluation  Min-Moderate modification of tasks or assist with assess necessary to complete eval    OT Frequency 2x / week    OT Duration 6 weeks    OT Treatment/Interventions Self-care/ADL training;Therapeutic exercise;Ultrasound;Moist Heat;Therapeutic activities;Patient/family education;Contrast Bath;DME and/or AE instruction;Paraffin;Manual Therapy;Scar mobilization;Splinting;Energy conservation     Consulted and Agree with Plan of Care Patient             Patient will benefit from skilled therapeutic intervention in order to improve the following deficits and impairments:   Body Structure / Function / Physical Skills: ADL, ROM, Strength, FMC, Dexterity, IADL, Scar mobility, UE functional use, Coordination       Visit Diagnosis: Muscle weakness (generalized)  Other lack of coordination    Problem List Patient Active Problem List   Diagnosis Date Noted   Acquired trigger finger of right middle finger 08/12/2021   Major depression, recurrent, full remission ([place]) 07/23/2020   Restless leg syndrome 04/20/2018   Leg pain 04/02/2018   Chronic venous insufficiency 04/02/2018   Diarrhea, functional 03/16/2017   Vitamin D deficiency 07/08/2016   Allergic rhinitis 02/12/2016   Overweight (BMI 25.0-29.9) 02/12/2016   RUQ abdominal pain 12/11/2015   Abdominal pain, LLQ (left lower quadrant) 12/11/2015   Type 2 diabetes mellitus with other specified complication ([place]) 40/98/1191   Hyperlipidemia associated with type 2 diabetes mellitus ([place]) 06/07/2015   Essential hypertension 06/07/2015   Anxiety 06/07/2015   Plantar fasciitis, bilateral     Harrel Carina, MS, OTR/L 10/02/2021, 7:15 PM  [place] [address] [place], [place], [address] Phone: [phone]   Fax:  [phone]  Name: SHAKERA EBRAHIMI MRN: [id number] Date of Birth: [birth year]

## 2021-10-03 ENCOUNTER — Other Ambulatory Visit: Payer: Self-pay

## 2021-10-03 ENCOUNTER — Ambulatory Visit: Payer: PRIVATE HEALTH INSURANCE | Admitting: Occupational Therapy

## 2021-10-03 DIAGNOSIS — M6281 Muscle weakness (generalized): Secondary | ICD-10-CM | POA: Diagnosis not present

## 2021-10-03 DIAGNOSIS — R278 Other lack of coordination: Secondary | ICD-10-CM

## 2021-10-03 NOTE — Therapy (Signed)
[place] MAIN [facility] SERVICES [address] [place], [place], [address] Phone: [phone]   Fax:  [phone]  Occupational Therapy Treatment  Patient Details  Name: Andrea Mcdonald MRN: [id number] Date of Birth: [birth year] Referring Provider (OT): Dr.Howard Sabra Heck   Encounter Date: 10/03/2021   OT End of Session - 10/03/21 1528     Visit Number 6    Number of Visits 12    Date for OT Re-Evaluation 10/28/21    Authorization Type Progress report period starting 09/16/2021    OT Start Time 1523    OT Stop Time 1601    OT Time Calculation (min) 38 min    Activity Tolerance Patient tolerated treatment well    Behavior During Therapy [facility] for tasks assessed/performed             Past Medical History:  Diagnosis Date   Diabetes mellitus without complication ([place])    High cholesterol    Plantar fasciitis, bilateral 05/25/2013    Past Surgical History:  Procedure Laterality Date   AUGMENTATION MAMMAPLASTY     COLONOSCOPY WITH PROPOFOL N/A 09/14/2020   Procedure: COLONOSCOPY WITH PROPOFOL;  Surgeon: Jonathon Bellows, MD;  Location: [facility] ENDOSCOPY;  Service: Gastroenterology;  Laterality: N/A;   ESOPHAGOGASTRIC FUNDOPLICATION  9678   at [facility], for GERD   [place]     x2   PARTIAL HYSTERECTOMY     PLACEMENT OF BREAST IMPLANTS      There were no vitals filed for this visit.   Subjective Assessment - 10/03/21 1527     Subjective  Pt reports increased pain from working today.    Patient is accompanied by: Family member    Pertinent History Pt. is a 60 y.o. female  who was was diagnosed with a Right 3rd digit trigger finger, and Dupuytrens. Pt. underwent a Trigger FInger Release on August 15th, 2022. Pt. has continued to have pain, edema, numbness, and tingling in the 3rd digit. Pt. presents with palmar scar tissue on the volar surface of the hand. Pt. has had a recent onset of triggering in  the right 4th digit. PMHx includes: DM, Right RTC repair, neck/back issues. Pt. works full-time in Actuary at [employer]. Pt. enjoys camping, fishing with her husband, and spending time with her grandchildren.    Limitations Dominant right hand strength, ROM    Patient Stated Goals To be able to lift heavier items.    Currently in Pain? Yes    Pain Score 5     Pain Location Hand    Pain Orientation Right    Pain Descriptors / Indicators Aching    Pain Type Chronic pain    Pain Onset More than a month ago               Contrast Bath:  Pt. Tolerated contrasting moist heat for 3 min. followed by 2 min. cold pack alternating for 3 reps each for edema control to the right hand in preparation for ROM, and manual therapy.   Therapeutic Exercise: Reviewed tendon glide exercises in preparation for fisting. Pt. Performed reps of Active Digit MP flexion with PIP extension. Cues were provided for form, and techniques with passive stretching to for 3rd digit extension.   Pt instructed in joint protection strategies for bottle opening, including use of gripping aid and Wbing through palm.    Manual Therapy:  Pt. tolerated soft tissue  massage for scar management at the palmar surface of the hand at the base of the 3rd, and 4th digits. Cross friction massage, and circular motion was performed. Pt. was able to return demonstration with consistent cues for form, and technique.               OT Education - 10/03/21 1528     Education Details Right 4th digit MP block trigger finger ring splint    Person(s) Educated Patient    Methods Explanation    Comprehension Verbalized understanding              OT Short Term Goals - 09/16/21 1816       OT SHORT TERM GOAL #1   Title Pt. will demonstrate independence with HEP, for ther. ex, edema control,a nd scar management.    Baseline Eval: No current HEP    Time 3    Period Weeks    Status New    Target Date  10/07/21               OT Long Term Goals - 09/16/21 1817       OT LONG TERM GOAL #1   Title Pt. will improve FOTO score by 2 points for clinically relevent ADL/IADL improvement.    Baseline Eval: FOTO score: 50, TR score: 65    Time 6    Period Weeks    Status New    Target Date 10/28/21      OT LONG TERM GOAL #2   Title Pt. will improve right grip strength by 5# to be able to squeeze a shampoo bottle.    Baseline Eval: R: 17# L: 27# Pt. is unable to squeeze a shampoo bottle    Time 6    Period Weeks    Status New    Target Date 10/28/21      OT LONG TERM GOAL #3   Title Pt. will improve right 3pt. pinch strength to be able to pull up compression hose    Baseline Eval: R: 7# L: 10# pt. has difficulty pulling up compression hose.    Time 6    Period Weeks    Status New    Target Date 10/28/21      OT LONG TERM GOAL #4   Title Pt. will improve right palmar scar tissue to be able to achieve full 3rd digit MP extension without pain during daily tasks.    Baseline Eval: right 3rd digit MP extension -10    Time 6    Period Weeks    Status New    Target Date 10/28/21      OT LONG TERM GOAL #5   Title Pt. will independently hold and use a vacuum without pain.    Baseline Eval: pt. has difficulty holding,a nd using a vacuum cleaner.    Time 6    Period Weeks    Status New    Target Date 10/28/21      Long Term Additional Goals   Additional Long Term Goals Yes      OT LONG TERM GOAL #6   Title Pt. will improve right lateral pinch to be able to open bottles.    Baseline 09/16/2021: R: 10#, L: 11#    Time 12    Period Weeks    Status New    Target Date 10/28/21                   Plan - 10/03/21 1529  Clinical Impression Statement Pt instructe din joint protection strategies for jar opening. Reports increased pain to 4th digit during therex. Pt did not bring splint for check.Pt reports improved tolerance of scar massage - no pain this date. Plan to  have pt. Continue with contrast bath scar massage, and ROM.    OT Occupational Profile and History Detailed Assessment- Review of Records and additional review of physical, cognitive, psychosocial history related to current functional performance    Occupational performance deficits (Please refer to evaluation for details): ADL's;IADL's;Leisure;Work    Marketing executive / Function / Physical Skills ADL;ROM;Strength;FMC;Dexterity;IADL;Scar mobility;UE functional use;Coordination    Rehab Potential Good    Clinical Decision Making Several treatment options, min-mod task modification necessary    Comorbidities Affecting Occupational Performance: May have comorbidities impacting occupational performance    Modification or Assistance to Complete Evaluation  Min-Moderate modification of tasks or assist with assess necessary to complete eval    OT Frequency 2x / week    OT Duration 6 weeks    OT Treatment/Interventions Self-care/ADL training;Therapeutic exercise;Ultrasound;Moist Heat;Therapeutic activities;Patient/family education;Contrast Bath;DME and/or AE instruction;Paraffin;Manual Therapy;Scar mobilization;Splinting;Energy conservation    Consulted and Agree with Plan of Care Patient             Patient will benefit from skilled therapeutic intervention in order to improve the following deficits and impairments:   Body Structure / Function / Physical Skills: ADL, ROM, Strength, FMC, Dexterity, IADL, Scar mobility, UE functional use, Coordination       Visit Diagnosis: Muscle weakness (generalized)  Other lack of coordination    Problem List Patient Active Problem List   Diagnosis Date Noted   Acquired trigger finger of right middle finger 08/12/2021   Major depression, recurrent, full remission ([place]) 07/23/2020   Restless leg syndrome 04/20/2018   Leg pain 04/02/2018   Chronic venous insufficiency 04/02/2018   Diarrhea, functional 03/16/2017   Vitamin D deficiency 07/08/2016    Allergic rhinitis 02/12/2016   Overweight (BMI 25.0-29.9) 02/12/2016   RUQ abdominal pain 12/11/2015   Abdominal pain, LLQ (left lower quadrant) 12/11/2015   Type 2 diabetes mellitus with other specified complication ([place]) 16/07/9603   Hyperlipidemia associated with type 2 diabetes mellitus ([place]) 06/07/2015   Essential hypertension 06/07/2015   Anxiety 06/07/2015   Plantar fasciitis, bilateral     Dessie Coma, M.S. OTR/L  10/03/21, 4:05 PM  ascom [phone]   [place] [address] [place], [place], [address] Phone: [phone]   Fax:  [phone]  Name: Andrea Mcdonald MRN: [id number] Date of Birth: 1961/02/28

## 2021-10-07 ENCOUNTER — Ambulatory Visit: Payer: PRIVATE HEALTH INSURANCE | Admitting: Occupational Therapy

## 2021-10-07 ENCOUNTER — Other Ambulatory Visit: Payer: Self-pay

## 2021-10-07 DIAGNOSIS — M6281 Muscle weakness (generalized): Secondary | ICD-10-CM

## 2021-10-07 NOTE — Therapy (Signed)
[place] MAIN [facility] SERVICES [address] [place], [place], [address] Phone: [phone]   Fax:  [phone]  Patient Details  Name: Andrea Mcdonald MRN: [id number] Date of Birth: 12/16/1960 Referring Provider:  Earnestine Leys, MD  Encounter Date: 10/07/2021  Pt. Arrived late for the session, after coming from her follow-up orthopedic physician appointment for her hand. Pt. received 2 cortizone shots in her right hand this afternoon just prior to this appointment. Will hold on OT treatment this afternoon, and reassess pt. at her next appointment scheduled for this Thursday. Pt. Education was provided about icing at home. Recommend holding off on splint splint, scar pad, scar massage, and ROM until next visit due to Cortizone injections, pain, and bleeding at the injection sites.   Harrel Carina, MS, OTR/L 10/07/2021, 5:34 PM  [place] MAIN [facility] SERVICES [address] [place], [place], [address] Phone: [phone]   Fax:  [phone]

## 2021-10-09 ENCOUNTER — Ambulatory Visit (INDEPENDENT_AMBULATORY_CARE_PROVIDER_SITE_OTHER): Payer: No Typology Code available for payment source | Admitting: Urology

## 2021-10-09 ENCOUNTER — Ambulatory Visit
Admission: RE | Admit: 2021-10-09 | Discharge: 2021-10-09 | Disposition: A | Discharge status: Home / Self Care | Payer: PRIVATE HEALTH INSURANCE | Attending: Urology | Admitting: Urology

## 2021-10-09 ENCOUNTER — Encounter: Payer: Self-pay | Admitting: Urology

## 2021-10-09 ENCOUNTER — Encounter: Payer: PRIVATE HEALTH INSURANCE | Admitting: Occupational Therapy

## 2021-10-09 ENCOUNTER — Ambulatory Visit
Admission: RE | Admit: 2021-10-09 | Discharge: 2021-10-09 | Disposition: A | Discharge status: Home / Self Care | Payer: PRIVATE HEALTH INSURANCE | Source: Ambulatory Visit | Attending: Urology | Admitting: Urology

## 2021-10-09 ENCOUNTER — Other Ambulatory Visit: Payer: Self-pay

## 2021-10-09 VITALS — BP 102/68 | HR 92 | Ht 66.0 in | Wt 175.0 lb

## 2021-10-09 DIAGNOSIS — N200 Calculus of kidney: Secondary | ICD-10-CM

## 2021-10-09 DIAGNOSIS — R310 Gross hematuria: Secondary | ICD-10-CM

## 2021-10-09 DIAGNOSIS — R109 Unspecified abdominal pain: Secondary | ICD-10-CM

## 2021-10-09 DIAGNOSIS — Z87442 Personal history of urinary calculi: Secondary | ICD-10-CM

## 2021-10-09 NOTE — Progress Notes (Signed)
10/09/2021 3:21 PM   [name] 1961-05-19 [id number]  Referring provider: Olin Hauser, DO [address] [place],  [place] [address]  Chief Complaint  Patient presents with   Hematuria    HPI: 60 y.o. female presents for evaluation of gross hematuria.  Telephone visit with Dr. Parks Ranger 09/11/2021 with complaints of gross hematuria and suprapubic pressure with urinary frequency Dipstick urinalysis 3+ blood; no microscopy ordered Started empirically on antibiotics Urine culture was negative Since that time has had 2 additional episodes gross hematuria Does have urinary frequency, urgency and left low back/side pain No fever chills or gross hematuria Prior history of stone disease treated with SWL and ureteroscopy   PMH: Past Medical History:  Diagnosis Date   Diabetes mellitus without complication ([place])    High cholesterol    Plantar fasciitis, bilateral 05/25/2013    Surgical History: Past Surgical History:  Procedure Laterality Date   AUGMENTATION MAMMAPLASTY     COLONOSCOPY WITH PROPOFOL N/A 09/14/2020   Procedure: COLONOSCOPY WITH PROPOFOL;  Surgeon: Jonathon Bellows, MD;  Location: [facility] ENDOSCOPY;  Service: Gastroenterology;  Laterality: N/A;   ESOPHAGOGASTRIC FUNDOPLICATION  6606   at [facility], for GERD   KIDNEY STONE SURGERY     LITHOTRIPSY     NECK SURGERY     x2   PARTIAL HYSTERECTOMY     PLACEMENT OF BREAST IMPLANTS      Home Medications:  Allergies as of 10/09/2021       Reactions   Codeine Itching        Medication List        Accurate as of October 09, 2021  3:21 PM. If you have any questions, ask your nurse or doctor.          STOP taking these medications    cephALEXin 500 MG capsule Commonly known as: KEFLEX Stopped by: Abbie Sons, MD       TAKE these medications    albuterol 108 (90 Base) MCG/ACT inhaler Commonly known as: VENTOLIN HFA Inhale 2 puffs into the lungs every 4 (four) hours as needed for  wheezing or shortness of breath (cough).   aspirin 81 MG chewable tablet Chew 81 mg by mouth at bedtime.   EQ Loratadine 10 MG tablet Generic drug: loratadine Take 1 tablet by mouth once daily   fluticasone 50 MCG/ACT nasal spray Commonly known as: FLONASE Use 2 spray(s) in each nostril once daily   lisinopril 5 MG tablet Commonly known as: ZESTRIL Take 1 tablet (5 mg total) by mouth daily.   meloxicam 15 MG tablet Commonly known as: MOBIC Take 15 mg by mouth daily.   metFORMIN 500 MG 24 hr tablet Commonly known as: GLUCOPHAGE-XR Take 1 tablet (500 mg total) by mouth 2 (two) times daily with a meal.   ondansetron 4 MG disintegrating tablet Commonly known as: Zofran ODT Take 1 tablet (4 mg total) by mouth every 8 (eight) hours as needed for nausea or vomiting.   Pazeo 0.7 % Soln Generic drug: Olopatadine HCl   rosuvastatin 20 MG tablet Commonly known as: CRESTOR Take 1 tablet (20 mg total) by mouth at bedtime.   sertraline 100 MG tablet Commonly known as: ZOLOFT Take 1.5 tablets (150 mg total) by mouth at bedtime.   Trulicity 1.5 TK/1.6WF Sopn Generic drug: Dulaglutide Inject 1.5 mg into the skin once a week.        Allergies:  Allergies  Allergen Reactions   Codeine Itching    Family History:  Family History  Problem Relation Age of Onset   Heart disease Mother    Depression Mother    Anxiety disorder Mother    Cancer Mother        retinal   Breast cancer Neg Hx     Social History:  reports that she has never smoked. She has never used smokeless tobacco. She reports current alcohol use. She reports that she does not use drugs.   Physical Exam: BP 102/68    Pulse 92    Ht 5\' 6"  (1.676 m)    Wt 175 lb (79.4 kg)    BMI 28.25 kg/m   Constitutional:  Alert and oriented, No acute distress. HEENT: [place] AT, moist mucus membranes.  Trachea midline, no masses. Cardiovascular: No clubbing, cyanosis, or edema. Respiratory: Normal respiratory effort, no  increased work of breathing. Neurologic: Grossly intact, no focal deficits, moving all 4 extremities. Psychiatric: Normal mood and affect.  Laboratory Data:  Urinalysis Dipstick 3+ blood/trace leukocytes Microscopy 6-10 WBC, >30 RBC   Assessment & Plan:    1. Gross hematuria AUA hematuria risk ratification: High Associated with symptoms of bladder pressure, urgency and left side pain; prior history of recurrent stone disease KUB ordered and if no obvious stone we will proceed with CT urogram and cystoscopy  2.  Urinary urgency  3.  Personal history urinary calculi  4.  Left flank pain   Abbie Sons, MD  [facility] [address], [place], [place] [address] [phone]

## 2021-10-10 ENCOUNTER — Encounter: Payer: Self-pay | Admitting: Urology

## 2021-10-10 ENCOUNTER — Ambulatory Visit: Payer: PRIVATE HEALTH INSURANCE | Admitting: Occupational Therapy

## 2021-10-10 LAB — URINALYSIS, COMPLETE
Bilirubin, UA: NEGATIVE
Glucose, UA: NEGATIVE
Nitrite, UA: NEGATIVE
Specific Gravity, UA: >1.03 — ABNORMAL HIGH (ref 1.005–1.030)
Urobilinogen, Ur: 0.2 mg/dL (ref 0.2–1.0)
pH, UA: 5.5 (ref 5.0–7.5)

## 2021-10-10 LAB — MICROSCOPIC EXAMINATION

## 2021-10-11 ENCOUNTER — Telehealth: Payer: Self-pay | Admitting: Urology

## 2021-10-11 NOTE — Telephone Encounter (Signed)
What specific MyChart results is she referring to?  Her KUB results have not been read by radiology and therefore should not be available on my chart.    I did not see an obvious stone on KUB and she will need to be scheduled for a CT urogram.

## 2021-10-11 NOTE — Telephone Encounter (Signed)
Pt saw her diagnosis in MyChart (stones) and would like to know what the next steps are. She would like to know if whatever steps are going to be taken in regards to her stone(s), as much be done as possible before the end of the year as she has met her deductible. She would like a call back today if possible.

## 2021-10-14 ENCOUNTER — Encounter: Payer: Self-pay | Admitting: Occupational Therapy

## 2021-10-14 ENCOUNTER — Encounter: Payer: Self-pay | Admitting: *Deleted

## 2021-10-14 ENCOUNTER — Ambulatory Visit: Payer: PRIVATE HEALTH INSURANCE | Admitting: Occupational Therapy

## 2021-10-14 NOTE — Therapy (Signed)
[place] MAIN [facility] SERVICES [address] [place], [place], [address] Phone: [phone]   Fax:  [phone]  October 14, 2021    No Recipients  Occupational Therapy Discharge Summary   Patient: Andrea Mcdonald MRN: [id number] Date of Birth: 04/08/1961  Diagnosis: No diagnosis found.  Referring Provider (OT): Dr.Howard Sabra Heck   The above patient had been seen in Occupational Therapy 6 sessions.  The treatment consisted of Edema control, scar massage, ROM, splinting, and pt. Education about work Social worker. The patient is: Improved  Subjective:   Pt. had two palmar injections at her last MD visit.  Following her last MD visit, pt. reported that her physician is no longer a provider in her insurance network. Pt. Reported that she will be finding another physician in the new year. Pt.'s OT certification ends in 2 weeks, at the beginning of the new year. Discussed with the pt. that In order to proceed with therapy at that time, it is required for the new physician to approve, and sign the POC/recertification. Pt. Called this week to cancel her remaining OT appointment appointments at this time.   Discharge Findings:   Edema fluctuated. Pt. Was able to demonstrate independence with edema control techniques, scar massage, splint care, and ROM. Pt. Education was provided about work simplification strategies for home, and work related tasks.    Functional Status at Discharge:    OT Long Term Goals - 09/16/21 1817       OT LONG TERM GOAL #1   Title Pt. will improve FOTO score by 2 points for clinically relevent ADL/IADL improvement.    Baseline Eval: FOTO score: 50, TR score: 65    Time 6    Period Weeks    Status Not met   Target Date 10/28/21      OT LONG TERM GOAL #2   Title Pt. will improve right grip strength by 5# to be able to squeeze a shampoo bottle.    Baseline Eval: R: 17# L: 27# Pt. is unable to squeeze a  shampoo bottle    Time 6    Period Weeks    Status Deferred   Target Date 10/28/21      OT LONG TERM GOAL #3   Title Pt. will improve right 3pt. pinch strength to be able to pull up compression hose    Baseline Eval: R: 7# L: 10# pt. has difficulty pulling up compression hose.    Time 6    Period Weeks    Status Deferred   Target Date 10/28/21      OT LONG TERM GOAL #4   Title Pt. will improve right palmar scar tissue to be able to achieve full 3rd digit MP extension without pain during daily tasks.    Baseline Eval: right 3rd digit MP extension -10    Time 6    Period Weeks    Status Partially met   Target Date 10/28/21      OT LONG TERM GOAL #5   Title Pt. will independently hold and use a vacuum without pain.    Baseline Eval: pt. has difficulty holding,a nd using a vacuum cleaner.    Time 6    Period Weeks    Status Deferred   Target Date 10/28/21      Long Term Additional Goals   Additional Long Term Goals Yes      OT LONG TERM GOAL #6   Title Pt. will improve right lateral  pinch to be able to open bottles.    Baseline 09/16/2021: R: 10#, L: 11#    Time 12    Period Weeks    Status Deferred   Target Date 10/28/21              Goals Partially Met    Sincerely,  Harrel Carina, MS, OTR/L  CC No Recipients  [place] MAIN [facility] SERVICES [address] [place], [place], [address] Phone: [phone]   Fax:  [phone]  Patient: Andrea Mcdonald MRN: [id number] Date of Birth: 02/03/1961

## 2021-10-17 ENCOUNTER — Ambulatory Visit: Payer: PRIVATE HEALTH INSURANCE | Admitting: Occupational Therapy

## 2021-10-22 ENCOUNTER — Ambulatory Visit: Payer: PRIVATE HEALTH INSURANCE | Admitting: Occupational Therapy

## 2021-10-23 ENCOUNTER — Encounter (HOSPITAL_BASED_OUTPATIENT_CLINIC_OR_DEPARTMENT_OTHER): Payer: Self-pay

## 2021-10-23 ENCOUNTER — Ambulatory Visit (HOSPITAL_BASED_OUTPATIENT_CLINIC_OR_DEPARTMENT_OTHER)
Admission: RE | Admit: 2021-10-23 | Discharge: 2021-10-23 | Disposition: A | Discharge status: Home / Self Care | Payer: PRIVATE HEALTH INSURANCE | Source: Ambulatory Visit | Attending: Urology | Admitting: Urology

## 2021-10-23 ENCOUNTER — Other Ambulatory Visit: Payer: Self-pay

## 2021-10-23 DIAGNOSIS — R310 Gross hematuria: Secondary | ICD-10-CM | POA: Insufficient documentation

## 2021-10-23 DIAGNOSIS — R109 Unspecified abdominal pain: Secondary | ICD-10-CM | POA: Diagnosis present

## 2021-10-23 DIAGNOSIS — Z87442 Personal history of urinary calculi: Secondary | ICD-10-CM | POA: Insufficient documentation

## 2021-10-23 MED ORDER — IOHEXOL 300 MG/ML  SOLN
100.0000 mL | Freq: Once | INTRAMUSCULAR | Status: AC | PRN
  Administered 2021-10-23: 16:00:00 100 mL via INTRAVENOUS

## 2021-10-24 ENCOUNTER — Ambulatory Visit: Payer: PRIVATE HEALTH INSURANCE | Admitting: Occupational Therapy

## 2021-10-25 ENCOUNTER — Telehealth: Payer: Self-pay | Admitting: Family Medicine

## 2021-10-25 NOTE — Telephone Encounter (Signed)
LMOM for patient to return call. She will need an appointment for Cysto

## 2021-10-25 NOTE — Telephone Encounter (Signed)
-----   Message from Abbie Sons, MD sent at 10/24/2021  6:29 PM EST ----- CT showed no findings that would explain her gross hematuria.  I had recommended cystoscopy but it does not look like it has been scheduled.  Please schedule cystoscopy

## 2021-10-30 NOTE — Telephone Encounter (Signed)
Pt returned call and cysto was scheduled.

## 2021-11-04 ENCOUNTER — Encounter: Payer: Self-pay | Admitting: Urology

## 2021-11-04 ENCOUNTER — Other Ambulatory Visit: Payer: Self-pay

## 2021-11-04 ENCOUNTER — Ambulatory Visit (INDEPENDENT_AMBULATORY_CARE_PROVIDER_SITE_OTHER): Payer: BC Managed Care – PPO | Admitting: Urology

## 2021-11-04 VITALS — BP 99/64 | HR 1 | Ht 65.0 in | Wt 175.0 lb

## 2021-11-04 DIAGNOSIS — R110 Nausea: Secondary | ICD-10-CM | POA: Diagnosis not present

## 2021-11-04 DIAGNOSIS — R310 Gross hematuria: Secondary | ICD-10-CM

## 2021-11-04 DIAGNOSIS — M549 Dorsalgia, unspecified: Secondary | ICD-10-CM | POA: Diagnosis not present

## 2021-11-04 LAB — POCT I-STAT CREATININE: Creatinine, Ser: 0.8 mg/dL (ref 0.44–1.00)

## 2021-11-04 MED ORDER — PROMETHAZINE HCL 25 MG PO TABS: 25.0000 mg | ORAL_TABLET | Freq: Four times a day (QID) | ORAL | 0 refills | Status: DC | PRN

## 2021-11-04 NOTE — H&P (View-Only) (Signed)
° °  11/04/21  CC:  Chief Complaint  Patient presents with   Cysto    HPI: History intermittent gross hematuria.  Complains of bilateral back pain and nausea.  CTU with a punctate nonobstructing lower pole calculus.  UA today >30 RBC  Blood pressure 99/64, pulse (!) 1, height 5\' 5"  (1.651 m), weight 175 lb (79.4 kg). NED. A&Ox3.   No respiratory distress   Abd soft, NT, ND Normal external genitalia with patent urethral meatus  Cystoscopy Procedure Note  Patient identification was confirmed, informed consent was obtained, and patient was prepped using Betadine solution.  Lidocaine jelly was administered per urethral meatus.    Procedure: - Flexible cystoscope introduced, without any difficulty.   - Thorough search of the bladder revealed:    normal urethral meatus    normal urothelium    no stones    no ulcers     no tumors    no urethral polyps    no trabeculation  - Ureteral orifices were normal in position and appearance with clear efflux.  Post-Procedure: - Patient tolerated the procedure well  Assessment/ Plan: No source of her hematuria identified UA today with significant microhematuria Urine cytology sent The ureters were not completely opacified on CT urogram Discussed cystoscopy with bilateral retrograde pyelograms and possible ureteroscopy and she desires to schedule The procedure was discussed including potential risks of bleeding, infection and rarely ureteral injury.  Possible need for stent placement was also discussed. Based on her CT urogram do not see a urologic source of her back pain and nausea.  She did request an Rx Phenergan which was sent to her pharmacy   Abbie Sons, MD

## 2021-11-04 NOTE — Progress Notes (Signed)
° °  11/04/21  CC:  Chief Complaint  Patient presents with   Cysto    HPI: History intermittent gross hematuria.  Complains of bilateral back pain and nausea.  CTU with a punctate nonobstructing lower pole calculus.  UA today >30 RBC  Blood pressure 99/64, pulse (!) 1, height 5\' 5"  (1.651 m), weight 175 lb (79.4 kg). NED. A&Ox3.   No respiratory distress   Abd soft, NT, ND Normal external genitalia with patent urethral meatus  Cystoscopy Procedure Note  Patient identification was confirmed, informed consent was obtained, and patient was prepped using Betadine solution.  Lidocaine jelly was administered per urethral meatus.    Procedure: - Flexible cystoscope introduced, without any difficulty.   - Thorough search of the bladder revealed:    normal urethral meatus    normal urothelium    no stones    no ulcers     no tumors    no urethral polyps    no trabeculation  - Ureteral orifices were normal in position and appearance with clear efflux.  Post-Procedure: - Patient tolerated the procedure well  Assessment/ Plan: No source of her hematuria identified UA today with significant microhematuria Urine cytology sent The ureters were not completely opacified on CT urogram Discussed cystoscopy with bilateral retrograde pyelograms and possible ureteroscopy and she desires to schedule The procedure was discussed including potential risks of bleeding, infection and rarely ureteral injury.  Possible need for stent placement was also discussed. Based on her CT urogram do not see a urologic source of her back pain and nausea.  She did request an Rx Phenergan which was sent to her pharmacy   Abbie Sons, MD

## 2021-11-06 LAB — CYTOLOGY - NON PAP

## 2021-11-07 ENCOUNTER — Telehealth: Payer: Self-pay

## 2021-11-07 NOTE — Telephone Encounter (Signed)
Patient called  looking for cytology results.

## 2021-11-07 NOTE — Telephone Encounter (Signed)
Cytology showed no abnormal cells

## 2021-11-08 ENCOUNTER — Encounter: Payer: Self-pay | Admitting: *Deleted

## 2021-11-08 LAB — URINALYSIS, COMPLETE
Bilirubin, UA: NEGATIVE
Leukocytes,UA: NEGATIVE
Nitrite, UA: NEGATIVE
Specific Gravity, UA: >1.03 — ABNORMAL HIGH (ref 1.005–1.030)
Urobilinogen, Ur: 0.2 mg/dL (ref 0.2–1.0)
pH, UA: 5.5 (ref 5.0–7.5)

## 2021-11-08 LAB — MICROSCOPIC EXAMINATION
Bacteria, UA: NONE SEEN
RBC, Urine: >30 /hpf — AB (ref 0–2)

## 2021-11-09 ENCOUNTER — Encounter: Payer: Self-pay | Admitting: Urology

## 2021-11-09 ENCOUNTER — Other Ambulatory Visit: Payer: Self-pay | Admitting: Urology

## 2021-11-09 DIAGNOSIS — R310 Gross hematuria: Secondary | ICD-10-CM

## 2021-11-09 NOTE — Progress Notes (Signed)
Surgical Physician Order Form [facility] Urology [place]  * Scheduling expectation : Next Available  *Length of Case: 45 min  *Clearance needed: no  *Anticoagulation Instructions: N/A  *Aspirin Instructions: Hold Aspirin  *Post-op visit Date/Instructions:  1-2 week follow up  *Diagnosis:  hematuria  *Procedure: bilateral  retrograde pyelogram; poss bilat ureteroscopy; poss bilateral ureteral stent placement   Additional orders: N/A  -Admit type: OUTpatient  -Anesthesia: Spinal  -VTE Prophylaxis Standing Order SCDs       Other:   -Standing Lab Orders Per Anesthesia    Lab other: UA&Urine Culture  -Standing Test orders EKG/Chest x-ray per Anesthesia       Test other:   - Medications:  Diflucan 100mg  IV  -Other orders:  N/A

## 2021-11-11 ENCOUNTER — Telehealth: Payer: Self-pay

## 2021-11-11 ENCOUNTER — Other Ambulatory Visit: Payer: Self-pay

## 2021-11-11 ENCOUNTER — Other Ambulatory Visit: Payer: BC Managed Care – PPO

## 2021-11-11 DIAGNOSIS — R310 Gross hematuria: Secondary | ICD-10-CM

## 2021-11-11 NOTE — Telephone Encounter (Signed)
Spoke to patient and informed her the cytology showed no abnormal cells. A surgery scheduling form was filled out and I forwarded the call to [facility] to give patient the next steps on scheduling the surgery.

## 2021-11-11 NOTE — Telephone Encounter (Signed)
Pt LM on triage line stating that she would like to know the next steps in terms of her urologic care. She states that no one has called her to go over her lab results, in addition no one has contacted her in regards to scheduling her procedure. She requests a call back.

## 2021-11-11 NOTE — Addendum Note (Signed)
Addended by: Gerald Leitz A on: 11/11/2021 11:27 AM   Modules accepted: Orders

## 2021-11-11 NOTE — Telephone Encounter (Signed)
I spoke with Andrea Mcdonald. We have discussed possible surgery dates and Tuesday 11/26/2021 was agreed upon by all parties. Patient given information about surgery date, what to expect pre-operatively and post operatively.   We discussed that a Pre-Admission Testing office will be calling to set up the pre-op visit that will take place prior to surgery, and that these appointments are typically done over the phone with a Pre-Admissions RN.   Informed patient that our office will communicate any additional care to be provided after surgery. Patients questions or concerns were discussed during our call. Advised to call our office should there be any additional information, questions or concerns that arise. Patient verbalized understanding.

## 2021-11-11 NOTE — Progress Notes (Signed)
Patient ID: Andrea Mcdonald, female   DOB: [birth year], 61 y.o.   MRN: [id number]   [facility] Urological Surgery Posting Form   Surgery Date/Time: Date: 11/26/2021  Surgeon: Dr. John Giovanni, MD  Surgery Location: Day Surgery  Inpt ( No  )   Outpt (Yes)   Obs ( No  )   Diagnosis: Gross Hematuria R31.0  -CPT: 01100  Surgery: Cystoscopy with bilateral retrograde pyelograms  -CPT: possible 52351, possible 52332  Surgery: Possible bilateral ureteroscopy and possible bilateral ureteral stent placement.   Stop Anticoagulations: Yes, Hold ASA  Cardiac/Medical/Pulmonary Clearance needed: no  *Orders entered into EPIC  Date: 11/11/21   *Case booked in EPIC  Date: 11/11/21  *Notified pt of Surgery: Date: 11/11/21  PRE-OP UA & CX: Yes, will obtain today 11/11/2021  *Placed into Prior Authorization Work Fabio Bering Date: 11/11/21   Assistant/laser/rep:No

## 2021-11-13 LAB — CULTURE, URINE COMPREHENSIVE

## 2021-11-18 ENCOUNTER — Other Ambulatory Visit: Payer: Self-pay

## 2021-11-18 ENCOUNTER — Encounter
Admission: RE | Admit: 2021-11-18 | Discharge: 2021-11-18 | Disposition: A | Discharge status: Home / Self Care | Payer: PRIVATE HEALTH INSURANCE | Source: Ambulatory Visit | Attending: Urology | Admitting: Urology

## 2021-11-18 HISTORY — DX: Anxiety disorder, unspecified: F41.9

## 2021-11-18 HISTORY — DX: Vitamin D deficiency, unspecified: E55.9

## 2021-11-18 HISTORY — DX: Type 2 diabetes mellitus without complications: E11.9

## 2021-11-18 HISTORY — DX: Venous insufficiency (chronic) (peripheral): I87.2

## 2021-11-18 HISTORY — DX: Unspecified osteoarthritis, unspecified site: M19.90

## 2021-11-18 HISTORY — DX: Depression, unspecified: F32.A

## 2021-11-18 HISTORY — DX: Personal history of urinary calculi: Z87.442

## 2021-11-18 HISTORY — DX: Restless legs syndrome: G25.81

## 2021-11-18 NOTE — Patient Instructions (Signed)
Your procedure is scheduled on:11-26-21 Tuesday Report to the Registration Desk on the 1st floor of the [place].Then proceed to the 2nd floor Surgery Desk in the [place] To find out your arrival time, please call [phone] between 1PM - 3PM on:11-25-21 Monday  REMEMBER: Instructions that are not followed completely may result in serious medical risk, up to and including death; or upon the discretion of your surgeon and anesthesiologist your surgery may need to be rescheduled.  Do not eat food OR drink any liquids after midnight the night before surgery.  No gum chewing, lozengers or hard candies.  Do NOT take any medication the day of surgery  Stop your metFORMIN (GLUCOPHAGE-XR) 2 days prior to surgery-Last dose on 11-23-21 Saturday  Bring your albuterol (VENTOLIN HFA) inhaler to the hospital the day of surgery  One week prior to surgery: Stop Anti-inflammatories (NSAIDS) such as Advil, Aleve, Ibuprofen, Motrin, Naproxen, Naprosyn and Aspirin based products such as Excedrin, Goodys Powder, BC Powder.You may however, take Tylenol if needed for pain up until the day of surgery.  Stop ANY OVER THE COUNTER Supplements/Vitamins NOW (11-18-21) until after surgery (Omega-3 Fatty Acids (FISH OIL)  No Alcohol for 24 hours before or after surgery.  No Smoking including e-cigarettes for 24 hours prior to surgery.  No chewable tobacco products for at least 6 hours prior to surgery.  No nicotine patches on the day of surgery.  Do not use any "recreational" drugs for at least a week prior to your surgery.  Please be advised that the combination of cocaine and anesthesia may have negative outcomes, up to and including death. If you test positive for cocaine, your surgery will be cancelled.  On the morning of surgery brush your teeth with toothpaste and water, you may rinse your mouth with mouthwash if you wish. Do not swallow any toothpaste or mouthwash.  Do not wear jewelry, make-up,  hairpins, clips or nail polish.  Do not wear lotions, powders, or perfumes.   Do not shave body from the neck down 48 hours prior to surgery just in case you cut yourself which could leave a site for infection.  Also, freshly shaved skin may become irritated if using the CHG soap.  Contact lenses, hearing aids and dentures may not be worn into surgery.  Do not bring valuables to the hospital. [facility] is not responsible for any missing/lost belongings or valuables.   Notify your doctor if there is any change in your medical condition (cold, fever, infection).  Wear comfortable clothing (specific to your surgery type) to the hospital.  After surgery, you can help prevent lung complications by doing breathing exercises.  Take deep breaths and cough every 1-2 hours. Your doctor may order a device called an Incentive Spirometer to help you take deep breaths. When coughing or sneezing, hold a pillow firmly against your incision with both hands. This is called splinting. Doing this helps protect your incision. It also decreases belly discomfort.  If you are being admitted to the hospital overnight, leave your suitcase in the car. After surgery it may be brought to your room.  If you are being discharged the day of surgery, you will not be allowed to drive home. You will need a responsible adult (18 years or older) to drive you home and stay with you that night.   If you are taking public transportation, you will need to have a responsible adult (18 years or older) with you. Please confirm with your physician  that it is acceptable to use public transportation.   Please call the [place] Dept. at [phone] if you have any questions about these instructions.  Surgery Visitation Policy:  Patients undergoing a surgery or procedure may have one family member or support person with them as long as that person is not COVID-19 positive or experiencing its symptoms.  That  person may remain in the waiting area during the procedure and may rotate out with other people.  Inpatient Visitation:    Visiting hours are 7 a.m. to 8 p.m. Up to two visitors ages 16+ are allowed at one time in a patient room. The visitors may rotate out with other people during the day. Visitors must check out when they leave, or other visitors will not be allowed. One designated support person may remain overnight. The visitor must pass COVID-19 screenings, use hand sanitizer when entering and exiting the patients room and wear a mask at all times, including in the patients room. Patients must also wear a mask when staff or their visitor are in the room. Masking is required regardless of vaccination status.

## 2021-11-19 ENCOUNTER — Encounter
Admission: RE | Admit: 2021-11-19 | Discharge: 2021-11-19 | Disposition: A | Discharge status: Home / Self Care | Payer: BC Managed Care – PPO | Source: Ambulatory Visit | Attending: Vascular Surgery | Admitting: Vascular Surgery

## 2021-11-19 DIAGNOSIS — Z01810 Encounter for preprocedural cardiovascular examination: Secondary | ICD-10-CM | POA: Diagnosis not present

## 2021-11-19 DIAGNOSIS — E119 Type 2 diabetes mellitus without complications: Secondary | ICD-10-CM | POA: Insufficient documentation

## 2021-11-19 DIAGNOSIS — I10 Essential (primary) hypertension: Secondary | ICD-10-CM | POA: Insufficient documentation

## 2021-11-26 ENCOUNTER — Ambulatory Visit: Payer: BC Managed Care – PPO

## 2021-11-26 ENCOUNTER — Other Ambulatory Visit: Payer: Self-pay

## 2021-11-26 ENCOUNTER — Ambulatory Visit: Payer: BC Managed Care – PPO | Admitting: Anesthesiology

## 2021-11-26 ENCOUNTER — Encounter
Admission: RE | Disposition: A | Discharge status: Home / Self Care | Payer: Self-pay | Source: Home / Self Care | Attending: Urology

## 2021-11-26 ENCOUNTER — Encounter: Payer: Self-pay | Admitting: Urology

## 2021-11-26 ENCOUNTER — Ambulatory Visit
Admission: RE | Admit: 2021-11-26 | Discharge: 2021-11-26 | Disposition: A | Discharge status: Home / Self Care | Payer: BC Managed Care – PPO | Attending: Urology | Admitting: Urology

## 2021-11-26 DIAGNOSIS — R310 Gross hematuria: Secondary | ICD-10-CM

## 2021-11-26 DIAGNOSIS — N200 Calculus of kidney: Secondary | ICD-10-CM | POA: Insufficient documentation

## 2021-11-26 HISTORY — PX: CYSTOSCOPY W/ RETROGRADES: SHX1426

## 2021-11-26 LAB — GLUCOSE, CAPILLARY
Glucose-Capillary: 151 mg/dL — ABNORMAL HIGH (ref 70–99)
Glucose-Capillary: 102 mg/dL — ABNORMAL HIGH (ref 70–99)

## 2021-11-26 SURGERY — CYSTOSCOPY, WITH RETROGRADE PYELOGRAM
Anesthesia: General | Laterality: Bilateral

## 2021-11-26 MED ORDER — FENTANYL CITRATE (PF) 100 MCG/2ML IJ SOLN
INTRAMUSCULAR | Status: AC
  Filled 2021-11-26: qty 2

## 2021-11-26 MED ORDER — SEVOFLURANE IN SOLN
RESPIRATORY_TRACT | Status: AC
  Filled 2021-11-26: qty 250

## 2021-11-26 MED ORDER — ONDANSETRON HCL 4 MG/2ML IJ SOLN: 4.0000 mg | Freq: Once | INTRAMUSCULAR | Status: DC | PRN

## 2021-11-26 MED ORDER — FAMOTIDINE 20 MG PO TABS
ORAL_TABLET | ORAL | Status: AC
  Administered 2021-11-26: 20 mg via ORAL
  Filled 2021-11-26: qty 1

## 2021-11-26 MED ORDER — CEFAZOLIN SODIUM 1 G IJ SOLR
INTRAMUSCULAR | Status: AC
  Filled 2021-11-26: qty 20

## 2021-11-26 MED ORDER — CHLORHEXIDINE GLUCONATE 0.12 % MT SOLN
OROMUCOSAL | Status: AC
  Administered 2021-11-26: 15 mL via OROMUCOSAL
  Filled 2021-11-26: qty 15

## 2021-11-26 MED ORDER — PROPOFOL 10 MG/ML IV BOLUS
INTRAVENOUS | Status: DC | PRN
  Administered 2021-11-26: 200 mg via INTRAVENOUS

## 2021-11-26 MED ORDER — CHLORHEXIDINE GLUCONATE 0.12 % MT SOLN: 15.0000 mL | Freq: Once | OROMUCOSAL | Status: AC

## 2021-11-26 MED ORDER — SODIUM CHLORIDE 0.9 % IR SOLN
Status: DC | PRN
  Administered 2021-11-26: 3000 mL via INTRAVESICAL

## 2021-11-26 MED ORDER — MIDAZOLAM HCL 2 MG/2ML IJ SOLN
INTRAMUSCULAR | Status: AC
  Filled 2021-11-26: qty 2

## 2021-11-26 MED ORDER — IOHEXOL 180 MG/ML  SOLN
INTRAMUSCULAR | Status: DC | PRN
  Administered 2021-11-26: 20 mL

## 2021-11-26 MED ORDER — LIDOCAINE HCL (CARDIAC) PF 100 MG/5ML IV SOSY
PREFILLED_SYRINGE | INTRAVENOUS | Status: DC | PRN
  Administered 2021-11-26: 50 mg via INTRAVENOUS

## 2021-11-26 MED ORDER — FAMOTIDINE 20 MG PO TABS: 20.0000 mg | ORAL_TABLET | Freq: Once | ORAL | Status: AC

## 2021-11-26 MED ORDER — ONDANSETRON HCL 4 MG/2ML IJ SOLN
INTRAMUSCULAR | Status: AC
  Filled 2021-11-26: qty 2

## 2021-11-26 MED ORDER — CEFAZOLIN SODIUM-DEXTROSE 1-4 GM/50ML-% IV SOLN
INTRAVENOUS | Status: DC | PRN
  Administered 2021-11-26: 2 g via INTRAVENOUS

## 2021-11-26 MED ORDER — FENTANYL CITRATE (PF) 100 MCG/2ML IJ SOLN
INTRAMUSCULAR | Status: DC | PRN
  Administered 2021-11-26: 50 ug via INTRAVENOUS

## 2021-11-26 MED ORDER — FENTANYL CITRATE (PF) 100 MCG/2ML IJ SOLN: 25.0000 ug | INTRAMUSCULAR | Status: DC | PRN

## 2021-11-26 MED ORDER — FLUCONAZOLE 100MG IVPB
100.0000 mg | Freq: Once | INTRAVENOUS | Status: DC
  Filled 2021-11-26: qty 50

## 2021-11-26 MED ORDER — PROPOFOL 10 MG/ML IV BOLUS
INTRAVENOUS | Status: AC
  Filled 2021-11-26: qty 20

## 2021-11-26 MED ORDER — DEXAMETHASONE SODIUM PHOSPHATE 10 MG/ML IJ SOLN
INTRAMUSCULAR | Status: DC | PRN
  Administered 2021-11-26: 5 mg via INTRAVENOUS

## 2021-11-26 MED ORDER — DEXAMETHASONE SODIUM PHOSPHATE 10 MG/ML IJ SOLN
INTRAMUSCULAR | Status: AC
  Filled 2021-11-26: qty 1

## 2021-11-26 MED ORDER — ONDANSETRON HCL 4 MG/2ML IJ SOLN
INTRAMUSCULAR | Status: DC | PRN
  Administered 2021-11-26: 4 mg via INTRAVENOUS

## 2021-11-26 MED ORDER — SODIUM CHLORIDE 0.9 % IV SOLN: INTRAVENOUS | Status: DC

## 2021-11-26 MED ORDER — MIDAZOLAM HCL 2 MG/2ML IJ SOLN
INTRAMUSCULAR | Status: DC | PRN
  Administered 2021-11-26: 2 mg via INTRAVENOUS

## 2021-11-26 MED ORDER — ORAL CARE MOUTH RINSE: 15.0000 mL | Freq: Once | OROMUCOSAL | Status: AC

## 2021-11-26 MED ORDER — LIDOCAINE HCL (PF) 2 % IJ SOLN
INTRAMUSCULAR | Status: AC
  Filled 2021-11-26: qty 5

## 2021-11-26 SURGICAL SUPPLY — 34 items
BAG DRAIN CYSTO-URO LG1000N (MISCELLANEOUS) ×2 IMPLANT
BRUSH SCRUB EZ  4% CHG (MISCELLANEOUS)
BRUSH SCRUB EZ 1% IODOPHOR (MISCELLANEOUS) ×2 IMPLANT
BRUSH SCRUB EZ 4% CHG (MISCELLANEOUS) ×1 IMPLANT
CATH FOL LX CONE TIP  8F (CATHETERS) ×2
CATH FOL LX CONE TIP 8F (CATHETERS) IMPLANT
CATH URET FLEX-TIP 2 LUMEN 10F (CATHETERS) ×2 IMPLANT
CATH URETL OPEN 5X70 (CATHETERS) ×2 IMPLANT
CATH URETL OPEN END 6X70 (CATHETERS) ×2 IMPLANT
CNTNR SPEC 2.5X3XGRAD LEK (MISCELLANEOUS) ×1
CONT SPEC 4OZ STER OR WHT (MISCELLANEOUS) ×1
CONT SPEC 4OZ STRL OR WHT (MISCELLANEOUS) ×1
CONTAINER SPEC 2.5X3XGRAD LEK (MISCELLANEOUS) ×1 IMPLANT
DRAPE UTILITY 15X26 TOWEL STRL (DRAPES) ×2 IMPLANT
GAUZE 4X4 16PLY ~~LOC~~+RFID DBL (SPONGE) ×4 IMPLANT
GLOVE SURG UNDER POLY LF SZ7.5 (GLOVE) ×2 IMPLANT
GOWN STRL REUS W/ TWL LRG LVL3 (GOWN DISPOSABLE) ×2 IMPLANT
GOWN STRL REUS W/ TWL XL LVL3 (GOWN DISPOSABLE) ×1 IMPLANT
GOWN STRL REUS W/TWL LRG LVL3 (GOWN DISPOSABLE) ×4
GOWN STRL REUS W/TWL XL LVL3 (GOWN DISPOSABLE) ×2
GUIDEWIRE GREEN .038 145CM (MISCELLANEOUS) ×2 IMPLANT
GUIDEWIRE STR DUAL SENSOR (WIRE) ×2 IMPLANT
INFUSOR MANOMETER BAG 3000ML (MISCELLANEOUS) ×2 IMPLANT
IV NS IRRIG 3000ML ARTHROMATIC (IV SOLUTION) ×2 IMPLANT
KIT TURNOVER CYSTO (KITS) ×2 IMPLANT
MANIFOLD NEPTUNE II (INSTRUMENTS) ×2 IMPLANT
PACK CYSTO AR (MISCELLANEOUS) ×2 IMPLANT
SET CYSTO W/LG BORE CLAMP LF (SET/KITS/TRAYS/PACK) ×2 IMPLANT
SHEATH URETERAL 12FRX35CM (MISCELLANEOUS) ×2 IMPLANT
STENT URET 6FRX24 CONTOUR (STENTS) IMPLANT
STENT URET 6FRX26 CONTOUR (STENTS) IMPLANT
SURGILUBE 2OZ TUBE FLIPTOP (MISCELLANEOUS) ×2 IMPLANT
WATER STERILE IRR 1000ML POUR (IV SOLUTION) ×1 IMPLANT
WATER STERILE IRR 500ML POUR (IV SOLUTION) ×2 IMPLANT

## 2021-11-26 NOTE — Discharge Instructions (Signed)
Cystoscopy patient instructions  Following a cystoscopy, a catheter (a flexible rubber tube) is sometimes left in place to empty the bladder. This may cause some discomfort or a feeling that you need to urinate. Your doctor determines the period of time that the catheter will be left in place. You may have bloody urine for two to three days (Call your doctor if the amount of bleeding increases or does not subside).  You may pass blood clots in your urine, especially if you had a biopsy. It is not unusual to pass small blood clots and have some bloody urine a couple of weeks after your cystoscopy. Again, call your doctor if the bleeding does not subside. You may have: Dysuria (painful urination) Frequency (urinating often) Urgency (strong desire to urinate)  These symptoms are common especially if medicine is instilled into the bladder or a ureteral stent is placed. Avoiding alcohol and caffeine, such as coffee, tea, and chocolate, may help relieve these symptoms. Drink plenty of water, unless otherwise instructed. Your doctor may also prescribe an antibiotic or other medicine to reduce these symptoms.  Cystoscopy results are available soon after the procedure; biopsy results usually take two to four days. Your doctor will discuss the results of your exam with you. Before you go home, you will be given specific instructions for follow-up care.  No abnormalities were identified on cystoscopy or on kidney/ureter x-rays  Special Instructions:   If you are going home with a catheter in place do not take a tub bath until removed by your doctor.   You may resume your normal activities.   Do not drive or operate machinery if you are taking narcotic pain medicine.   Be sure to keep all follow-up appointments with your doctor.  You will be contacted for a follow-up appointment  Call Your Doctor If: You are unable to urinate You have a fever over 101 You have severe bleeding

## 2021-11-26 NOTE — Anesthesia Preprocedure Evaluation (Signed)
Anesthesia Evaluation  Patient identified by MRN, date of birth, ID band Patient awake    Reviewed: Allergy & Precautions, NPO status , Patient's Chart, lab work & pertinent test results  Airway Mallampati: II  TM Distance: >3 FB Neck ROM: full    Dental  (+) Teeth Intact   Pulmonary neg pulmonary ROS,    Pulmonary exam normal breath sounds clear to auscultation       Cardiovascular Exercise Tolerance: Good hypertension, Pt. on medications negative cardio ROS Normal cardiovascular exam Rhythm:Regular     Neuro/Psych Anxiety Depression negative neurological ROS  negative psych ROS   GI/Hepatic negative GI ROS, Neg liver ROS,   Endo/Other  negative endocrine ROSdiabetes, Well Controlled, Type 2  Renal/GU negative Renal ROS  negative genitourinary   Musculoskeletal negative musculoskeletal ROS (+)   Abdominal Normal abdominal exam  (+)   Peds negative pediatric ROS (+)  Hematology negative hematology ROS (+)   Anesthesia Other Findings Past Medical History: No date: Anxiety No date: Arthritis No date: Chronic venous insufficiency No date: Depression No date: High cholesterol No date: History of kidney stones No date: Hypertension 05/25/2013: Plantar fasciitis, bilateral No date: RLS (restless legs syndrome) No date: Type 2 diabetes mellitus (HCC) No date: Vitamin D deficiency  Past Surgical History: No date: AUGMENTATION MAMMAPLASTY 09/14/2020: COLONOSCOPY WITH PROPOFOL; N/A     Comment:  Procedure: COLONOSCOPY WITH PROPOFOL;  Surgeon: Jonathon Bellows, MD;  Location: [facility] ENDOSCOPY;  Service:               Gastroenterology;  Laterality: N/A; No date: CYSTOSCOPY 2008: ESOPHAGOGASTRIC FUNDOPLICATION     Comment:  at [facility], for GERD No date: KIDNEY STONE SURGERY No date: LITHOTRIPSY No date: NECK SURGERY     Comment:  x2 No date: PARTIAL HYSTERECTOMY No date: PLACEMENT OF BREAST IMPLANTS  BMI     Body Mass Index: 28.25 kg/m      Reproductive/Obstetrics negative OB ROS                             Anesthesia Physical Anesthesia Plan  ASA: 2  Anesthesia Plan: General   Post-op Pain Management:    Induction: Intravenous  PONV Risk Score and Plan: 1 and Ondansetron  Airway Management Planned: LMA  Additional Equipment:   Intra-op Plan:   Post-operative Plan: Extubation in OR  Informed Consent: I have reviewed the patients History and Physical, chart, labs and discussed the procedure including the risks, benefits and alternatives for the proposed anesthesia with the patient or authorized representative who has indicated his/her understanding and acceptance.     Dental Advisory Given  Plan Discussed with: CRNA and Surgeon  Anesthesia Plan Comments:         Anesthesia Quick Evaluation

## 2021-11-26 NOTE — Transfer of Care (Signed)
Immediate Anesthesia Transfer of Care Note  Patient: Andrea Mcdonald  Procedure(s) Performed: CYSTOSCOPY WITH RETROGRADE PYELOGRAM (Bilateral)  Patient Location: PACU  Anesthesia Type:General  Level of Consciousness: drowsy  Airway & Oxygen Therapy: Patient Spontanous Breathing and Patient connected to face mask oxygen  Post-op Assessment: Report given to RN and Post -op Vital signs reviewed and stable  Post vital signs: Reviewed and stable  Last Vitals:  Vitals Value Taken Time  BP 141/85 11/26/21 1154  Temp    Pulse 64 11/26/21 1156  Resp 17 11/26/21 1156  SpO2 98 % 11/26/21 1156  Vitals shown include unvalidated device data.  Last Pain:  Vitals:   11/26/21 0826  TempSrc: Temporal  PainSc: 1          Complications: No notable events documented.

## 2021-11-26 NOTE — Op Note (Signed)
Preoperative diagnosis:  Gross hematuria  Postoperative diagnosis:  Gross hematuria  Procedure: Cystoscopy with bilateral retrograde pyelograms  Surgeon: Abbie Sons, MD  Anesthesia: General  Complications: None  Intraoperative findings:  1.  Cystoscopy-bladder mucosa normal in appearance without erythema, solid or papillary lesions.  UOs orthotopic with clear efflux 2.  Right retrograde pyelogram-ureter normal in appearance without dilation, filling defects or narrowing.  Collecting system without filling defects, dilation.  Collecting system delicate without abnormalities 3.  Left retrograde pyelogram-ureter normal in appearance without dilation, filling defects or narrowing.  Collecting system without filling defects, dilation.  Collecting system delicate without abnormalities  EBL: Minimal  Specimens: None  Indication: Andrea Mcdonald is a 61 y.o. patient with recurrent gross hematuria.  CT urogram showed a punctate nonobstructing right renal calculus.  Ureters were not completely opacified.  Office cystoscopy was unremarkable.  Due to persistent gross hematuria cystoscopy with retrograde pyelograms was recommended.  Over the last 2 weeks her hematuria has significantly improved.  After reviewing the management options for treatment, he elected to proceed with the above surgical procedure(s). We have discussed the potential benefits and risks of the procedure, side effects of the proposed treatment, the likelihood of the patient achieving the goals of the procedure, and any potential problems that might occur during the procedure or recuperation. Informed consent has been obtained.  Description of procedure:  The patient was taken to the operating room and general anesthesia was induced.  The patient was placed in the dorsal lithotomy position, prepped and draped in the usual sterile fashion, and preoperative antibiotics were administered. A preoperative time-out was performed.    A 21 French cystoscope sheath with obturator was lubricated and passed per urethra.  A 30 degree lens was then placed into the sheath and panendoscopy was performed with findings as described above.  An 8 [place] cone-tip catheter was placed through the cystoscope and into the right ureteral orifice.  Retrograde pyelogram was performed with the findings as described above.  An identical procedure was performed on the contralateral side with findings as described above.  The bladder was emptied and cystoscope was removed.  After anesthetic reversal she was transported to PACU in stable condition.  Plan: Schedule postop follow-up 4-6 weeks   Abbie Sons, M.D.

## 2021-11-26 NOTE — Anesthesia Postprocedure Evaluation (Signed)
Anesthesia Post Note  Patient: Andrea Mcdonald  Procedure(s) Performed: CYSTOSCOPY WITH RETROGRADE PYELOGRAM (Bilateral)  Patient location during evaluation: PACU Anesthesia Type: General Level of consciousness: awake and awake and alert Pain management: satisfactory to patient Vital Signs Assessment: post-procedure vital signs reviewed and stable Respiratory status: respiratory function stable and nonlabored ventilation Cardiovascular status: stable Anesthetic complications: no   No notable events documented.   Last Vitals:  Vitals:   11/26/21 1215 11/26/21 1238  BP: 110/69 117/66  Pulse: 68 71  Resp: 11 16  Temp:  (!) 36.2 C  SpO2: 98% 96%    Last Pain:  Vitals:   11/26/21 1238  TempSrc: Temporal  PainSc: 0-No pain                 VAN STAVEREN,Prapti Grussing

## 2021-11-26 NOTE — Interval H&P Note (Signed)
History and Physical Interval Note:  Hematuria has significantly improved. CV: RRR Lungs: Clear   11/26/2021 11:04 AM  Andrea Mcdonald  has presented today for surgery, with the diagnosis of Gross Hematuria.  The various methods of treatment have been discussed with the patient and family. After consideration of risks, benefits and other options for treatment, the patient has consented to  Procedure(s): CYSTOSCOPY WITH RETROGRADE PYELOGRAM (Bilateral) URETEROSCOPY (Bilateral) CYSTOSCOPY WITH STENT PLACEMENT (Bilateral) as a surgical intervention.  The patient's history has been reviewed, patient examined, no change in status, stable for surgery.  I have reviewed the patient's chart and labs.  Questions were answered to the patient's satisfaction.     [place]

## 2021-12-09 ENCOUNTER — Ambulatory Visit: Payer: BC Managed Care – PPO | Admitting: Urology

## 2022-01-15 ENCOUNTER — Ambulatory Visit: Payer: BC Managed Care – PPO | Admitting: Urology

## 2022-02-21 ENCOUNTER — Ambulatory Visit: Payer: No Typology Code available for payment source | Admitting: Family Medicine

## 2022-02-24 DIAGNOSIS — G5603 Carpal tunnel syndrome, bilateral upper limbs: Secondary | ICD-10-CM | POA: Diagnosis not present

## 2022-04-17 ENCOUNTER — Telehealth: Payer: Self-pay

## 2022-04-17 DIAGNOSIS — G5603 Carpal tunnel syndrome, bilateral upper limbs: Secondary | ICD-10-CM | POA: Diagnosis not present

## 2022-04-17 DIAGNOSIS — E1169 Type 2 diabetes mellitus with other specified complication: Secondary | ICD-10-CM

## 2022-04-17 DIAGNOSIS — M65341 Trigger finger, right ring finger: Secondary | ICD-10-CM | POA: Diagnosis not present

## 2022-04-17 MED ORDER — TRULICITY 1.5 MG/0.5ML ~~LOC~~ SOAJ: 1.5000 mg | SUBCUTANEOUS | 1 refills | Status: DC

## 2022-04-17 NOTE — Telephone Encounter (Signed)
Thanks for update!  I have sent new order Trulicity 1.'5mg'$  weekly to her pharmacy, for 90 day supply +1 refill.  It looks like the reason no new orders since 2022 was because the original order for was for 1 year, 90 day +3 refills, so the original rx should have been good through October 2023. However if pharmacy needs new PA, that makes sense why they need an update and we never received anything.  Hopefully PA is available now.  Thanks  Nobie Putnam, [facility] 04/17/2022, 2:46 PM

## 2022-04-17 NOTE — Telephone Encounter (Signed)
Copied from [place] [phone]. Topic: General - Call Back - No Documentation >> Apr 17, 2022 10:00 AM [name] E wrote: Reason for CRM: Pt wants an update on the Prior Authorization process for her Trulicity, she says that her pharmacy submitted this PA  request over a week ago. Please call back  Best contact: [phone]   There has not been a renewed prescription for this since Oct of last year. I have no request in my list on cover my meds regarding this.. Please advise.. Once it's ordered, a PA should come through on my end to complete.

## 2022-04-25 DIAGNOSIS — Z0100 Encounter for examination of eyes and vision without abnormal findings: Secondary | ICD-10-CM | POA: Diagnosis not present

## 2022-04-25 DIAGNOSIS — E119 Type 2 diabetes mellitus without complications: Secondary | ICD-10-CM | POA: Diagnosis not present

## 2022-04-25 LAB — HM DIABETES EYE EXAM

## 2022-05-08 DIAGNOSIS — M4316 Spondylolisthesis, lumbar region: Secondary | ICD-10-CM | POA: Diagnosis not present

## 2022-06-19 DIAGNOSIS — M4316 Spondylolisthesis, lumbar region: Secondary | ICD-10-CM | POA: Diagnosis not present

## 2022-07-02 DIAGNOSIS — M4316 Spondylolisthesis, lumbar region: Secondary | ICD-10-CM | POA: Diagnosis not present

## 2022-07-18 DIAGNOSIS — M4316 Spondylolisthesis, lumbar region: Secondary | ICD-10-CM | POA: Diagnosis not present

## 2022-08-06 DIAGNOSIS — E119 Type 2 diabetes mellitus without complications: Secondary | ICD-10-CM | POA: Diagnosis not present

## 2022-08-06 DIAGNOSIS — M5416 Radiculopathy, lumbar region: Secondary | ICD-10-CM | POA: Diagnosis not present

## 2022-08-07 DIAGNOSIS — M7551 Bursitis of right shoulder: Secondary | ICD-10-CM | POA: Diagnosis not present

## 2022-08-18 ENCOUNTER — Encounter: Payer: Self-pay | Admitting: Emergency Medicine

## 2022-08-18 ENCOUNTER — Emergency Department
Admission: EM | Admit: 2022-08-18 | Discharge: 2022-08-18 | Disposition: A | Discharge status: Home / Self Care | Payer: BC Managed Care – PPO | Attending: Emergency Medicine | Admitting: Emergency Medicine

## 2022-08-18 ENCOUNTER — Other Ambulatory Visit: Payer: Self-pay

## 2022-08-18 DIAGNOSIS — R519 Headache, unspecified: Secondary | ICD-10-CM | POA: Insufficient documentation

## 2022-08-18 DIAGNOSIS — E1165 Type 2 diabetes mellitus with hyperglycemia: Secondary | ICD-10-CM | POA: Insufficient documentation

## 2022-08-18 DIAGNOSIS — R739 Hyperglycemia, unspecified: Secondary | ICD-10-CM

## 2022-08-18 LAB — CBC
HCT: 37.1 % (ref 36.0–46.0)
Hemoglobin: 12.6 g/dL (ref 12.0–15.0)
MCH: 29.4 pg (ref 26.0–34.0)
MCHC: 34 g/dL (ref 30.0–36.0)
MCV: 86.5 fL (ref 80.0–100.0)
Platelets: 184 10*3/uL (ref 150–400)
RBC: 4.29 MIL/uL (ref 3.87–5.11)
RDW: 12.8 % (ref 11.5–15.5)
WBC: 7.9 10*3/uL (ref 4.0–10.5)
nRBC: 0 % (ref 0.0–0.2)

## 2022-08-18 LAB — URINALYSIS, ROUTINE W REFLEX MICROSCOPIC
Bacteria, UA: NONE SEEN
Bilirubin Urine: NEGATIVE
Glucose, UA: >=500 mg/dL — AB
Ketones, ur: NEGATIVE mg/dL
Leukocytes,Ua: NEGATIVE
Nitrite: NEGATIVE
Protein, ur: NEGATIVE mg/dL
Specific Gravity, Urine: 1.035 — ABNORMAL HIGH (ref 1.005–1.030)
pH: 5 (ref 5.0–8.0)

## 2022-08-18 LAB — BASIC METABOLIC PANEL
Anion gap: 9 (ref 5–15)
BUN: 20 mg/dL (ref 8–23)
CO2: 23 mmol/L (ref 22–32)
Calcium: 9.3 mg/dL (ref 8.9–10.3)
Chloride: 102 mmol/L (ref 98–111)
Creatinine, Ser: 0.9 mg/dL (ref 0.44–1.00)
GFR, Estimated: >60 mL/min (ref >60)
Glucose, Bld: 478 mg/dL — ABNORMAL HIGH (ref 70–99)
Potassium: 4.3 mmol/L (ref 3.5–5.1)
Sodium: 134 mmol/L — ABNORMAL LOW (ref 135–145)

## 2022-08-18 LAB — CBG MONITORING, ED
Glucose-Capillary: 482 mg/dL — ABNORMAL HIGH (ref 70–99)
Glucose-Capillary: 420 mg/dL — ABNORMAL HIGH (ref 70–99)

## 2022-08-18 MED ORDER — INSULIN ASPART 100 UNIT/ML IJ SOLN
5.0000 [IU] | Freq: Once | INTRAMUSCULAR | Status: AC
  Administered 2022-08-18: 5 [IU] via SUBCUTANEOUS
  Filled 2022-08-18: qty 1

## 2022-08-18 MED ORDER — INSULIN ASPART 100 UNIT/ML IJ SOLN
5.0000 [IU] | Freq: Once | INTRAMUSCULAR | Status: AC
  Administered 2022-08-18: 5 [IU] via INTRAVENOUS
  Filled 2022-08-18: qty 1

## 2022-08-18 MED ORDER — SODIUM CHLORIDE 0.9 % IV BOLUS
1000.0000 mL | Freq: Once | INTRAVENOUS | Status: AC
Start: 2022-08-18 — End: 2022-08-18
  Administered 2022-08-18: 1000 mL via INTRAVENOUS

## 2022-08-18 NOTE — ED Provider Notes (Signed)
[facility] Provider Note    Event Date/Time   First MD Initiated Contact with Patient 08/18/22 [phone]     (approximate)   History   Hyperglycemia   HPI  Andrea Mcdonald is a 61 y.o. female with history of diabetes, reports compliance with her metformin and Trulicity who presents with complaints of elevated glucose.  She reports she had blurry vision, has been urinating frequently.  And is somewhat of a dry mouth.  She notes that she did receive a steroid injection recently, usually her sugar is well controlled.  No fevers or chills, she did have a mild headache as well.     Physical Exam   Triage Vital Signs: ED Triage Vitals  Enc Vitals Group     BP 08/18/22 0905 (!) 147/78     Pulse Rate 08/18/22 0905 84     Resp 08/18/22 0905 16     Temp 08/18/22 0905 98.6 F (37 C)     Temp Source 08/18/22 0905 Oral     SpO2 08/18/22 0905 98 %     Weight 08/18/22 0900 79.4 kg (175 lb)     Height 08/18/22 0900 1.651 m ('5\' 5"'$ )     Head Circumference --      Peak Flow --      Pain Score 08/18/22 0859 6     Pain Loc --      Pain Edu? --      Excl. in [place]? --     Most recent vital signs: Vitals:   08/18/22 0905  BP: (!) 147/78  Pulse: 84  Resp: 16  Temp: 98.6 F (37 C)  SpO2: 98%     General: Awake, no distress.  CV:  Good peripheral perfusion.  Resp:  Normal effort.  Abd:  No distention.  Other:     ED Results / Procedures / Treatments   Labs (all labs ordered are listed, but only abnormal results are displayed) Labs Reviewed  BASIC METABOLIC PANEL - Abnormal; Notable for the following components:      Result Value   Sodium 134 (*)    Glucose, Bld 478 (*)    All other components within normal limits  URINALYSIS, ROUTINE W REFLEX MICROSCOPIC - Abnormal; Notable for the following components:   Color, Urine STRAW (*)    APPearance CLEAR (*)    Specific Gravity, Urine 1.035 (*)    Glucose, UA >=500 (*)    Hgb urine dipstick SMALL (*)     All other components within normal limits  CBG MONITORING, ED - Abnormal; Notable for the following components:   Glucose-Capillary 482 (*)    All other components within normal limits  CBG MONITORING, ED - Abnormal; Notable for the following components:   Glucose-Capillary 420 (*)    All other components within normal limits  CBC  CBG MONITORING, ED     EKG     RADIOLOGY     PROCEDURES:  Critical Care performed:   Procedures   MEDICATIONS ORDERED IN ED: Medications  sodium chloride 0.9 % bolus 1,000 mL (0 mLs Intravenous Stopped 08/18/22 0940)  insulin aspart (novoLOG) injection 5 Units (5 Units Intravenous Given 08/18/22 0959)  insulin aspart (novoLOG) injection 5 Units (5 Units Subcutaneous Given 08/18/22 0959)     IMPRESSION / MDM / ASSESSMENT AND PLAN / ED COURSE  I reviewed the triage vital signs and the nursing notes. Patient's presentation is most consistent with acute presentation with potential threat to life  or bodily function.   Patient presents with hyperglycemia as detailed above.  Differential includes hyperglycemia secondary to steroid versus DKA  Her BMP demonstrates normal anion gap however her sugar is over 400.  Not consistent with DKA .patient treated with IV fluids to help rehydrate.  This improved her headache dramatically and her blurry vision has resolved.  Glucose is trending down, will give 5 units subcu, 5 units IV,  Lab work reviewed and is overall reassuring no ketones in the urine.       FINAL CLINICAL IMPRESSION(S) / ED DIAGNOSES   Final diagnoses:  Hyperglycemia     Rx / DC Orders   ED Discharge Orders     None        Note:  This document was prepared using Dragon voice recognition software and may include unintentional dictation errors.   Lavonia Drafts, MD 08/18/22 [phone]

## 2022-08-18 NOTE — ED Triage Notes (Signed)
Pt via POV from home. Pt recently had steroid injection 1 week ago. Pt checked her CBG this AM and it was 536. Pt has a hx of DM, take oral medication and is not on insulin. Pt c/o headache. Pt is A&Ox4 and NAD

## 2022-08-19 ENCOUNTER — Telehealth: Payer: Self-pay

## 2022-08-19 NOTE — Telephone Encounter (Signed)
Error

## 2022-08-19 NOTE — Telephone Encounter (Signed)
Transition Care Management Unsuccessful Follow-up Telephone Call  Date of discharge and from where:  08/18/22 from [facility] ER  Attempts:  1st Attempt  Reason for unsuccessful TCM follow-up call:  Left voice message  Patient canceled last DM follow up in 4/23.

## 2022-08-20 ENCOUNTER — Ambulatory Visit: Payer: Self-pay | Admitting: *Deleted

## 2022-08-20 NOTE — Telephone Encounter (Signed)
Summary: Blood sugar of 536 and appt needed   Pts blood sugar was 536 and she went to the ER and was advised to call and schedule appt / pt can only come in after 3pm / no afternoon appts available / please advise      2nd call attempted to contact patient. No answer, LVMTCB [phone].

## 2022-08-20 NOTE — Telephone Encounter (Signed)
Summary: Blood sugar of 536 and appt needed   Pts blood sugar was 536 and she went to the ER and was advised to call and schedule appt / pt can only come in after 3pm / no afternoon appts available / please advise     Attempted to call patient- no answer- left message to call office

## 2022-08-20 NOTE — Telephone Encounter (Signed)
3rd attempt to contact patient to review elevated blood glucose. No answer, LVMTCB.

## 2022-08-21 DIAGNOSIS — M4802 Spinal stenosis, cervical region: Secondary | ICD-10-CM | POA: Diagnosis not present

## 2022-08-21 DIAGNOSIS — M5416 Radiculopathy, lumbar region: Secondary | ICD-10-CM | POA: Diagnosis not present

## 2022-08-21 NOTE — Telephone Encounter (Signed)
Left message for patient to call back.  Next week Dr. Raliegh Ip is out of the office.  However, patient can schedule with covering provider.

## 2022-08-25 ENCOUNTER — Ambulatory Visit: Payer: BC Managed Care – PPO | Admitting: Physician Assistant

## 2022-08-25 ENCOUNTER — Encounter (INDEPENDENT_AMBULATORY_CARE_PROVIDER_SITE_OTHER): Payer: Self-pay

## 2022-08-25 ENCOUNTER — Encounter: Payer: Self-pay | Admitting: Physician Assistant

## 2022-08-25 VITALS — BP 87/51 | HR 79 | Ht 65.0 in | Wt 170.0 lb

## 2022-08-25 DIAGNOSIS — I10 Essential (primary) hypertension: Secondary | ICD-10-CM | POA: Diagnosis not present

## 2022-08-25 DIAGNOSIS — E1169 Type 2 diabetes mellitus with other specified complication: Secondary | ICD-10-CM

## 2022-08-25 NOTE — Progress Notes (Unsigned)
Established Patient Office Visit  Name: Andrea Mcdonald   MRN: [id number]    DOB: 04-09-1961   Date:08/26/2022  Today's Provider: Talitha Givens, MHS, PA-C Introduced myself to the patient as a PA-C and provided education on APPs in clinical practice.         Subjective  Chief Complaint  Chief Complaint  Patient presents with   Diabetes    HPI   Reports she has been going to [facility] for back pain She reports they told her to watch her blood glucose since getting this  States glucose has been up to 500 which prompted her to go to the ED  Unsure when her last steroid injection was with Ortho     Diabetes, Type 2 - Last A1c 7.4 one year ago - Medications: Metformin 224 mg PO BID , Trulicity 1.5 mg/ dose per week  - Compliance: excellent  - Checking BG at home:  High blood glucose readings  Today POCT glucose was 343  - Diet: states she has been trying to cut out sugary things, and is eating smaller portions  - Exercise: She reports she walks at work  - Eye exam: Up to date  - Foot exam: Up to date  - Microalbumin: Ordered today  - Statin: On rosuvastatin 20 mg PO QD  - PNA vaccine: will update at next apt - Denies symptoms of hypoglycemia, polyuria, polydipsia, numbness extremities, foot ulcers/trauma    Patient Active Problem List   Diagnosis Date Noted   Acquired trigger finger of right middle finger 08/12/2021   Major depression, recurrent, full remission ([place]) 07/23/2020   Restless leg syndrome 04/20/2018   Leg pain 04/02/2018   Chronic venous insufficiency 04/02/2018   Diarrhea, functional 03/16/2017   Vitamin D deficiency 07/08/2016   Allergic rhinitis 02/12/2016   Overweight (BMI 25.0-29.9) 02/12/2016   RUQ abdominal pain 12/11/2015   Abdominal pain, LLQ (left lower quadrant) 12/11/2015   Type 2 diabetes mellitus with other specified complication ([place]) 49/75/3005   Hyperlipidemia associated with type 2 diabetes mellitus ([place]) 06/07/2015    Essential hypertension 06/07/2015   Anxiety 06/07/2015   Plantar fasciitis, bilateral     Past Surgical History:  Procedure Laterality Date   AUGMENTATION MAMMAPLASTY     COLONOSCOPY WITH PROPOFOL N/A 09/14/2020   Procedure: COLONOSCOPY WITH PROPOFOL;  Surgeon: Jonathon Bellows, MD;  Location: [facility] ENDOSCOPY;  Service: Gastroenterology;  Laterality: N/A;   CYSTOSCOPY     CYSTOSCOPY W/ RETROGRADES Bilateral 11/26/2021   Procedure: CYSTOSCOPY WITH RETROGRADE PYELOGRAM;  Surgeon: Abbie Sons, MD;  Location: [facility] ORS;  Service: Urology;  Laterality: Bilateral;   ESOPHAGOGASTRIC FUNDOPLICATION  1102   at [facility], for GERD   KIDNEY STONE SURGERY     LITHOTRIPSY     NECK SURGERY     x2   PARTIAL HYSTERECTOMY     PLACEMENT OF BREAST IMPLANTS      Family History  Problem Relation Age of Onset   Heart disease Mother    Depression Mother    Anxiety disorder Mother    Cancer Mother        retinal   Breast cancer Neg Hx     Social History   Tobacco Use   Smoking status: Never   Smokeless tobacco: Never  Substance Use Topics   Alcohol use: Yes    Comment: drink occ     Current Outpatient Medications:    albuterol (VENTOLIN HFA) 108 (90 Base)  MCG/ACT inhaler, Inhale 2 puffs into the lungs every 4 (four) hours as needed for wheezing or shortness of breath (cough). (Patient taking differently: Inhale 2 puffs into the lungs every 4 (four) hours as needed for wheezing or shortness of breath (cough). WAS GIVEN THIS ONLY WHEN SHE HAD COVID), Disp: 6.7 each, Rfl: 0   fluticasone (FLONASE) 50 MCG/ACT nasal spray, Use 2 spray(s) in each nostril once daily (Patient taking differently: Place 2 sprays into both nostrils daily as needed for allergies.), Disp: 48 g, Rfl: 1   ibuprofen (ADVIL) 200 MG tablet, Take 600 mg by mouth every 6 (six) hours as needed for moderate pain., Disp: , Rfl:    lisinopril (ZESTRIL) 5 MG tablet, Take 1 tablet (5 mg total) by mouth daily. (Patient taking differently: Take  5 mg by mouth every morning.), Disp: 90 tablet, Rfl: 3   metFORMIN (GLUCOPHAGE-XR) 500 MG 24 hr tablet, Take 1 tablet (500 mg total) by mouth 2 (two) times daily with a meal., Disp: 180 tablet, Rfl: 3   naphazoline-pheniramine (NAPHCON-A) 0.025-0.3 % ophthalmic solution, Place 1 drop into both eyes 4 (four) times daily as needed for eye irritation., Disp: , Rfl:    Omega-3 Fatty Acids (FISH OIL PO), Take 1 capsule by mouth in the morning., Disp: , Rfl:    promethazine (PHENERGAN) 25 MG tablet, Take 1 tablet (25 mg total) by mouth every 6 (six) hours as needed for nausea or vomiting., Disp: 30 tablet, Rfl: 0   rosuvastatin (CRESTOR) 20 MG tablet, Take 1 tablet (20 mg total) by mouth at bedtime., Disp: 90 tablet, Rfl: 3   sertraline (ZOLOFT) 100 MG tablet, Take 1.5 tablets (150 mg total) by mouth at bedtime., Disp: 135 tablet, Rfl: 3   TRULICITY 1.5 UU/7.2ZD SOPN, Inject 1.5 mg into the skin once a week., Disp: 6 mL, Rfl: 1   EQ LORATADINE 10 MG tablet, Take 1 tablet by mouth once daily (Patient not taking: Reported on 11/13/2021), Disp: 90 tablet, Rfl: 3   ondansetron (ZOFRAN ODT) 4 MG disintegrating tablet, Take 1 tablet (4 mg total) by mouth every 8 (eight) hours as needed for nausea or vomiting. (Patient not taking: Reported on 11/13/2021), Disp: 30 tablet, Rfl: 0  Allergies  Allergen Reactions   Codeine Itching    I personally reviewed active problem list, medication list, allergies, health maintenance, notes from last encounter, lab results with the patient/caregiver today.   Review of Systems  Constitutional:  Positive for malaise/fatigue. Negative for chills and fever.  Eyes:  Negative for blurred vision and double vision.  Musculoskeletal:  Positive for back pain.      Objective  Vitals:   08/25/22 1551  BP: (!) 87/51  Pulse: 79  SpO2: 100%  Weight: 170 lb (77.1 kg)  Height: _0  (1.651 m)    Body mass index is 28.29 kg/m.  Physical Exam Vitals reviewed.   Constitutional:      General: She is awake.     Appearance: Normal appearance. She is well-developed and well-groomed.  HENT:     Head: Normocephalic and atraumatic.  Eyes:     General: Lids are normal. Gaze aligned appropriately.     Extraocular Movements: Extraocular movements intact.     Conjunctiva/sclera: Conjunctivae normal.  Pulmonary:     Effort: Pulmonary effort is normal.  Musculoskeletal:     Cervical back: Normal range of motion.  Neurological:     General: No focal deficit present.     Mental Status: She is  alert and oriented to person, place, and time.     GCS: GCS eye subscore is 4. GCS verbal subscore is 5. GCS motor subscore is 6.     Cranial Nerves: No dysarthria or facial asymmetry.  Psychiatric:        Attention and Perception: Attention and perception normal.        Mood and Affect: Mood and affect normal.        Speech: Speech normal.        Behavior: Behavior normal. Behavior is cooperative.      Recent Results (from the past 2160 hour(s))  CBG monitoring, ED     Status: Abnormal   Collection Time: 08/18/22  9:01 AM  Result Value Ref Range   Glucose-Capillary 482 (H) 70 - 99 mg/dL    Comment: Glucose reference range applies only to samples taken after fasting for at least 8 hours.   Comment 1 Notify RN    Comment 2 Document in Chart   Basic metabolic panel     Status: Abnormal   Collection Time: 08/18/22  9:06 AM  Result Value Ref Range   Sodium 134 (L) 135 - 145 mmol/L   Potassium 4.3 3.5 - 5.1 mmol/L   Chloride 102 98 - 111 mmol/L   CO2 23 22 - 32 mmol/L   Glucose, Bld 478 (H) 70 - 99 mg/dL    Comment: Glucose reference range applies only to samples taken after fasting for at least 8 hours.   BUN 20 8 - 23 mg/dL   Creatinine, Ser 0.90 0.44 - 1.00 mg/dL   Calcium 9.3 8.9 - 10.3 mg/dL   GFR, Estimated >60 >60 mL/min    Comment: (NOTE) Calculated using the CKD-EPI Creatinine Equation (2021)    Anion gap 9 5 - 15    Comment: Performed at  [facility], [address] [place], [place] [address]  CBC     Status: None   Collection Time: 08/18/22  9:06 AM  Result Value Ref Range   WBC 7.9 4.0 - 10.5 K/uL   RBC 4.29 3.87 - 5.11 MIL/uL   Hemoglobin 12.6 12.0 - 15.0 g/dL   HCT 37.1 36.0 - 46.0 %   MCV 86.5 80.0 - 100.0 fL   MCH 29.4 26.0 - 34.0 pg   MCHC 34.0 30.0 - 36.0 g/dL   RDW 12.8 11.5 - 15.5 %   Platelets 184 150 - 400 K/uL   nRBC 0.0 0.0 - 0.2 %    Comment: Performed at [facility], [address] [place], [place] [address]  Urinalysis, Routine w reflex microscopic     Status: Abnormal   Collection Time: 08/18/22  9:06 AM  Result Value Ref Range   Color, Urine STRAW (A) YELLOW   APPearance CLEAR (A) CLEAR   Specific Gravity, Urine 1.035 (H) 1.005 - 1.030   pH 5.0 5.0 - 8.0   Glucose, UA >=500 (A) NEGATIVE mg/dL   Hgb urine dipstick SMALL (A) NEGATIVE   Bilirubin Urine NEGATIVE NEGATIVE   Ketones, ur NEGATIVE NEGATIVE mg/dL   Protein, ur NEGATIVE NEGATIVE mg/dL   Nitrite NEGATIVE NEGATIVE   Leukocytes,Ua NEGATIVE NEGATIVE   RBC / HPF 0-5 0 - 5 RBC/hpf   WBC, UA 0-5 0 - 5 WBC/hpf   Bacteria, UA NONE SEEN NONE SEEN   Squamous Epithelial / LPF 0-5 0 - 5    Comment: Performed at [facility], [address] [place], [place] [address]  CBG monitoring, ED  Status: Abnormal   Collection Time: 08/18/22  9:42 AM  Result Value Ref Range   Glucose-Capillary 420 (H) 70 - 99 mg/dL    Comment: Glucose reference range applies only to samples taken after fasting for at least 8 hours.  HgB A1c     Status: Abnormal   Collection Time: 08/25/22  4:25 PM  Result Value Ref Range   Hgb A1c MFr Bld 11.3 (H) <5.7 % of total Hgb    Comment: For someone without known diabetes, a hemoglobin A1c value of 6.5% or greater indicates that they may have  diabetes and this should be confirmed with a follow-up  test. . For someone with known diabetes, a value <7% indicates  that their diabetes is well  controlled and a value  greater than or equal to 7% indicates suboptimal  control. A1c targets should be individualized based on  duration of diabetes, age, comorbid conditions, and  other considerations. . Currently, no consensus exists regarding use of hemoglobin A1c for diagnosis of diabetes for children. .    Mean Plasma Glucose 278 mg/dL   eAG (mmol/L) 15.4 mmol/L  CBC w/Diff/Platelet     Status: None   Collection Time: 08/25/22  4:25 PM  Result Value Ref Range   WBC 6.6 3.8 - 10.8 Thousand/uL   RBC 4.19 3.80 - 5.10 Million/uL   Hemoglobin 12.3 11.7 - 15.5 g/dL   HCT 37.0 35.0 - 45.0 %   MCV 88.3 80.0 - 100.0 fL   MCH 29.4 27.0 - 33.0 pg   MCHC 33.2 32.0 - 36.0 g/dL   RDW 13.1 11.0 - 15.0 %   Platelets 194 140 - 400 Thousand/uL   MPV 10.9 7.5 - 12.5 fL   Neutro Abs 3,703 1,500 - 7,800 cells/uL   Lymphs Abs 2,389 850 - 3,900 cells/uL   Absolute Monocytes 442 200 - 950 cells/uL   Eosinophils Absolute 33 15 - 500 cells/uL   Basophils Absolute 33 0 - 200 cells/uL   Neutrophils Relative % 56.1 %   Total Lymphocyte 36.2 %   Monocytes Relative 6.7 %   Eosinophils Relative 0.5 %   Basophils Relative 0.5 %  COMPLETE METABOLIC PANEL WITH GFR     Status: Abnormal   Collection Time: 08/25/22  4:25 PM  Result Value Ref Range   Glucose, Bld 345 (H) 65 - 139 mg/dL    Comment: .        Non-fasting reference interval .    BUN 22 7 - 25 mg/dL   Creat 1.01 0.50 - 1.05 mg/dL   eGFR 63 > OR = 60 mL/min/1.10m   BUN/Creatinine Ratio SEE NOTE: 6 - 22 (calc)    Comment:    Not Reported: BUN and Creatinine are within    reference range. .    Sodium 136 135 - 146 mmol/L   Potassium 4.2 3.5 - 5.3 mmol/L   Chloride 102 98 - 110 mmol/L   CO2 24 20 - 32 mmol/L   Calcium 10.1 8.6 - 10.4 mg/dL   Total Protein 6.7 6.1 - 8.1 g/dL   Albumin 4.5 3.6 - 5.1 g/dL   Globulin 2.2 1.9 - 3.7 g/dL (calc)   AG Ratio 2.0 1.0 - 2.5 (calc)   Total Bilirubin 0.8 0.2 - 1.2 mg/dL   Alkaline  phosphatase (APISO) 73 37 - 153 U/L   AST 11 10 - 35 U/L   ALT 15 6 - 29 U/L  Urine Microalbumin w/creat. ratio     Status:  None   Collection Time: 08/25/22  4:25 PM  Result Value Ref Range   Creatinine, Urine 246 20 - 275 mg/dL   Microalb, Ur 5.4 mg/dL    Comment: Reference Range Not established    Microalb Creat Ratio 22 <30 mcg/mg creat    Comment: . The ADA defines abnormalities in albumin excretion as follows: Marland Kitchen Albuminuria Category        Result (mcg/mg creatinine) . Normal to Mildly increased   <30 Moderately increased         30-299  Severely increased           > OR = 300 . The ADA recommends that at least two of three specimens collected within a 3-6 month period be abnormal before considering a patient to be within a diagnostic category.      PHQ2/9:    07/23/2020   10:37 AM 07/21/2019   10:24 AM 01/21/2019    3:44 PM 10/22/2018    3:41 PM 07/19/2018    3:56 PM  Depression screen PHQ 2/9  Decreased Interest 0 0 0 1 0  Down, Depressed, Hopeless 0 0 1 1 0  PHQ - 2 Score 0 0 1 2 0  Altered sleeping 0 0 1 1   Tired, decreased energy 0 1 0 1   Change in appetite 0 _0 Feeling bad or failure about yourself  0 0 0 0   Trouble concentrating 0 _1 Moving slowly or fidgety/restless 0 0 0 0   Suicidal thoughts 0 0 0 0   PHQ-9 Score 0 _2 Difficult doing work/chores Not difficult at all Not difficult at all Not difficult at all Not difficult at all       Fall Risk:    11/14/2020   11:29 AM 07/23/2020   10:00 AM 07/21/2019   10:24 AM 10/22/2018    3:40 PM 07/19/2018    3:56 PM  Fall Risk   Falls in the past year? 0 0 0 0 No  Number falls in past yr: 0 0     Injury with Fall? 0 0     Follow up  Falls evaluation completed Falls evaluation completed Falls evaluation completed       Functional Status Survey:      Assessment & Plan  Problem List Items Addressed This Visit       Cardiovascular and Mediastinum   Essential hypertension     Chronic, historic condition She is taking Lisinopril 5 mg PO QD and appears to be tolerating well  BP today was a bit low but previous BP measures from recent office visits appear in goal and she does not mention BP concerns today Recommend she continue Lisinopril at this time for kidney benefit - continue to monitor BP and return to PCP for concerns.       Relevant Orders   CBC w/Diff/Platelet (Completed)   COMPLETE METABOLIC PANEL WITH GFR (Completed)     Endocrine   Type 2 diabetes mellitus with other specified complication (HCC) - Primary    Chronic, ongoing condition Reports she has been having high glucose readings lately - suspected this was due to recent steroid injections with Orthopedics Recheck A1c today- 11.4  She reports excellent compliance with Trulicity 1.5 mg/ dose once weekly and Metformin 500 mg PO BID  Recommend that we increase dose of Trulicity to 3.0 mg/ dose once weekly at this time May need to increase  Metformin to 1000 mg PO BID as well but would prefer to change one at a time  Recommend follow up in 3 months with PCP for monitoring and A1c recheck      Relevant Orders   HgB A1c (Completed)   Urine Microalbumin w/creat. ratio (Completed)     Return in about 3 months (around 11/25/2022) for Diabetes follow up.   I, Ziere Docken E Meleena Munroe, PA-C, have reviewed all documentation for this visit. The documentation on 08/26/22 for the exam, diagnosis, procedures, and orders are all accurate and complete.   Talitha Givens, MHS, PA-C [facility]

## 2022-08-25 NOTE — Patient Instructions (Signed)
At this time please do the following:  Stay well hydrated and try to avoid excess sugar Eat meals with protein, vegetables and a complex carbohydrate like brown rice, sweet potatoes, or whole grains This can help with the blood sugar spikes  We will keep you updated on the results of your labs and if we need to make any medication changes  It was nice to meet you and I appreciate the opportunity to be involved in your care If you were satisfied with the care you received from me, I would greatly appreciate you saying so in the after-visit survey that is sent out following our visit.

## 2022-08-26 LAB — COMPLETE METABOLIC PANEL WITH GFR
AG Ratio: 2 (calc) (ref 1.0–2.5)
ALT: 15 U/L (ref 6–29)
AST: 11 U/L (ref 10–35)
Albumin: 4.5 g/dL (ref 3.6–5.1)
Alkaline phosphatase (APISO): 73 U/L (ref 37–153)
BUN: 22 mg/dL (ref 7–25)
CO2: 24 mmol/L (ref 20–32)
Calcium: 10.1 mg/dL (ref 8.6–10.4)
Chloride: 102 mmol/L (ref 98–110)
Creat: 1.01 mg/dL (ref 0.50–1.05)
Globulin: 2.2 g/dL (calc) (ref 1.9–3.7)
Glucose, Bld: 345 mg/dL — ABNORMAL HIGH (ref 65–139)
Potassium: 4.2 mmol/L (ref 3.5–5.3)
Sodium: 136 mmol/L (ref 135–146)
Total Bilirubin: 0.8 mg/dL (ref 0.2–1.2)
Total Protein: 6.7 g/dL (ref 6.1–8.1)
eGFR: 63 mL/min/{1.73_m2} (ref >=60)

## 2022-08-26 LAB — CBC WITH DIFFERENTIAL/PLATELET
Absolute Monocytes: 442 cells/uL (ref 200–950)
Basophils Absolute: 33 cells/uL (ref 0–200)
Basophils Relative: 0.5 %
Eosinophils Absolute: 33 cells/uL (ref 15–500)
Eosinophils Relative: 0.5 %
HCT: 37 % (ref 35.0–45.0)
Hemoglobin: 12.3 g/dL (ref 11.7–15.5)
Lymphs Abs: 2389 cells/uL (ref 850–3900)
MCH: 29.4 pg (ref 27.0–33.0)
MCHC: 33.2 g/dL (ref 32.0–36.0)
MCV: 88.3 fL (ref 80.0–100.0)
MPV: 10.9 fL (ref 7.5–12.5)
Monocytes Relative: 6.7 %
Neutro Abs: 3703 cells/uL (ref 1500–7800)
Neutrophils Relative %: 56.1 %
Platelets: 194 10*3/uL (ref 140–400)
RBC: 4.19 10*6/uL (ref 3.80–5.10)
RDW: 13.1 % (ref 11.0–15.0)
Total Lymphocyte: 36.2 %
WBC: 6.6 10*3/uL (ref 3.8–10.8)

## 2022-08-26 LAB — MICROALBUMIN / CREATININE URINE RATIO
Creatinine, Urine: 246 mg/dL (ref 20–275)
Microalb Creat Ratio: 22 mcg/mg creat (ref <30)
Microalb, Ur: 5.4 mg/dL

## 2022-08-26 LAB — HEMOGLOBIN A1C
Hgb A1c MFr Bld: 11.3 % of total Hgb — ABNORMAL HIGH (ref <5.7)
Mean Plasma Glucose: 278 mg/dL
eAG (mmol/L): 15.4 mmol/L

## 2022-08-26 MED ORDER — TRULICITY 3 MG/0.5ML ~~LOC~~ SOAJ: 3.0000 mg | SUBCUTANEOUS | 1 refills | Status: DC

## 2022-08-26 NOTE — Assessment & Plan Note (Addendum)
Chronic, historic condition She is taking Lisinopril 5 mg PO QD and appears to be tolerating well  BP today was a bit low but previous BP measures from recent office visits appear in goal and she does not mention BP concerns today Recommend she continue Lisinopril at this time for kidney benefit - continue to monitor BP and return to PCP for concerns.

## 2022-08-26 NOTE — Assessment & Plan Note (Signed)
Chronic, ongoing condition Reports she has been having high glucose readings lately - suspected this was due to recent steroid injections with Orthopedics Recheck A1c today- 11.4  She reports excellent compliance with Trulicity 1.5 mg/ dose once weekly and Metformin 500 mg PO BID  Recommend that we increase dose of Trulicity to 3.0 mg/ dose once weekly at this time May need to increase Metformin to 1000 mg PO BID as well but would prefer to change one at a time  Recommend follow up in 3 months with PCP for monitoring and A1c recheck

## 2022-08-28 ENCOUNTER — Telehealth: Payer: Self-pay | Admitting: *Deleted

## 2022-08-28 NOTE — Telephone Encounter (Signed)
Patient called for lab results and notified:  Your A1c was severely elevated at 11.3 I recommend that we increase your Trulicity to the 3 mg dose once per week- please start the new dose on your next scheduled injection day and proceed according to your normal schedule Your CBC was norma and did not show signs of anemia or infection Your electrolytes, liver and kidney enzymes were in normal ranges as well I would like you to come back in 3 months to meet with your PCP so they can recheck your A1c and see if there are further changes that need to be made to your medications.  Patient is at work now- she will call when she gets off to schedule follow up appointment

## 2022-09-06 ENCOUNTER — Other Ambulatory Visit: Payer: Self-pay | Admitting: Family Medicine

## 2022-09-06 DIAGNOSIS — F3342 Major depressive disorder, recurrent, in full remission: Secondary | ICD-10-CM

## 2022-09-06 DIAGNOSIS — I10 Essential (primary) hypertension: Secondary | ICD-10-CM

## 2022-09-06 DIAGNOSIS — E1169 Type 2 diabetes mellitus with other specified complication: Secondary | ICD-10-CM

## 2022-09-08 NOTE — Telephone Encounter (Signed)
Requested Prescriptions  Pending Prescriptions Disp Refills   sertraline (ZOLOFT) 100 MG tablet [Pharmacy Med Name: Sertraline HCl 100 MG Oral Tablet] 135 tablet 1    Sig: TAKE 1 & 1/2 (ONE & ONE-HALF) TABLETS BY MOUTH AT BEDTIME     Psychiatry:  Antidepressants - SSRI - sertraline Failed - 09/06/2022 10:27 AM      Failed - Completed PHQ-2 or PHQ-9 in the last 360 days      Passed - AST in normal range and within 360 days    AST  Date Value Ref Range Status  08/25/2022 11 10 - 35 U/L Final   SGOT(AST)  Date Value Ref Range Status  10/29/2014 20 15 - 37 Unit/L Final         Passed - ALT in normal range and within 360 days    ALT  Date Value Ref Range Status  08/25/2022 15 6 - 29 U/L Final   SGPT (ALT)  Date Value Ref Range Status  10/29/2014 27 U/L Final    Comment:    14-63 NOTE: New Reference Range 05/16/14          Passed - Valid encounter within last 6 months    Recent Outpatient Visits           2 weeks ago Type 2 diabetes mellitus with other specified complication, without long-term current use of insulin ([place])   [facility] Mecum, Dani Gobble, [place]   12 months ago Gross hematuria   [place], DO   12 months ago Hematuria, unspecified type   [place], DO   1 year ago Annual physical exam   [place], DO   1 year ago Asymptomatic varicose veins of right lower extremity   [facility], Devonne Doughty, DO               rosuvastatin (CRESTOR) 20 MG tablet [Pharmacy Med Name: Rosuvastatin Calcium 20 MG Oral Tablet] 90 tablet 3    Sig: TAKE 1 TABLET BY MOUTH AT BEDTIME     Cardiovascular:  Antilipid - Statins 2 Failed - 09/06/2022 10:27 AM      Failed - Lipid Panel in normal range within the last 12 months    Cholesterol, Total  Date Value Ref Range Status  03/20/2016 126 100 - 199 mg/dL Final    Cholesterol  Date Value Ref Range Status  08/26/2021 120 <200 mg/dL Final   LDL Cholesterol (Calc)  Date Value Ref Range Status  08/26/2021 42 mg/dL (calc) Final    Comment:    Reference range: <100 . Desirable range <100 mg/dL for primary prevention;   <70 mg/dL for patients with CHD or diabetic patients  with > or = 2 CHD risk factors. Marland Kitchen LDL-C is now calculated using the Martin-Hopkins  calculation, which is a validated novel method providing  better accuracy than the Friedewald equation in the  estimation of LDL-C.  Cresenciano Genre et al. Annamaria Helling. 7829;562(13): 2061-2068  (http://education.[employer].com/faq/FAQ164)    HDL  Date Value Ref Range Status  08/26/2021 62 > OR = 50 mg/dL Final  03/20/2016 58 >39 mg/dL Final   Triglycerides  Date Value Ref Range Status  08/26/2021 76 <150 mg/dL Final         Passed - Cr in normal range and within 360 days    Creat  Date Value Ref Range Status  08/25/2022 1.01 0.50 -  1.05 mg/dL Final   Creatinine, Urine  Date Value Ref Range Status  08/25/2022 246 20 - 275 mg/dL Final         Passed - Patient is not pregnant      Passed - Valid encounter within last 12 months    Recent Outpatient Visits           2 weeks ago Type 2 diabetes mellitus with other specified complication, without long-term current use of insulin ([place])   [facility] Mecum, Easton E, [place]   12 months ago Gross hematuria   [place], DO   12 months ago Hematuria, unspecified type   [place], Devonne Doughty, DO   1 year ago Annual physical exam   [facility] Olin Hauser, DO   1 year ago Asymptomatic varicose veins of right lower extremity   [place], DO               lisinopril (ZESTRIL) 5 MG tablet [Pharmacy Med Name: Lisinopril 5 MG Oral Tablet] 90 tablet 1    Sig: Take 1 tablet by mouth  once daily     Cardiovascular:  ACE Inhibitors Failed - 09/06/2022 10:27 AM      Failed - Last BP in normal range    BP Readings from Last 1 Encounters:  08/25/22 (!) 87/51         Passed - Cr in normal range and within 180 days    Creat  Date Value Ref Range Status  08/25/2022 1.01 0.50 - 1.05 mg/dL Final   Creatinine, Urine  Date Value Ref Range Status  08/25/2022 246 20 - 275 mg/dL Final         Passed - K in normal range and within 180 days    Potassium  Date Value Ref Range Status  08/25/2022 4.2 3.5 - 5.3 mmol/L Final  10/29/2014 4.5 3.5 - 5.1 mmol/L Final         Passed - Patient is not pregnant      Passed - Valid encounter within last 6 months    Recent Outpatient Visits           2 weeks ago Type 2 diabetes mellitus with other specified complication, without long-term current use of insulin ([place])   [facility] Mecum, Dani Gobble, [place]   12 months ago Gross hematuria   [place], DO   12 months ago Hematuria, unspecified type   [place], DO   1 year ago Annual physical exam   [place], DO   1 year ago Asymptomatic varicose veins of right lower extremity   [place], Devonne Doughty, DO

## 2022-09-15 ENCOUNTER — Ambulatory Visit: Payer: BC Managed Care – PPO | Admitting: Podiatry

## 2022-09-15 ENCOUNTER — Encounter: Payer: Self-pay | Admitting: Podiatry

## 2022-09-15 DIAGNOSIS — L600 Ingrowing nail: Secondary | ICD-10-CM | POA: Diagnosis not present

## 2022-09-15 DIAGNOSIS — L03031 Cellulitis of right toe: Secondary | ICD-10-CM | POA: Diagnosis not present

## 2022-09-15 DIAGNOSIS — D2371 Other benign neoplasm of skin of right lower limb, including hip: Secondary | ICD-10-CM | POA: Diagnosis not present

## 2022-09-15 MED ORDER — NEOMYCIN-POLYMYXIN-HC 1 % OT SOLN: OTIC | 1 refills | Status: DC

## 2022-09-15 NOTE — Progress Notes (Signed)
She presents today chief complaint of a painful forefoot right states is a small callused area there she is concerned about.  States that her blood sugar has been running very high since the shots in her back.  States that she has bad sciatic type pain.  She also complaining of a painful ingrown toenail to the fibular border the hallux right.  Objective: Vital signs are stable alert oriented x3.  There is no erythema edema cellulitis drainage or odor.  Pulses are palpable.  She has a sharp incurvated nail margin along the fibular border of the hallux right.  Solitary porokeratotic lesion no open lesions or wounds.  Assessment: Diabetes mellitus with an A1c of 11.9.  Ingrown toenail fibular border hallux right.  Benign skin lesion.  Plan: Discussed etiology pathology conservative surgical therapies at this point performed an I&D to the fibular border today no phenol was applied.  Did not nucleated the lesion lesion today subsecond metatarsophalangeal joint right foot.  We will follow-up with her in 2 to 3 weeks just to make sure she is healing well.

## 2022-09-15 NOTE — Patient Instructions (Addendum)

## 2022-09-23 DIAGNOSIS — M5416 Radiculopathy, lumbar region: Secondary | ICD-10-CM | POA: Diagnosis not present

## 2022-09-24 ENCOUNTER — Ambulatory Visit: Payer: BC Managed Care – PPO | Admitting: Family Medicine

## 2022-09-24 ENCOUNTER — Encounter: Payer: Self-pay | Admitting: Family Medicine

## 2022-09-24 VITALS — BP 105/55 | HR 82 | Ht 65.0 in | Wt 168.0 lb

## 2022-09-24 DIAGNOSIS — M5136 Other intervertebral disc degeneration, lumbar region: Secondary | ICD-10-CM | POA: Diagnosis not present

## 2022-09-24 DIAGNOSIS — E1169 Type 2 diabetes mellitus with other specified complication: Secondary | ICD-10-CM | POA: Diagnosis not present

## 2022-09-24 DIAGNOSIS — G6289 Other specified polyneuropathies: Secondary | ICD-10-CM | POA: Diagnosis not present

## 2022-09-24 MED ORDER — METFORMIN HCL ER 500 MG PO TB24: 500.0000 mg | ORAL_TABLET | Freq: Two times a day (BID) | ORAL | 3 refills | Status: DC

## 2022-09-24 NOTE — Progress Notes (Signed)
Subjective:    Patient ID: Andrea Mcdonald, female    DOB: 01-30-1961, 61 y.o.   MRN: [id number]  Andrea Mcdonald is a 60 y.o. female presenting on 09/24/2022 for Cold Extremity and Diabetes   HPI  Lumbar Spondylolthisesis  Followed by [facility] Followed by Dr Sabra Heck, and also has seen spine specialist for injection with spinal steroid injection Dr Kayleen Memos it ran up her blood sugar >500. She was seen at ED 08/18/22 for Hyperglycemia.  She is already on Gabapentin for her back, with low back pain and radiating sciatica pain into her R leg and groin and into her RLE into lower leg calf. - She has been referred to Physical therapy, limited results - She asks for referral for 2nd opinion to other Neurosurgery - She has known history of multiple discs in her back that have had problem. - She has taken Meloxicam, Flexeril, and Steroid pak for 12 days with significant temporary relief but it raised her blood sugar  Podiatry Has seen for ingrown toenail Also issue with some callus formation that was shaved She had good pulse circulation in toes but she has cold sensation in toes and asked to follow-up with me.  Neuropathy Cold Toes sensation Sensation is warm on exam but she feels cold Circulation is intact based on prior exam She has seen vascular before.  Type 2 Diabetes Last A1c 11.3 1 month ago, following steroid injection See above hyperglycemia - Interval update last visit 08/25/22 seen for HFU, she was increased Trulicity from 1.5 up to 3.0 mg about 1 month ago. - Currently improved with avg CBG 200s Doing better with sugars now       07/23/2020   10:37 AM 07/21/2019   10:24 AM 01/21/2019    3:44 PM  Depression screen PHQ 2/9  Decreased Interest 0 0 0  Down, Depressed, Hopeless 0 0 1  PHQ - 2 Score 0 0 1  Altered sleeping 0 0 1  Tired, decreased energy 0 1 0  Change in appetite 0 1 1  Feeling bad or failure about yourself  0 0 0  Trouble  concentrating 0 1 2  Moving slowly or fidgety/restless 0 0 0  Suicidal thoughts 0 0 0  PHQ-9 Score 0 3 5  Difficult doing work/chores Not difficult at all Not difficult at all Not difficult at all      07/23/2020   10:37 AM 07/21/2019   10:42 AM 01/21/2019    3:48 PM 10/22/2018    3:42 PM  GAD 7 : Generalized Anxiety Score  Nervous, Anxious, on Edge _0 Control/stop worrying _1 Worry too much - different things _2 Trouble relaxing 0 _3 Restless 0 _4 Easily annoyed or irritable 0 _5 Afraid - awful might happen 0 _6 Total GAD 7 Score _7 Anxiety Difficulty Not difficult at all Not difficult at all Not difficult at all Not difficult at all      Social History   Tobacco Use   Smoking status: Never   Smokeless tobacco: Never  Vaping Use   Vaping Use: Never used  Substance Use Topics   Alcohol use: Yes    Comment: drink occ   Drug use: No    Review of Systems Per HPI unless specifically indicated above     Objective:  BP (!) 105/55   Pulse 82   Ht _0  (1.651 m)   Wt 168 lb (76.2 kg)   SpO2 99%   BMI 27.96 kg/m   Wt Readings from Last 3 Encounters:  09/24/22 168 lb (76.2 kg)  08/25/22 170 lb (77.1 kg)  08/18/22 175 lb (79.4 kg)    Physical Exam Vitals and nursing note reviewed.  Constitutional:      General: She is not in acute distress.    Appearance: Normal appearance. She is well-developed. She is not diaphoretic.     Comments: Well-appearing, comfortable, cooperative  HENT:     Head: Normocephalic and atraumatic.  Eyes:     General:        Right eye: No discharge.        Left eye: No discharge.     Conjunctiva/sclera: Conjunctivae normal.  Cardiovascular:     Rate and Rhythm: Normal rate.  Pulmonary:     Effort: Pulmonary effort is normal.  Musculoskeletal:     Comments: Right lower extremity foot exam see below, warm to touch, circulation pulse distal intact, some varicose veins, normal cap refill.   Skin:    General: Skin is warm and dry.     Findings: No erythema or rash.  Neurological:     Mental Status: She is alert and oriented to person, place, and time.  Psychiatric:        Mood and Affect: Mood normal.        Behavior: Behavior normal.        Thought Content: Thought content normal.     Comments: Well groomed, good eye contact, normal speech and thoughts     Diabetic Foot Exam - Simple   Simple Foot Form Diabetic Foot exam was performed with the following findings: Yes 09/24/2022  4:12 PM  Visual Inspection No deformities, no ulcerations, no other skin breakdown bilaterally: Yes Sensation Testing Intact to touch and monofilament testing bilaterally: Yes Pulse Check Posterior Tibialis and Dorsalis pulse intact bilaterally: Yes Comments      Results for orders placed or performed in visit on 08/25/22  HgB A1c  Result Value Ref Range   Hgb A1c MFr Bld 11.3 (H) <5.7 % of total Hgb   Mean Plasma Glucose 278 mg/dL   eAG (mmol/L) 15.4 mmol/L  CBC w/Diff/Platelet  Result Value Ref Range   WBC 6.6 3.8 - 10.8 Thousand/uL   RBC 4.19 3.80 - 5.10 Million/uL   Hemoglobin 12.3 11.7 - 15.5 g/dL   HCT 37.0 35.0 - 45.0 %   MCV 88.3 80.0 - 100.0 fL   MCH 29.4 27.0 - 33.0 pg   MCHC 33.2 32.0 - 36.0 g/dL   RDW 13.1 11.0 - 15.0 %   Platelets 194 140 - 400 Thousand/uL   MPV 10.9 7.5 - 12.5 fL   Neutro Abs 3,703 1,500 - 7,800 cells/uL   Lymphs Abs 2,389 850 - 3,900 cells/uL   Absolute Monocytes 442 200 - 950 cells/uL   Eosinophils Absolute 33 15 - 500 cells/uL   Basophils Absolute 33 0 - 200 cells/uL   Neutrophils Relative % 56.1 %   Total Lymphocyte 36.2 %   Monocytes Relative 6.7 %   Eosinophils Relative 0.5 %   Basophils Relative 0.5 %  COMPLETE METABOLIC PANEL WITH GFR  Result Value Ref Range   Glucose, Bld 345 (H) 65 - 139 mg/dL   BUN 22 7 - 25 mg/dL   Creat 1.01 0.50 - 1.05 mg/dL   eGFR  63 > OR = 60 mL/min/1.46m   BUN/Creatinine Ratio SEE NOTE: 6 - 22 (calc)    Sodium 136 135 - 146 mmol/L   Potassium 4.2 3.5 - 5.3 mmol/L   Chloride 102 98 - 110 mmol/L   CO2 24 20 - 32 mmol/L   Calcium 10.1 8.6 - 10.4 mg/dL   Total Protein 6.7 6.1 - 8.1 g/dL   Albumin 4.5 3.6 - 5.1 g/dL   Globulin 2.2 1.9 - 3.7 g/dL (calc)   AG Ratio 2.0 1.0 - 2.5 (calc)   Total Bilirubin 0.8 0.2 - 1.2 mg/dL   Alkaline phosphatase (APISO) 73 37 - 153 U/L   AST 11 10 - 35 U/L   ALT 15 6 - 29 U/L  Urine Microalbumin w/creat. ratio  Result Value Ref Range   Creatinine, Urine 246 20 - 275 mg/dL   Microalb, Ur 5.4 mg/dL   Microalb Creat Ratio 22 <30 mcg/mg creat      Assessment & Plan:   Problem List Items Addressed This Visit     Type 2 diabetes mellitus with other specified complication ([place] - Primary   Relevant Medications   metFORMIN (GLUCOPHAGE-XR) 500 MG 24 hr tablet   Other Visit Diagnoses     Other polyneuropathy       DDD (degenerative disc disease), lumbar           Most likely the scenario is nerve damage or neuropathy, from high sugar plus the back / spine. The cold sensation can be caused by the sensory aspect of the nerve. It is common but hard to treat with medication.   Keep on Gabapentin for nerve pain  I agree with 2nd opinion from Neurosurgery for spine if not improved on injection.  I recommend Alpha Lipoic Acid nerve supplement twice a day OTC to help promote nerve health.  Recent Labs    08/25/22 1625  HGBA1C 103.5   Keep on Trulicity 3.0 mg weekly injection  Future we can consider switching to the other options Mounjaro   Meds ordered this encounter  Medications   metFORMIN (GLUCOPHAGE-XR) 500 MG 24 hr tablet    Sig: Take 1 tablet (500 mg total) by mouth 2 (two) times daily with a meal.    Dispense:  180 tablet    Refill:  3      Follow up plan: Return in about 3 months (around 12/25/2022) for 3 month DM A1c, Neuropathy/Back, needs latest apt in day possible.   ANobie Putnam DO [facility] 09/24/2022, 4:16 PM

## 2022-09-24 NOTE — Patient Instructions (Addendum)
Thank you for coming to the office today.  Most likely the scenario is nerve damage or neuropathy, from high sugar plus the back / spine. The cold sensation can be caused by the sensory aspect of the nerve. It is common but hard to treat with medication.   Keep on Gabapentin for nerve pain  I agree with 2nd opinion from Neurosurgery for spine if not improved on injection.  I recommend Alpha Lipoic Acid nerve supplement twice a day OTC to help promote nerve health.  Recent Labs    08/25/22 1625  HGBA1C 60.6*   Keep on Trulicity 3.0 mg weekly injection  Future we can consider switching to the other options Mounjaro  Please schedule a Follow-up Appointment to: Return in about 3 months (around 12/25/2022) for 3 month DM A1c, Neuropathy/Back, needs latest apt in day possible.  If you have any other questions or concerns, please feel free to call the office or send a message through [place]. You may also schedule an earlier appointment if necessary.  Additionally, you may be receiving a survey about your experience at our office within a few days to 1 week by e-mail or mail. We value your feedback.  Nobie Putnam, DO [place]

## 2022-09-29 DIAGNOSIS — M5416 Radiculopathy, lumbar region: Secondary | ICD-10-CM | POA: Diagnosis not present

## 2022-09-30 DIAGNOSIS — M25511 Pain in right shoulder: Secondary | ICD-10-CM | POA: Diagnosis not present

## 2022-10-08 ENCOUNTER — Ambulatory Visit: Payer: BC Managed Care – PPO | Admitting: Podiatry

## 2022-10-08 DIAGNOSIS — M75111 Incomplete rotator cuff tear or rupture of right shoulder, not specified as traumatic: Secondary | ICD-10-CM | POA: Diagnosis not present

## 2022-10-10 ENCOUNTER — Other Ambulatory Visit: Payer: Self-pay | Admitting: Orthopedic Surgery

## 2022-10-10 DIAGNOSIS — M75121 Complete rotator cuff tear or rupture of right shoulder, not specified as traumatic: Secondary | ICD-10-CM | POA: Diagnosis not present

## 2022-10-13 ENCOUNTER — Encounter
Admission: RE | Admit: 2022-10-13 | Discharge: 2022-10-13 | Disposition: A | Discharge status: Home / Self Care | Payer: PRIVATE HEALTH INSURANCE | Source: Ambulatory Visit | Attending: Orthopedic Surgery | Admitting: Orthopedic Surgery

## 2022-10-13 VITALS — Ht 65.0 in | Wt 170.0 lb

## 2022-10-13 DIAGNOSIS — E1169 Type 2 diabetes mellitus with other specified complication: Secondary | ICD-10-CM

## 2022-10-13 DIAGNOSIS — I10 Essential (primary) hypertension: Secondary | ICD-10-CM

## 2022-10-13 NOTE — Patient Instructions (Signed)
Your procedure is scheduled on: Thursday October 16, 2022. Report to Day Surgery inside [facility] 2nd floor, stop by registration desk before getting on elevator.  To find out your arrival time please call [phone] between 1PM - 3PM on Wednesday October 15, 2022.  Remember: Instructions that are not followed completely may result in serious medical risk,  up to and including death, or upon the discretion of your surgeon and anesthesiologist your  surgery may need to be rescheduled.     _X__ 1. Do not eat food or drink fluids after midnight the night before your procedure.                 No chewing gum or hard candies.   __X__2.  On the morning of surgery brush your teeth with toothpaste and water, you                may rinse your mouth with mouthwash if you wish.  Do not swallow any toothpaste or mouthwash.     _X__ 3.  No Alcohol for 24 hours before or after surgery.   _X__ 4.  Do Not Smoke or use e-cigarettes For 24 Hours Prior to Your Surgery.                 Do not use any chewable tobacco products for at least 6 hours prior to                 Surgery.  _X__  5.  Do not use any recreational drugs (marijuana, cocaine, heroin, ecstasy, MDMA or other)                For at least one week prior to your surgery.  Combination of these drugs with anesthesia                May have life threatening results.  ____  6.  Bring all medications with you on the day of surgery if instructed.   __X_ 7.  Notify your doctor if there is any change in your medical condition      (cold, fever, infections).     Do not wear jewelry, make-up, hairpins, clips or nail polish. Do not wear lotions, powders, or perfumes. You may wear deodorant. Do not shave 48 hours prior to surgery. Men may shave face and neck. Do not bring valuables to the hospital.    [facility] is not responsible for any belongings or valuables.  Contacts, dentures or bridgework may not be worn  into surgery. Leave your suitcase in the car. After surgery it may be brought to your room. For patients admitted to the hospital, discharge time is determined by your treatment team.   Patients discharged the day of surgery will not be allowed to drive home.   Make arrangements for someone to be with you for the first 24 hours of your Same Day Discharge.   __X__ Take these medicines the morning of surgery with A SIP OF WATER:    1. gabapentin (NEURONTIN) 300 MG   2.   3.   4.  5.  6.  ____ Fleet Enema (as directed)   __X__ Use CHG Soap (or wipes) as directed  ____ Use Benzoyl Peroxide Gel as instructed  ____ Use inhalers on the day of surgery  __X__ Stop metformin 2 days prior to surgery Take last dose 10/13/22    __X__  Stop Dulaglutide (TRULICITY) 3 PY/1.9JK SOPN 1 week prior to your surgery.  ____ Call your PCP, cardiologist, or Pulmonologist if taking Coumadin/Plavix/aspirin and ask when to stop before your surgery.   __X__ One Week prior to surgery- Stop Anti-inflammatories such as Ibuprofen, Aleve, Advil, Motrin, meloxicam (MOBIC), diclofenac, etodolac, ketorolac, Toradol, Daypro, piroxicam, Goody's or BC powders. OK TO USE TYLENOL IF NEEDED   __X__ Stop supplements until after surgery.    ____ Bring C-Pap to the hospital.    If you have any questions regarding your pre-procedure instructions,  Please call Pre-admit Testing at [phone]    Preparing for Surgery with CHLORHEXIDINE GLUCONATE (CHG) Soap  Chlorhexidine Gluconate (CHG) Soap  o An antiseptic cleaner that kills germs and bonds with the skin to continue killing germs even after washing  o Used for showering the night before surgery and morning of surgery  Before surgery, you can play an important role by reducing the number of germs on your skin.  CHG (Chlorhexidine gluconate) soap is an antiseptic cleanser which kills germs and bonds with the skin to continue killing germs even after  washing.  Please do not use if you have an allergy to CHG or antibacterial soaps. If your skin becomes reddened/irritated stop using the CHG.  1. Shower the NIGHT BEFORE SURGERY and the MORNING OF SURGERY with CHG soap.  2. If you choose to wash your hair, wash your hair first as usual with your normal shampoo.  3. After shampooing, rinse your hair and body thoroughly to remove the shampoo.  4. Use CHG as you would any other liquid soap. You can apply CHG directly to the skin and wash gently with a scrungie or a clean washcloth.  5. Apply the CHG soap to your body only from the neck down. Do not use on open wounds or open sores. Avoid contact with your eyes, ears, mouth, and genitals (private parts). Wash face and genitals (private parts) with your normal soap.  6. Wash thoroughly, paying special attention to the area where your surgery will be performed.  7. Thoroughly rinse your body with warm water.  8. Do not shower/wash with your normal soap after using and rinsing off the CHG soap.  9. Pat yourself dry with a clean towel.  10. Wear clean pajamas to bed the night before surgery.  12. Place clean sheets on your bed the night of your first shower and do not sleep with pets.  13. Shower again with the CHG soap on the day of surgery prior to arriving at the hospital.  14. Do not apply any deodorants/lotions/powders.  15. Please wear clean clothes to the hospital.

## 2022-10-14 ENCOUNTER — Encounter
Admission: RE | Admit: 2022-10-14 | Discharge: 2022-10-14 | Disposition: A | Discharge status: Home / Self Care | Payer: BC Managed Care – PPO | Source: Ambulatory Visit | Attending: Orthopedic Surgery | Admitting: Orthopedic Surgery

## 2022-10-14 DIAGNOSIS — M75121 Complete rotator cuff tear or rupture of right shoulder, not specified as traumatic: Secondary | ICD-10-CM | POA: Diagnosis not present

## 2022-10-14 DIAGNOSIS — I10 Essential (primary) hypertension: Secondary | ICD-10-CM | POA: Insufficient documentation

## 2022-10-14 DIAGNOSIS — M25811 Other specified joint disorders, right shoulder: Secondary | ICD-10-CM | POA: Diagnosis not present

## 2022-10-14 DIAGNOSIS — E7800 Pure hypercholesterolemia, unspecified: Secondary | ICD-10-CM | POA: Diagnosis not present

## 2022-10-14 DIAGNOSIS — I872 Venous insufficiency (chronic) (peripheral): Secondary | ICD-10-CM | POA: Diagnosis not present

## 2022-10-14 DIAGNOSIS — Z01810 Encounter for preprocedural cardiovascular examination: Secondary | ICD-10-CM | POA: Insufficient documentation

## 2022-10-14 DIAGNOSIS — Z90711 Acquired absence of uterus with remaining cervical stump: Secondary | ICD-10-CM | POA: Diagnosis not present

## 2022-10-14 DIAGNOSIS — M19011 Primary osteoarthritis, right shoulder: Secondary | ICD-10-CM | POA: Diagnosis not present

## 2022-10-14 DIAGNOSIS — Z9882 Breast implant status: Secondary | ICD-10-CM | POA: Diagnosis not present

## 2022-10-14 DIAGNOSIS — M75111 Incomplete rotator cuff tear or rupture of right shoulder, not specified as traumatic: Secondary | ICD-10-CM | POA: Diagnosis not present

## 2022-10-14 DIAGNOSIS — Z7984 Long term (current) use of oral hypoglycemic drugs: Secondary | ICD-10-CM | POA: Diagnosis not present

## 2022-10-14 DIAGNOSIS — E1165 Type 2 diabetes mellitus with hyperglycemia: Secondary | ICD-10-CM | POA: Diagnosis not present

## 2022-10-15 ENCOUNTER — Other Ambulatory Visit: Payer: PRIVATE HEALTH INSURANCE

## 2022-10-16 ENCOUNTER — Ambulatory Visit
Admission: RE | Admit: 2022-10-16 | Discharge: 2022-10-16 | Disposition: A | Discharge status: Home / Self Care | Payer: BC Managed Care – PPO | Attending: Orthopedic Surgery | Admitting: Orthopedic Surgery

## 2022-10-16 ENCOUNTER — Ambulatory Visit: Payer: BC Managed Care – PPO | Admitting: Registered Nurse

## 2022-10-16 ENCOUNTER — Ambulatory Visit: Payer: BC Managed Care – PPO

## 2022-10-16 ENCOUNTER — Encounter
Admission: RE | Disposition: A | Discharge status: Home / Self Care | Payer: Self-pay | Source: Home / Self Care | Attending: Orthopedic Surgery

## 2022-10-16 ENCOUNTER — Encounter: Payer: Self-pay | Admitting: Orthopedic Surgery

## 2022-10-16 ENCOUNTER — Other Ambulatory Visit: Payer: Self-pay

## 2022-10-16 DIAGNOSIS — E1169 Type 2 diabetes mellitus with other specified complication: Secondary | ICD-10-CM

## 2022-10-16 DIAGNOSIS — M75121 Complete rotator cuff tear or rupture of right shoulder, not specified as traumatic: Secondary | ICD-10-CM | POA: Insufficient documentation

## 2022-10-16 DIAGNOSIS — Z7984 Long term (current) use of oral hypoglycemic drugs: Secondary | ICD-10-CM | POA: Insufficient documentation

## 2022-10-16 DIAGNOSIS — M19011 Primary osteoarthritis, right shoulder: Secondary | ICD-10-CM | POA: Insufficient documentation

## 2022-10-16 DIAGNOSIS — F418 Other specified anxiety disorders: Secondary | ICD-10-CM | POA: Diagnosis not present

## 2022-10-16 DIAGNOSIS — M25811 Other specified joint disorders, right shoulder: Secondary | ICD-10-CM | POA: Diagnosis not present

## 2022-10-16 DIAGNOSIS — G8918 Other acute postprocedural pain: Secondary | ICD-10-CM | POA: Diagnosis not present

## 2022-10-16 DIAGNOSIS — Z90711 Acquired absence of uterus with remaining cervical stump: Secondary | ICD-10-CM | POA: Diagnosis not present

## 2022-10-16 DIAGNOSIS — I872 Venous insufficiency (chronic) (peripheral): Secondary | ICD-10-CM | POA: Diagnosis not present

## 2022-10-16 DIAGNOSIS — Z9882 Breast implant status: Secondary | ICD-10-CM | POA: Insufficient documentation

## 2022-10-16 DIAGNOSIS — I10 Essential (primary) hypertension: Secondary | ICD-10-CM | POA: Diagnosis not present

## 2022-10-16 DIAGNOSIS — E7800 Pure hypercholesterolemia, unspecified: Secondary | ICD-10-CM | POA: Insufficient documentation

## 2022-10-16 DIAGNOSIS — M75111 Incomplete rotator cuff tear or rupture of right shoulder, not specified as traumatic: Secondary | ICD-10-CM | POA: Insufficient documentation

## 2022-10-16 DIAGNOSIS — S46111A Strain of muscle, fascia and tendon of long head of biceps, right arm, initial encounter: Secondary | ICD-10-CM | POA: Diagnosis not present

## 2022-10-16 DIAGNOSIS — E1165 Type 2 diabetes mellitus with hyperglycemia: Secondary | ICD-10-CM | POA: Diagnosis not present

## 2022-10-16 DIAGNOSIS — M75101 Unspecified rotator cuff tear or rupture of right shoulder, not specified as traumatic: Secondary | ICD-10-CM | POA: Diagnosis not present

## 2022-10-16 DIAGNOSIS — M7541 Impingement syndrome of right shoulder: Secondary | ICD-10-CM | POA: Diagnosis not present

## 2022-10-16 HISTORY — PX: SHOULDER ARTHROSCOPY WITH OPEN ROTATOR CUFF REPAIR AND DISTAL CLAVICLE ACROMINECTOMY: SHX5683

## 2022-10-16 HISTORY — PX: BICEPT TENODESIS: SHX5116

## 2022-10-16 LAB — GLUCOSE, CAPILLARY
Glucose-Capillary: 141 mg/dL — ABNORMAL HIGH (ref 70–99)
Glucose-Capillary: 180 mg/dL — ABNORMAL HIGH (ref 70–99)

## 2022-10-16 SURGERY — SHOULDER ARTHROSCOPY WITH OPEN ROTATOR CUFF REPAIR AND DISTAL CLAVICLE ACROMINECTOMY
Anesthesia: General | Site: Shoulder | Laterality: Right

## 2022-10-16 MED ORDER — PROPOFOL 10 MG/ML IV BOLUS
INTRAVENOUS | Status: AC
  Filled 2022-10-16: qty 40

## 2022-10-16 MED ORDER — ORAL CARE MOUTH RINSE: 15.0000 mL | Freq: Once | OROMUCOSAL | Status: AC

## 2022-10-16 MED ORDER — CHLORHEXIDINE GLUCONATE CLOTH 2 % EX PADS
6.0000 | MEDICATED_PAD | Freq: Once | CUTANEOUS | Status: AC
  Administered 2022-10-16: 6 via TOPICAL

## 2022-10-16 MED ORDER — CHLORHEXIDINE GLUCONATE 0.12 % MT SOLN
OROMUCOSAL | Status: AC
  Administered 2022-10-16: 15 mL via OROMUCOSAL
  Filled 2022-10-16: qty 15

## 2022-10-16 MED ORDER — PROPOFOL 10 MG/ML IV BOLUS
INTRAVENOUS | Status: DC | PRN
  Administered 2022-10-16: 160 mg via INTRAVENOUS
  Administered 2022-10-16: 40 mg via INTRAVENOUS

## 2022-10-16 MED ORDER — LACTATED RINGERS IR SOLN
Status: DC | PRN
  Administered 2022-10-16 (×4): 3001 mL

## 2022-10-16 MED ORDER — FENTANYL CITRATE (PF) 100 MCG/2ML IJ SOLN
INTRAMUSCULAR | Status: AC
  Filled 2022-10-16: qty 2

## 2022-10-16 MED ORDER — PHENYLEPHRINE 80 MCG/ML (10ML) SYRINGE FOR IV PUSH (FOR BLOOD PRESSURE SUPPORT)
PREFILLED_SYRINGE | INTRAVENOUS | Status: AC
  Filled 2022-10-16: qty 10

## 2022-10-16 MED ORDER — ONDANSETRON HCL 4 MG/2ML IJ SOLN
INTRAMUSCULAR | Status: DC | PRN
  Administered 2022-10-16: 4 mg via INTRAVENOUS

## 2022-10-16 MED ORDER — ACETAMINOPHEN 500 MG PO TABS: 1000.0000 mg | ORAL_TABLET | ORAL | Status: AC

## 2022-10-16 MED ORDER — SUCCINYLCHOLINE CHLORIDE 200 MG/10ML IV SOSY
PREFILLED_SYRINGE | INTRAVENOUS | Status: DC | PRN
  Administered 2022-10-16: 100 mg via INTRAVENOUS

## 2022-10-16 MED ORDER — SUCCINYLCHOLINE CHLORIDE 200 MG/10ML IV SOSY
PREFILLED_SYRINGE | INTRAVENOUS | Status: AC
  Filled 2022-10-16: qty 10

## 2022-10-16 MED ORDER — ACETAMINOPHEN 500 MG PO TABS
ORAL_TABLET | ORAL | Status: AC
  Administered 2022-10-16: 1000 mg via ORAL
  Filled 2022-10-16: qty 2

## 2022-10-16 MED ORDER — PHENYLEPHRINE HCL-NACL 20-0.9 MG/250ML-% IV SOLN
INTRAVENOUS | Status: AC
  Filled 2022-10-16: qty 250

## 2022-10-16 MED ORDER — MIDAZOLAM HCL 2 MG/2ML IJ SOLN
INTRAMUSCULAR | Status: AC
  Filled 2022-10-16: qty 2

## 2022-10-16 MED ORDER — BUPIVACAINE HCL (PF) 0.5 % IJ SOLN
INTRAMUSCULAR | Status: AC
  Filled 2022-10-16: qty 10

## 2022-10-16 MED ORDER — LACTATED RINGERS IV SOLN: INTRAVENOUS | Status: DC

## 2022-10-16 MED ORDER — PHENYLEPHRINE HCL-NACL 20-0.9 MG/250ML-% IV SOLN
INTRAVENOUS | Status: DC | PRN
  Administered 2022-10-16: 30 ug/min via INTRAVENOUS

## 2022-10-16 MED ORDER — BUPIVACAINE HCL (PF) 0.5 % IJ SOLN
INTRAMUSCULAR | Status: DC | PRN
  Administered 2022-10-16: 10 mL

## 2022-10-16 MED ORDER — CEFAZOLIN SODIUM-DEXTROSE 2-4 GM/100ML-% IV SOLN
2.0000 g | INTRAVENOUS | Status: AC
  Administered 2022-10-16: 2 g via INTRAVENOUS

## 2022-10-16 MED ORDER — BUPIVACAINE LIPOSOME 1.3 % IJ SUSP
INTRAMUSCULAR | Status: AC
  Filled 2022-10-16: qty 10

## 2022-10-16 MED ORDER — MIDAZOLAM HCL 5 MG/5ML IJ SOLN
INTRAMUSCULAR | Status: DC | PRN
  Administered 2022-10-16: 1 mg via INTRAVENOUS
  Administered 2022-10-16: 2 mg via INTRAVENOUS
  Administered 2022-10-16: 1 mg via INTRAVENOUS

## 2022-10-16 MED ORDER — CEFAZOLIN SODIUM-DEXTROSE 2-4 GM/100ML-% IV SOLN
INTRAVENOUS | Status: AC
  Filled 2022-10-16: qty 100

## 2022-10-16 MED ORDER — CHLORHEXIDINE GLUCONATE 0.12 % MT SOLN: 15.0000 mL | Freq: Once | OROMUCOSAL | Status: AC

## 2022-10-16 MED ORDER — EPINEPHRINE PF 1 MG/ML IJ SOLN
INTRAMUSCULAR | Status: AC
  Filled 2022-10-16: qty 4

## 2022-10-16 MED ORDER — FENTANYL CITRATE (PF) 100 MCG/2ML IJ SOLN
INTRAMUSCULAR | Status: DC | PRN
  Administered 2022-10-16 (×2): 25 ug via INTRAVENOUS
  Administered 2022-10-16: 50 ug via INTRAVENOUS

## 2022-10-16 MED ORDER — FAMOTIDINE 20 MG PO TABS
ORAL_TABLET | ORAL | Status: AC
  Administered 2022-10-16: 20 mg via ORAL
  Filled 2022-10-16: qty 1

## 2022-10-16 MED ORDER — DEXAMETHASONE SODIUM PHOSPHATE 10 MG/ML IJ SOLN
INTRAMUSCULAR | Status: AC
  Filled 2022-10-16: qty 1

## 2022-10-16 MED ORDER — ONDANSETRON HCL 4 MG/2ML IJ SOLN
INTRAMUSCULAR | Status: AC
  Filled 2022-10-16: qty 2

## 2022-10-16 MED ORDER — MIDAZOLAM HCL 2 MG/2ML IJ SOLN
1.0000 mg | INTRAMUSCULAR | Status: AC | PRN
  Administered 2022-10-16: 1 mg via INTRAVENOUS

## 2022-10-16 MED ORDER — BUPIVACAINE LIPOSOME 1.3 % IJ SUSP
INTRAMUSCULAR | Status: DC | PRN
  Administered 2022-10-16: 10 mL via PERINEURAL

## 2022-10-16 MED ORDER — DEXAMETHASONE SODIUM PHOSPHATE 10 MG/ML IJ SOLN
INTRAMUSCULAR | Status: DC | PRN
  Administered 2022-10-16: 8 mg via INTRAVENOUS

## 2022-10-16 MED ORDER — LIDOCAINE HCL (PF) 1 % IJ SOLN
INTRAMUSCULAR | Status: AC
  Filled 2022-10-16: qty 30

## 2022-10-16 MED ORDER — MIDAZOLAM HCL 2 MG/2ML IJ SOLN
INTRAMUSCULAR | Status: AC
  Administered 2022-10-16: 1 mg via INTRAVENOUS
  Filled 2022-10-16: qty 2

## 2022-10-16 MED ORDER — BUPIVACAINE HCL (PF) 0.25 % IJ SOLN
INTRAMUSCULAR | Status: AC
  Filled 2022-10-16: qty 30

## 2022-10-16 MED ORDER — OXYCODONE HCL 5 MG PO TABS: 5.0000 mg | ORAL_TABLET | ORAL | 0 refills | Status: DC | PRN

## 2022-10-16 MED ORDER — LACTATED RINGERS IR SOLN
Status: DC | PRN
  Administered 2022-10-16 (×5): 3000 mL

## 2022-10-16 MED ORDER — FAMOTIDINE 20 MG PO TABS: 20.0000 mg | ORAL_TABLET | Freq: Once | ORAL | Status: AC

## 2022-10-16 MED ORDER — ONDANSETRON HCL 4 MG PO TABS: 4.0000 mg | ORAL_TABLET | Freq: Three times a day (TID) | ORAL | 0 refills | Status: DC | PRN

## 2022-10-16 SURGICAL SUPPLY — 71 items
ADAPTER IRRIG TUBE 2 SPIKE SOL (ADAPTER) ×2 IMPLANT
ADPR TBG 2 SPK PMP STRL ASCP (ADAPTER) ×2
ANCH SUT 5.5 KNTLS PEEK (Orthopedic Implant) ×3 IMPLANT
ANCH SUT Q-FX 2.8 (Anchor) ×1 IMPLANT
ANCHOR ALL-SUT Q-FIX 2.8 (Anchor) ×4 IMPLANT
ANCHOR SUT 5.5 MULTIFIX (Orthopedic Implant) IMPLANT
BLADE SURG 15 STRL LF DISP TIS (BLADE) IMPLANT
BLADE SURG 15 STRL SS (BLADE) ×1
CANNULA 5.75X7 CRYSTAL CLEAR (CANNULA) ×1 IMPLANT
CANNULA PARTIAL THREAD 2X7 (CANNULA) IMPLANT
CANNULA TWIST IN 8.25X9CM (CANNULA) IMPLANT
CONNECTOR PERFECT PASSER (CONNECTOR) ×1 IMPLANT
COOLER POLAR GLACIER W/PUMP (MISCELLANEOUS) ×1 IMPLANT
DEVICE SUCT BLK HOLE OR FLOOR (MISCELLANEOUS) ×2 IMPLANT
DRAPE 3/4 80X56 (DRAPES) ×1 IMPLANT
DRAPE U-SHAPE 47X51 STRL (DRAPES) ×1 IMPLANT
DURAPREP 26ML APPLICATOR (WOUND CARE) ×3 IMPLANT
ELECT REM PT RETURN 9FT ADLT (ELECTROSURGICAL) ×1
ELECTRODE REM PT RTRN 9FT ADLT (ELECTROSURGICAL) ×1 IMPLANT
GAUZE SPONGE 4X4 12PLY STRL (GAUZE/BANDAGES/DRESSINGS) ×1 IMPLANT
GAUZE XEROFORM 1X8 LF (GAUZE/BANDAGES/DRESSINGS) ×1 IMPLANT
GLOVE BIOGEL PI IND STRL 9 (GLOVE) ×1 IMPLANT
GLOVE BIOGEL PI ORTHO SZ9 (GLOVE) ×6 IMPLANT
GOWN STRL REUS TWL 2XL XL LVL4 (GOWN DISPOSABLE) ×1 IMPLANT
GOWN STRL REUS W/ TWL LRG LVL3 (GOWN DISPOSABLE) ×1 IMPLANT
GOWN STRL REUS W/TWL LRG LVL3 (GOWN DISPOSABLE) ×1
IV LACTATED RINGER IRRG 3000ML (IV SOLUTION) ×9
IV LR IRRIG 3000ML ARTHROMATIC (IV SOLUTION) ×8 IMPLANT
KIT STABILIZATION SHOULDER (MISCELLANEOUS) ×1 IMPLANT
KIT SUTURE 2.8 Q-FIX DISP (MISCELLANEOUS) ×1 IMPLANT
KIT SUTURETAK 3.0 INSERT PERC (KITS) IMPLANT
KIT TURNOVER KIT A (KITS) ×1 IMPLANT
MANIFOLD NEPTUNE II (INSTRUMENTS) ×1 IMPLANT
MASK FACE SPIDER DISP (MASK) ×1 IMPLANT
MAT ABSORB  FLUID 56X50 GRAY (MISCELLANEOUS) ×2
MAT ABSORB FLUID 56X50 GRAY (MISCELLANEOUS) ×2 IMPLANT
NDL SAFETY ECLIP 18X1.5 (MISCELLANEOUS) ×1 IMPLANT
NEEDLE HYPO 22GX1.5 SAFETY (NEEDLE) ×1 IMPLANT
PACK ARTHROSCOPY SHOULDER (MISCELLANEOUS) ×1 IMPLANT
PAD ABD DERMACEA PRESS 5X9 (GAUZE/BANDAGES/DRESSINGS) ×1 IMPLANT
PAD ARMBOARD 7.5X6 YLW CONV (MISCELLANEOUS) ×2 IMPLANT
PAD WRAPON POLAR SHDR XLG (MISCELLANEOUS) ×1 IMPLANT
PASSER SUT FIRSTPASS SELF (INSTRUMENTS) ×1 IMPLANT
SHAVER BLADE BONE CUTTER 4.5 (BLADE) ×1 IMPLANT
SHAVER BLADE TAPERED BLUNT 4 (BLADE) ×1 IMPLANT
SLEEVE REMOTE CONTROL 5X12 (DRAPES) ×1 IMPLANT
SPONGE T-LAP 18X18 ~~LOC~~+RFID (SPONGE) ×1 IMPLANT
STRIP CLOSURE SKIN 1/2X4 (GAUZE/BANDAGES/DRESSINGS) ×1 IMPLANT
SUT ETHILON 4 0 PS 2 18 (SUTURE) IMPLANT
SUT ETHILON 4-0 (SUTURE) ×1
SUT ETHILON 4-0 FS2 18XMFL BLK (SUTURE) ×1
SUT LASSO 90 DEG SD STR (SUTURE) IMPLANT
SUT MNCRL 4-0 (SUTURE) ×1
SUT MNCRL 4-0 27XMFL (SUTURE) ×1
SUT PDS AB 0 CT1 27 (SUTURE) ×3 IMPLANT
SUT PERFECTPASSER WHITE CART (SUTURE) ×4 IMPLANT
SUT SMART STITCH CARTRIDGE (SUTURE) ×4 IMPLANT
SUT ULTRABRAID 2 COBRAID 38 (SUTURE) IMPLANT
SUT VIC AB 0 CT1 36 (SUTURE) ×3 IMPLANT
SUT VIC AB 2-0 CT2 27 (SUTURE) ×1 IMPLANT
SUTURE ETHLN 4-0 FS2 18XMF BLK (SUTURE) ×1 IMPLANT
SUTURE MNCRL 4-0 27XMF (SUTURE) ×1 IMPLANT
SYR 10ML LL (SYRINGE) ×1 IMPLANT
TAPE MICROFOAM 4IN (TAPE) ×1 IMPLANT
TRAP FLUID SMOKE EVACUATOR (MISCELLANEOUS) ×1 IMPLANT
TUBING CONNECTING 10 (TUBING) ×1 IMPLANT
TUBING INFLOW SET DBFLO PUMP (TUBING) ×1 IMPLANT
TUBING OUTFLOW SET DBLFO PUMP (TUBING) ×1 IMPLANT
WAND WEREWOLF FLOW 90D (MISCELLANEOUS) ×1 IMPLANT
WATER STERILE IRR 500ML POUR (IV SOLUTION) ×1 IMPLANT
WRAPON POLAR PAD SHDR XLG (MISCELLANEOUS) ×1

## 2022-10-16 NOTE — Discharge Instructions (Signed)

## 2022-10-16 NOTE — Anesthesia Procedure Notes (Signed)
Anesthesia Regional Block: Interscalene brachial plexus block   Pre-Anesthetic Checklist: , timeout performed,  Correct Patient, Correct Site, Correct Laterality,  Correct Procedure, Correct Position, site marked,  Risks and benefits discussed,  Surgical consent,  Pre-op evaluation,  At surgeon's request and post-op pain management  Laterality: Upper and Right  Prep: chloraprep       Needles:  Injection technique: Single-shot  Needle Type: Stimiplex     Needle Length: 9cm  Needle Gauge: 22     Additional Needles:   Procedures:,,,, ultrasound used (permanent image in chart),,    Narrative:  Start time: 10/16/2022 9:25 AM End time: 10/16/2022 9:28 AM Injection made incrementally with aspirations every 5 mL.  Performed by: Personally  Anesthesiologist: Iran Ouch, MD  Additional Notes: Patient consented for risk and benefits of nerve block including but not limited to nerve damage, Horner's syndrome, failed block, bleeding and infection.  Patient voiced understanding.  Functioning IV was confirmed and monitors were applied.  Timeout done prior to procedure and prior to any sedation being given to the patient.  Patient confirmed procedure site prior to any sedation given to the patient.  A 36m 22ga Stimuplex needle was used. Sterile prep,hand hygiene and sterile gloves were used.  Minimal sedation used for procedure.  No paresthesia endorsed by patient during the procedure.  Negative aspiration and negative test dose prior to incremental administration of local anesthetic. The patient tolerated the procedure well with no immediate complications.

## 2022-10-16 NOTE — Op Note (Addendum)
10/16/2022  6:17 PM  PATIENT:  Andrea Mcdonald  61 y.o. female  PRE-OPERATIVE DIAGNOSIS:  Right shoulder high grade partial rotator cuff tear in setting of previous rotator cuff tear   POST-OPERATIVE DIAGNOSIS: Right shoulder full-thickness rotator cuff tear of the supraspinatus with high-grade partial-thickness tear of the subscapularis, high-grade partial-thickness tear of the long head of the biceps tendon with extreme fraying, acromioclavicular joint arthrosis and subacromial impingement  PROCEDURE: Right shoulder arthroscopic biceps tenotomy, subscapularis repair, subacromial decompression and distal clavicle excision with mini open rotator cuff repair.  SURGEON:  Surgeon(s) and Role:    * Thornton Park, MD - Primary  ANESTHESIA:   General with interscalene block with Exparel   PREOPERATIVE INDICATIONS:  Andrea Mcdonald is a  61 y.o. female with a diagnosis of Right Shoulder Rotator Cuff Tear who failed conservative treatment and elected for surgical fixation.    The risks benefits and alternatives were discussed with the patient preoperatively including but not limited to the risks of infection, bleeding, nerve injury, persistent pain or weakness, shoulder stiffness/arthrofibrosis, failure of the repair, re-tear of the rotator cuff and the need for further surgery. Medical risks include DVT and pulmonary embolism, myocardial infarction, stroke, pneumonia, respiratory failure and death. Patient understood these risks and wished to proceed.  OPERATIVE IMPLANTS: [place] MultiFix anchors x 2 & [employer] Nephew Q Fix anchors x 1  OPERATIVE PROCEDURE: The patient was met in the preoperative area. The right shoulder was signed with the word yes and my initials according the hospital's correct site of surgery protocol.   A pre-op history and physical was performed at the bedside.  Patient underwent an interscalene block with Exparel by the anesthesia service.  Patient was brought  to the operating room where she underwent anesthesia.  The patient was placed in a beachchair position.  A spider arm positioner was used for this case.  Examination under anesthesia revealed no loss of passive range of motion or instability with load shift testing. The patient had a negative sulcus sign.  Patient was prepped and draped in a sterile fashion. A timeout was performed to verify the patient's name, date of birth, medical record number, correct site of surgery and correct procedure to be performed there was also used to verify the patient received antibiotics that all appropriate instruments, implants and radiographs studies were available in the room. Once all in attendance were in agreement case began.  Patient received ancef 2 grams IV for pre-op antibiotics.  Bony landmarks were drawn out with a surgical marker along with proposed arthroscopy incisions.  An 11 blade was used to establish a posterior portal through which the arthroscope was placed in the glenohumeral joint. A full diagnostic examination of the shoulder was performed.  Patient was found to have a full-thickness rotator cuff tear of the supraspinatus with high-grade partial-thickness tear of the subscapularis.  The head of the biceps tendon was medialized with a high-grade partial-thickness tear and extreme fraying.  The decision was made to perform a tenotomy as with the extreme fraying and tenodesis could not be performed.  The tenotomy was performed using a 90 degree Smith & Nephew werewolf wand.  The rotator interval was debrided of scar tissue and adhesions using a 90 degree Smith & Nephew werewolf wand.  Wand was also used to perform an anterior capsulotomy adhesive capsulitis.  The subscapularis was freed of all adhesions.  Patient had a high-grade partial-thickness tear of the subscapularis without retraction.  The  lesser tuberosity was debrided using a 4.5 mm full-radius shaver until punctate bleeding was identified.  A  perfect suture was placed into the lateral border of the subscapularis tendon which was then fixed to the lesser tuberosity using a single [employer] MultiFix anchor.  An arthroscopic image was taken of this repair.  The arthroscope was then placed in the subacromial space. Extensive bursitis was encountered and debrided using a tapered shaver blade and a 90 ArthroCare wand from a lateral portal which was established under direct visualization using an 18-gauge spinal needle. A subacromial decompression was performed using a 4.5 mm bone cutter shaver blade from the lateral portal.  A distal clavicle excision was then performed through the anterior portal also using the 4.5 mm resector shaver blade.  A 4.5 mm resector shaver blade was then used to debride the greater tuberosity of all torn fibers of the rotator cuff.  Two Perfect Pass sutures were placed in the lateral border of the rotator cuff tear. All arthroscopic instruments were then removed and the mini-open portion of the procedure began.  A saber-type incision was made along the lateral border of the acromion. The deltoid muscle was identified and split in line with its fibers which allowed visualization of the rotator cuff. The Perfect Pass sutures previously placed in the lateral border of the rotator cuff were brought out through the deltoid split.  A single Smith and [employer] Fix anchors were placed at the articular margin of the humeral head with the greater tuberosity. The suture limbs of the Q Fix anchors were passed medially through the rotator cuff using a First Pass suture passer. The Two Perfect Pass sutures were then anchored to the greater tuberosity footprint using a single [employer] Multifix anchor. The sutures passed through the Multifix anchors were then tensioned to allow reduction of the rotator cuff to the greater tuberosity footprint. The medial row repair was then performed using the Q fix sutures, which were tied down  using an arthroscopic knot tying technique.  Arthroscopic images of the repair were taken with the arthroscope both externally and from inside the glenohumeral joint.  All incisions were copiously irrigated. The deltoid fascia was repaired using a 0 Vicryl suture.  The subcutaneous tissue of all incisions were closed with a 2-0 Vicryl. Skin closure for the arthroscopic incisions was performed with 4-0 nylon. The skin edges of the saber incision was approximated with a running 4-0 undyed Monocryl.  A dry sterile dressing was applied.  The patient was placed in an abduction sling and a Polar Care was applied to the shoulder.  All sharp, sponge and it instrument counts were correct at the conclusion of the case. I was scrubbed and present for the entire case. I spoke with the patient's husband postoperatively to let him know the case had been performed without complication and the patient was stable in recovery room.

## 2022-10-16 NOTE — Anesthesia Preprocedure Evaluation (Addendum)
Anesthesia Evaluation  Patient identified by MRN, date of birth, ID band Patient awake    Reviewed: Allergy & Precautions, NPO status , Patient's Chart, lab work & pertinent test results  Airway Mallampati: III  TM Distance: >3 FB Neck ROM: full    Dental  (+) Teeth Intact   Pulmonary neg pulmonary ROS   Pulmonary exam normal breath sounds clear to auscultation       Cardiovascular Exercise Tolerance: Good Normal cardiovascular exam Rhythm:Regular     Neuro/Psych  PSYCHIATRIC DISORDERS Anxiety Depression    negative neurological ROS     GI/Hepatic negative GI ROS, Neg liver ROS,,,  Endo/Other  diabetes, Well Controlled, Type 2    Renal/GU negative Renal ROS  negative genitourinary   Musculoskeletal  (+) Arthritis ,    Abdominal Normal abdominal exam  (+)   Peds negative pediatric ROS (+)  Hematology negative hematology ROS (+)   Anesthesia Other Findings Past Medical History: No date: Anxiety No date: Arthritis No date: Chronic venous insufficiency No date: Depression No date: High cholesterol No date: History of kidney stones No date: Hypertension 05/25/2013: Plantar fasciitis, bilateral No date: RLS (restless legs syndrome) No date: Type 2 diabetes mellitus (HCC) No date: Vitamin D deficiency  Past Surgical History: No date: AUGMENTATION MAMMAPLASTY 09/14/2020: COLONOSCOPY WITH PROPOFOL; N/A     Comment:  Procedure: COLONOSCOPY WITH PROPOFOL;  Surgeon: Jonathon Bellows, MD;  Location: [facility] ENDOSCOPY;  Service:               Gastroenterology;  Laterality: N/A; No date: CYSTOSCOPY 2008: ESOPHAGOGASTRIC FUNDOPLICATION     Comment:  at [facility], for GERD No date: KIDNEY STONE SURGERY No date: LITHOTRIPSY No date: NECK SURGERY     Comment:  x2 No date: PARTIAL HYSTERECTOMY No date: PLACEMENT OF BREAST IMPLANTS  BMI    Body Mass Index: 28.25 kg/m      Reproductive/Obstetrics negative OB  ROS                             Anesthesia Physical Anesthesia Plan  ASA: 2  Anesthesia Plan: General   Post-op Pain Management: Regional block*   Induction: Intravenous  PONV Risk Score and Plan: 1 and Ondansetron, Dexamethasone and Midazolam  Airway Management Planned: Oral ETT  Additional Equipment:   Intra-op Plan:   Post-operative Plan: Extubation in OR  Informed Consent: I have reviewed the patients History and Physical, chart, labs and discussed the procedure including the risks, benefits and alternatives for the proposed anesthesia with the patient or authorized representative who has indicated his/her understanding and acceptance.     Dental Advisory Given  Plan Discussed with: CRNA and Surgeon  Anesthesia Plan Comments:         Anesthesia Quick Evaluation

## 2022-10-16 NOTE — Anesthesia Procedure Notes (Signed)
Procedure Name: Intubation Date/Time: 10/16/2022 12:17 PM  Performed by: Jacqualin Combes, CRNAPre-anesthesia Checklist: Patient identified, Emergency Drugs available, Suction available and Patient being monitored Patient Re-evaluated:Patient Re-evaluated prior to induction Oxygen Delivery Method: Circle system utilized Preoxygenation: Pre-oxygenation with 100% oxygen Induction Type: IV induction Ventilation: Mask ventilation without difficulty Laryngoscope Size: Mac and 4 Grade View: Grade II Tube type: Oral Tube size: 7.0 mm Number of attempts: 1 Airway Equipment and Method: Stylet and Oral airway Placement Confirmation: ETT inserted through vocal cords under direct vision, positive ETCO2 and breath sounds checked- equal and bilateral Secured at: 22 (Taped left corner of mouth. CA) cm Tube secured with: Tape Dental Injury: Teeth and Oropharynx as per pre-operative assessment  Comments: Cords clear. CA

## 2022-10-16 NOTE — H&P (Signed)
PREOPERATIVE H&P  Chief Complaint: Right Shoulder Rotator Cuff Tear status post previous right shoulder rotator cuff tear  HPI: Andrea Mcdonald is a 61 y.o. female who presents for preoperative history and physical with a diagnosis of Right Shoulder Rotator Cuff Tear confirmed by MRI. Symptoms of right shoulder pain limited range of motion are significantly impairing activities of daily living.  Patient has failed nonoperative management wished to proceed with a right shoulder arthroscopic evaluation of the right shoulder with mini open rotator cuff repair and possible biceps tenodesis versus tenotomy.   Past Medical History:  Diagnosis Date   Anxiety    Arthritis    Chronic venous insufficiency    Depression    High cholesterol    History of kidney stones    Hypertension    Plantar fasciitis, bilateral 05/25/2013   RLS (restless legs syndrome)    Type 2 diabetes mellitus (HCC)    Vitamin D deficiency    Past Surgical History:  Procedure Laterality Date   AUGMENTATION MAMMAPLASTY     COLONOSCOPY WITH PROPOFOL N/A 09/14/2020   Procedure: COLONOSCOPY WITH PROPOFOL;  Surgeon: Jonathon Bellows, MD;  Location: [facility] ENDOSCOPY;  Service: Gastroenterology;  Laterality: N/A;   CYSTOSCOPY     CYSTOSCOPY W/ RETROGRADES Bilateral 11/26/2021   Procedure: CYSTOSCOPY WITH RETROGRADE PYELOGRAM;  Surgeon: Abbie Sons, MD;  Location: [facility] ORS;  Service: Urology;  Laterality: Bilateral;   ESOPHAGOGASTRIC FUNDOPLICATION  [id number]   at [facility], for GERD   [place]     x2   PARTIAL HYSTERECTOMY     PLACEMENT OF BREAST IMPLANTS     right hand trigger release  03/2021   Social History   Socioeconomic History   Marital status: Married    Spouse name: Not on file   Number of children: Not on file   Years of education: Not on file   Highest education level: Not on file  Occupational History   Not on file  Tobacco Use   Smoking status: Never   Smokeless  tobacco: Never  Vaping Use   Vaping Use: Never used  Substance and Sexual Activity   Alcohol use: Yes    Comment: drink occ   Drug use: No   Sexual activity: Yes  Other Topics Concern   Not on file  Social History Narrative   Not on file   Social Determinants of Health   Financial Resource Strain: Not on file  Food Insecurity: Not on file  Transportation Needs: Not on file  Physical Activity: Not on file  Stress: Not on file  Social Connections: Not on file   Family History  Problem Relation Age of Onset   Heart disease Mother    Depression Mother    Anxiety disorder Mother    Cancer Mother        retinal   Breast cancer Neg Hx    Allergies  Allergen Reactions   Codeine Itching   Prior to Admission medications   Medication Sig Start Date End Date Taking? Authorizing Provider  fluticasone (FLONASE) 50 MCG/ACT nasal spray Use 2 spray(s) in each nostril once daily Patient taking differently: Place 2 sprays into both nostrils daily as needed for allergies. 02/21/19  Yes Karamalegos, Devonne Doughty, DO  gabapentin (NEURONTIN) 300 MG capsule Take 1 capsule by mouth q 8 hours a 04/17/22  Yes [provider]  lisinopril (ZESTRIL) 5 MG tablet Take 1 tablet by mouth  once daily 09/08/22  Yes Karamalegos, Devonne Doughty, DO  methocarbamol (ROBAXIN) 500 MG tablet Take 1 tablet by mouth 2 (two) times daily.   Yes [provider]  ondansetron (ZOFRAN) 4 MG tablet Take 1 tablet (4 mg total) by mouth every 8 (eight) hours as needed for nausea or vomiting. 10/16/22  Yes Thornton Park, MD  oxyCODONE (OXY IR/ROXICODONE) 5 MG immediate release tablet Take 1 tablet (5 mg total) by mouth every 4 (four) hours as needed for severe pain. 10/16/22  Yes Thornton Park, MD  rosuvastatin (CRESTOR) 20 MG tablet TAKE 1 TABLET BY MOUTH AT BEDTIME 09/08/22  Yes Karamalegos, Devonne Doughty, DO  sertraline (ZOLOFT) 100 MG tablet TAKE 1 & 1/2 (ONE & ONE-HALF) TABLETS BY MOUTH AT BEDTIME 09/08/22   Yes Karamalegos, Devonne Doughty, DO  Dulaglutide (TRULICITY) 3 LK/5.6YB SOPN Inject 3 mg as directed once a week. 08/26/22   Mecum, Erin E, PA-C  EQ LORATADINE 10 MG tablet Take 1 tablet by mouth once daily 04/04/20   Parks Ranger, Devonne Doughty, DO  meloxicam (MOBIC) 15 MG tablet Take 1 tablet by mouth daily. 07/31/21   [provider]  metFORMIN (GLUCOPHAGE-XR) 500 MG 24 hr tablet Take 1 tablet (500 mg total) by mouth 2 (two) times daily with a meal. 09/24/22   Karamalegos, Devonne Doughty, DO  NEOMYCIN-POLYMYXIN-HYDROCORTISONE (CORTISPORIN) 1 % SOLN OTIC solution Apply 1-2 drops to toe BID after soaking 09/15/22   Hyatt, Max T, DPM     Positive ROS: All other systems have been reviewed and were otherwise negative with the exception of those mentioned in the HPI and as above.  Physical Exam: General: Alert, no acute distress Cardiovascular: Regular rate and rhythm, no murmurs rubs or gallops.  No pedal edema Respiratory: Clear to auscultation bilaterally, no wheezes rales or rhonchi. No cyanosis, no use of accessory musculature GI: No organomegaly, abdomen is soft and non-tender nondistended with positive bowel sounds. Skin: Skin intact, no lesions within the operative field. Neurologic: Sensation intact distally Psychiatric: Patient is competent for consent with normal mood and affect Lymphatic: No cervical lymphadenopathy  MUSCULOSKELETAL: Right shoulder:  Right shoulder: The patient has pain in the right shoulder with active forward elevation and abduction beyond 90 degrees. She had positive impingement signs, but no apprehension or instability. She has pain with a downward directed force on her abducted shoulder and demonstrates mild weakness to shoulder abduction, but no significant weakness to shoulder internal or external rotation. She has full digital wrist and elbow range of motion, intact sensation to light touch and a palpable radial pulse.  Radiology:  I reviewed the MRI images as  well as the radiology report for the patient's right shoulder MRI. The patient had a near full thickness tear of the supraspinatus and subscapularis insertions. She has moderate bursitis. She has a medially dislocated long head of the biceps tendon and subtle adhesive capsulitis.  Assessment: Right Shoulder Rotator Cuff Tear  Plan: Plan for Procedure(s): Right shoulder arthroscopy with subacromial decompression, distal clavicle excision, mini-open revision rotator cuff repair and possible biceps tenotomy versus tenodesis  I reviewed the details of the operation as well as the postoperative course with the patient.  I marked the right shoulder according hospitals correct site of surgery protocol.  A preop history and physical was performed at the bedside this morning.  I answered all the patient's questions.  I discussed the risks and benefits of surgery. The risks include but are not limited to infection, bleeding, nerve or blood vessel  injury, joint stiffness or loss of motion, persistent pain, weakness or instability, retear of the rotator cuff, failure of the repair, hardware failure and the need for further surgery. Patient understood these risks and wished to proceed.     Thornton Park, MD   10/16/2022 3:08 PM

## 2022-10-16 NOTE — Progress Notes (Signed)
  Perioperative Services Pre-Admission/Anesthesia Testing    Date: 10/16/22  Name: Andrea Mcdonald MRN:   [id number]  Re: GLP-1 clearance and provider recommendations   Planned Surgical Procedure(s):    Case: [id number] Date/Time: 10/16/22 0935   Procedures:      SHOULDER ARTHROSCOPY WITH OPEN ROTATOR CUFF REPAIR AND DISTAL CLAVICLE ACROMIONECTOMY (Right)     BICEPS TENODESIS (Right)   Anesthesia type: General   Pre-op diagnosis: Right Shoulder Rotator Cuff Tear   Location: [place] OR ROOM 02 / [place] [facility]S FOR [facility]EST[facility]IA [facility]   Surgeons: Thornton Park, MD   Clinical Notes:  Patient is scheduled for the above procedure with the indicated provider/surgeon. In review of her medication reconciliation it was noted that patient is on a prescribed GLP-1 medication. Per guidelines issued by the American Society of Anesthesiologists (ASA), it is recommended that these medications be held for 7 days prior to the patient undergoing any type of elective surgical procedure. The patient is taking the following GLP-1 medication:  '[]'$  SEMAGLUTIDE   '[]'$  EXENATIDE  '[]'$  LIRAGLUTIDE   '[]'$  LIXISENATIDE  '[x]'$  DULAGLUTIDE     '[]'$  OTHER GLP-1 medication: _______________  Reached out to prescribing provider Parks Ranger, MD) to make them aware of the guidelines from anesthesia. Given that this patient takes the prescribed GLP-1 medication for her  diabetes diagnosis, rather than for weight loss, recommendations from the prescribing provider were solicited. Prescribing provider made aware of the following so that informed decision/POC can be developed for this patient that may be taking medications belonging to these drug classes:  Oral GLP-1 medications will be held 1 day prior to surgery.  Injectable GLP-1 medications will be held 7 days prior to surgery.  Metformin is routinely held 48 hours prior to surgery due to renal concerns, potential need for contrasted imaging perioperatively, and the potential for  tissue hypoxia leading to drug induced lactic acidosis.  All SGLT2i medications are held 72 hours prior to surgery as they can be associated with the increased potential for developing euglycemic diabetic ketoacidosis (EDKA).   Impression and Plan:  Andrea Mcdonald is on a prescribed GLP-1 medication, which induces the known side effect of decreased gastric emptying. Efforts are bring made to mitigate the risk of perioperative hyperglycemic events, as elevated blood glucose levels have been found to contribute to intra/postoperative complications. Additionally, hyperglycemic extremes can potentially necessitate the postponing of a patient's elective case in order to better optimize perioperative glycemic control, again with the aforementioned guidelines in place. With this in mind, recommendations have been sought from the prescribing provider, who has cleared patient to proceed with holding the prescribed GLP-1 as per the guidelines from the ASA.   Provider recommending: no further recommendations received from the prescribing provider.  Copy of signed clearance and recommendations placed on patient's chart for inclusion in their medical record and for review by the surgical/anesthetic team on the day of her procedure.   Honor Loh, MSN, APRN, FNP-C, CEN [facility]  Peri-operative Services Nurse Practitioner Phone: [phone] 10/16/22 10:34 AM  NOTE: This note has been prepared using Dragon dictation software. Despite my best ability to proofread, there is always the potential that unintentional transcriptional errors may still occur from this process.

## 2022-10-16 NOTE — Transfer of Care (Signed)
Immediate Anesthesia Transfer of Care Note  Patient: JAYCI ELLEFSON  Procedure(s) Performed: SHOULDER ARTHROSCOPY WITH OPEN ROTATOR CUFF REPAIR AND DISTAL CLAVICLE EXCISION, SUBACROMIAL DECOMPRESSION (Right: Shoulder) BICEPS TENOTOMY (Right: Shoulder)  Patient Location: PACU  Anesthesia Type:General  Level of Consciousness: drowsy and patient cooperative  Airway & Oxygen Therapy: Patient Spontanous Breathing and Patient connected to face mask oxygen  Post-op Assessment: Report given to RN and Post -op Vital signs reviewed and stable  Post vital signs: Reviewed and stable  Last Vitals:  Vitals Value Taken Time  BP 126/68 10/16/22 1456  Temp    Pulse 74 10/16/22 1457  Resp 16 10/16/22 1457  SpO2 100 % 10/16/22 1457  Vitals shown include unvalidated device data.  Last Pain:  Vitals:   10/16/22 0837  PainSc: 5          Complications: No notable events documented.

## 2022-10-17 ENCOUNTER — Encounter: Payer: Self-pay | Admitting: Orthopedic Surgery

## 2022-10-17 NOTE — Anesthesia Postprocedure Evaluation (Signed)
Anesthesia Post Note  Patient: Andrea Mcdonald  Procedure(s) Performed: SHOULDER ARTHROSCOPY WITH OPEN ROTATOR CUFF REPAIR AND DISTAL CLAVICLE EXCISION, SUBACROMIAL DECOMPRESSION (Right: Shoulder) BICEPS TENOTOMY (Right: Shoulder)  Patient location during evaluation: PACU Anesthesia Type: General Level of consciousness: awake and alert Pain management: pain level controlled Vital Signs Assessment: post-procedure vital signs reviewed and stable Respiratory status: spontaneous breathing, nonlabored ventilation and respiratory function stable Cardiovascular status: blood pressure returned to baseline and stable Postop Assessment: no apparent nausea or vomiting Anesthetic complications: no   No notable events documented.   Last Vitals:  Vitals:   10/16/22 1545 10/16/22 1602  BP: 119/62 134/80  Pulse: 72 71  Resp: 13 (!) 22  Temp:  36.5 C  SpO2: 95% 97%    Last Pain:  Vitals:   10/16/22 1602  TempSrc: Temporal  PainSc: 0-No pain                 [place] Ouch

## 2022-10-22 ENCOUNTER — Other Ambulatory Visit: Payer: Self-pay

## 2022-10-22 ENCOUNTER — Telehealth: Payer: Self-pay

## 2022-10-22 ENCOUNTER — Ambulatory Visit
Admission: RE | Admit: 2022-10-22 | Discharge: 2022-10-22 | Disposition: A | Discharge status: Home / Self Care | Payer: Self-pay | Source: Ambulatory Visit | Attending: Neurosurgery | Admitting: Neurosurgery

## 2022-10-22 DIAGNOSIS — Z049 Encounter for examination and observation for unspecified reason: Secondary | ICD-10-CM

## 2022-10-22 NOTE — Telephone Encounter (Signed)
Record release form was filled out by patient and faxed to [facility], notes are pending.

## 2022-10-22 NOTE — Telephone Encounter (Signed)
Pt dropped off MRI CD from [facility]. CD has been loaded into Epic with MRI lumbar spine from 07/02/22. We do not have the report.

## 2022-10-29 DIAGNOSIS — M25611 Stiffness of right shoulder, not elsewhere classified: Secondary | ICD-10-CM | POA: Diagnosis not present

## 2022-10-29 DIAGNOSIS — M25511 Pain in right shoulder: Secondary | ICD-10-CM | POA: Diagnosis not present

## 2022-11-03 NOTE — Telephone Encounter (Signed)
Refaxed release to [facility]

## 2022-11-05 DIAGNOSIS — M25611 Stiffness of right shoulder, not elsewhere classified: Secondary | ICD-10-CM | POA: Diagnosis not present

## 2022-11-05 DIAGNOSIS — M25511 Pain in right shoulder: Secondary | ICD-10-CM | POA: Diagnosis not present

## 2022-11-07 DIAGNOSIS — M25611 Stiffness of right shoulder, not elsewhere classified: Secondary | ICD-10-CM | POA: Diagnosis not present

## 2022-11-07 DIAGNOSIS — M25511 Pain in right shoulder: Secondary | ICD-10-CM | POA: Diagnosis not present

## 2022-11-10 NOTE — Telephone Encounter (Signed)
Released faxed to [facility] again

## 2022-11-11 DIAGNOSIS — M25511 Pain in right shoulder: Secondary | ICD-10-CM | POA: Diagnosis not present

## 2022-11-11 DIAGNOSIS — M25611 Stiffness of right shoulder, not elsewhere classified: Secondary | ICD-10-CM | POA: Diagnosis not present

## 2022-11-11 NOTE — Telephone Encounter (Signed)
Her [facility] notes are on your desk. There are additional notes but are all about her shoulder surgery and carpal tunnel. If you need those I can print them to. I just printed out what I saw was lumbar related.

## 2022-11-11 NOTE — Telephone Encounter (Signed)
Patient confirmed appt for 11/25/2022.

## 2022-11-11 NOTE — Telephone Encounter (Addendum)
2nd opinion for lumbar 2 PT visits (09/23/22 and 09/29/22). Pt has had injections.  Dr Izora Ribas has agreed to see her.

## 2022-11-12 IMAGING — CT CT ABD-PEL WO/W CM
3 of 12 series · 12 of 46 positions shown, 18 images · IV contrast (omnipaque)
Comparison: CT abdomen and pelvis dated October 29, 2014

CLINICAL DATA: Hematuria and left flank pain

EXAM:
CT ABDOMEN AND PELVIS WITHOUT AND WITH CONTRAST
TECHNIQUE: Multidetector CT imaging of the abdomen and pelvis was performed
following the standard protocol before and following the bolus
administration of intravenous contrast.
CONTRAST:  100mL OMNIPAQUE IOHEXOL 300 MG/ML  SOLN

[Series 3: delay prone · axial · delayed · 0.76mm/px · z∈[+937,+1267]mm · 7 of 90 slices shown, 12 images]
[im 12/90  soft-tissue]
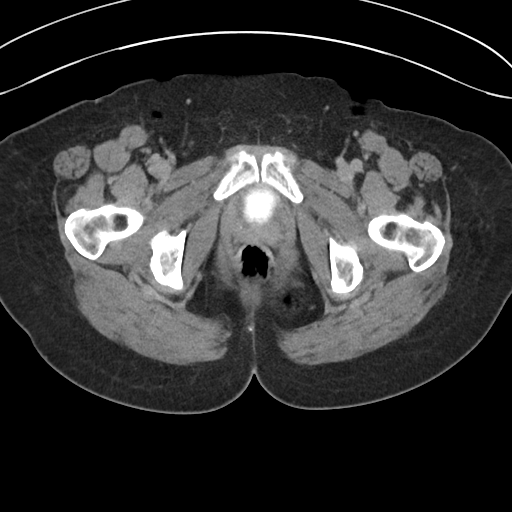
[im 12/90  bone]
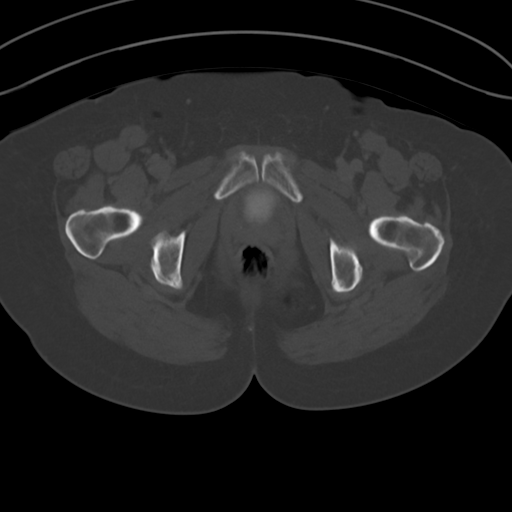
[im 23/90  soft-tissue]
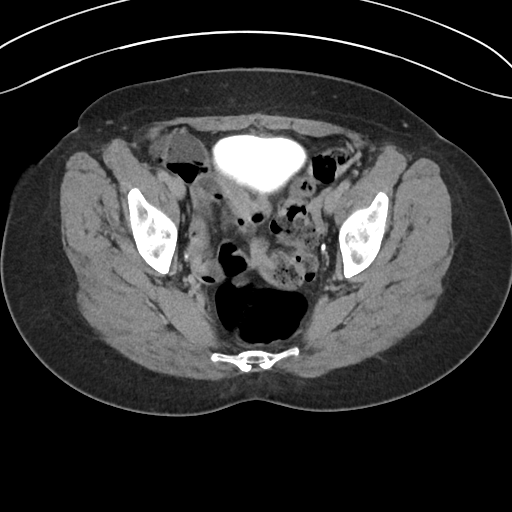
[im 34/90  soft-tissue]
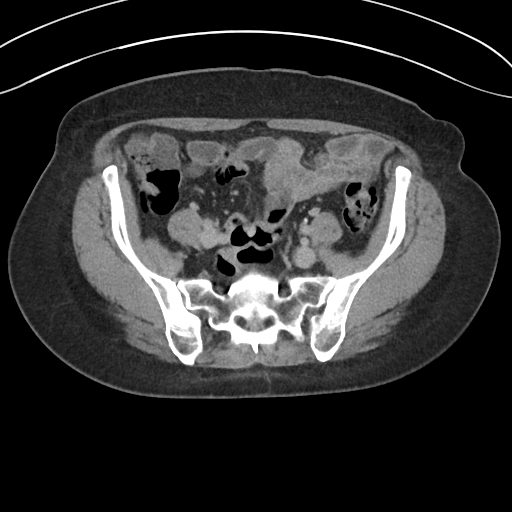
[im 45/90  soft-tissue]
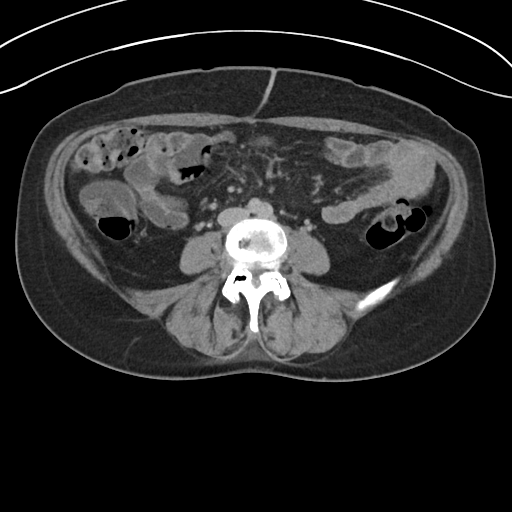
[im 45/90  lung]
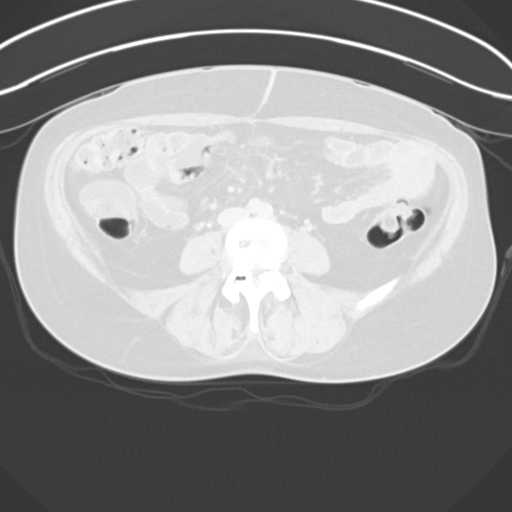
[im 56/90  soft-tissue]
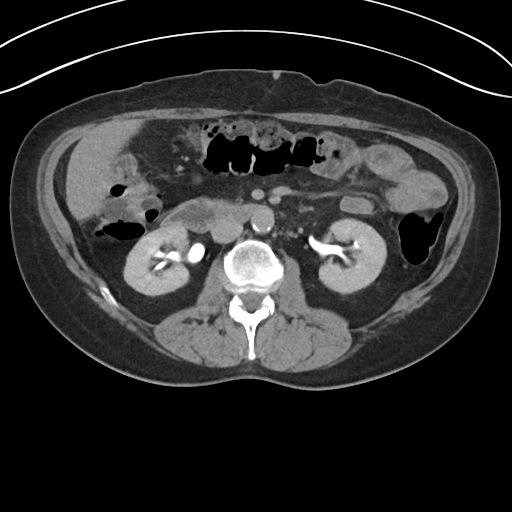
[im 56/90  lung]
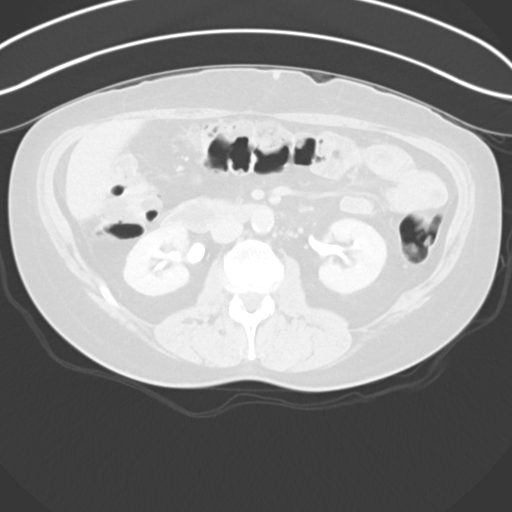
[im 67/90  soft-tissue]
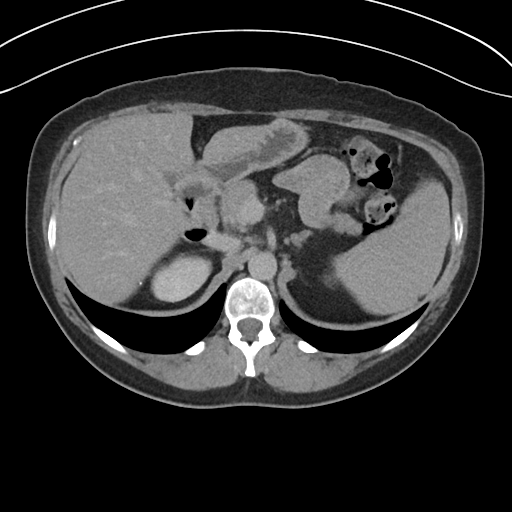
[im 67/90  lung]
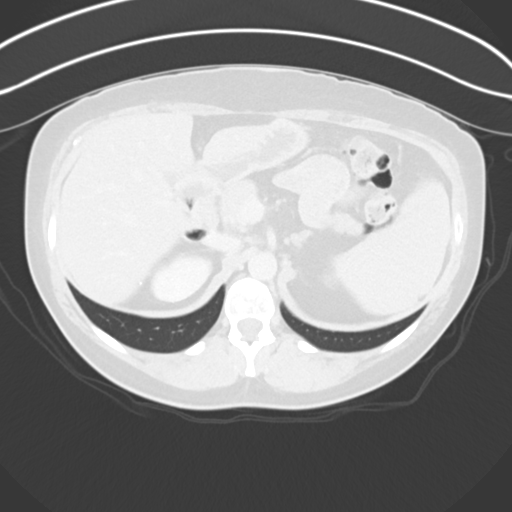
[im 78/90  soft-tissue]
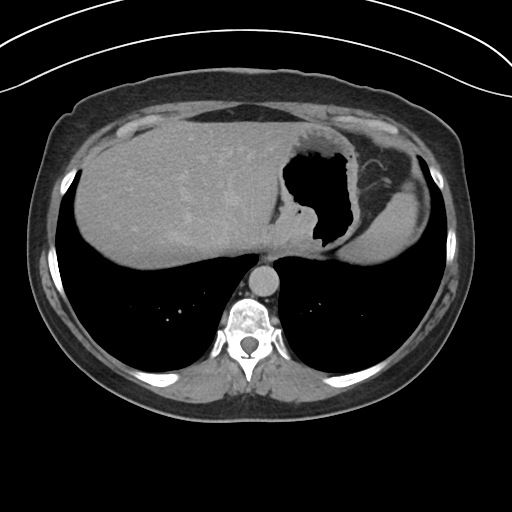
[im 78/90  lung]
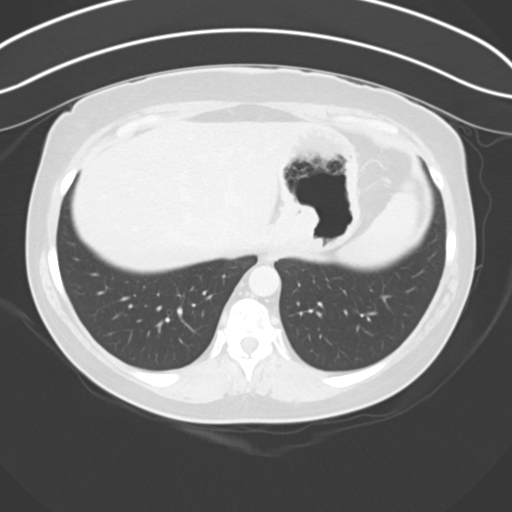

[Series 6: coronal delay · coronal · delayed · 0.78mm/px · 2 of 97 slices shown, 3 images]
[im 33/97  soft-tissue]
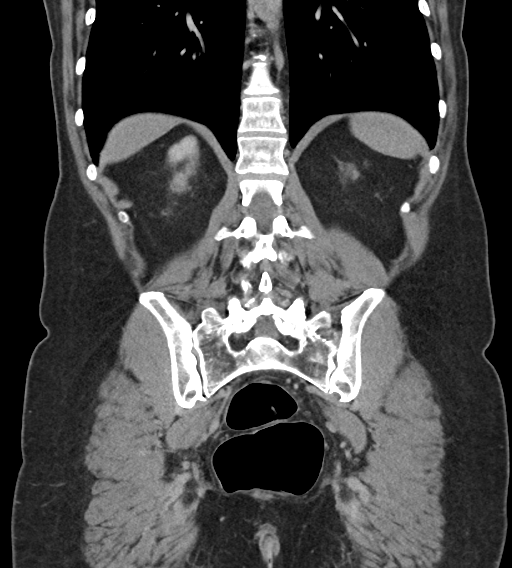
[im 33/97  bone]
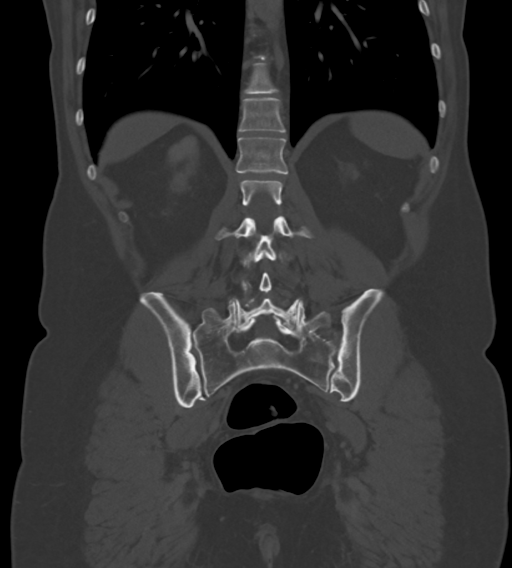
[im 65/97  soft-tissue]
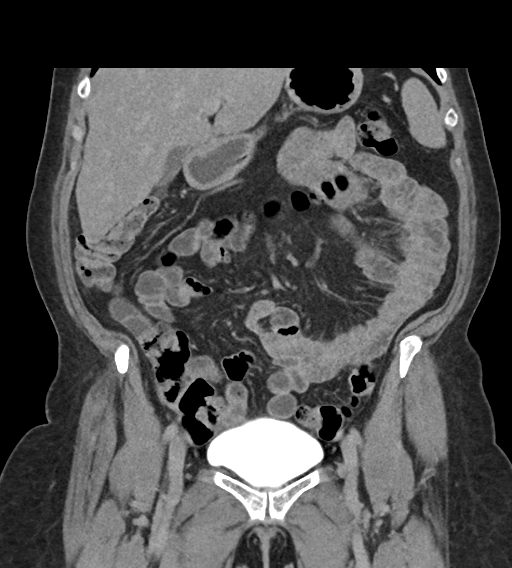

[Series 8: pre supine · axial · non-contrast · 0.79mm/px · z∈[+953,+1078]mm · 3 of 88 slices shown]
[im 13/88  soft-tissue]
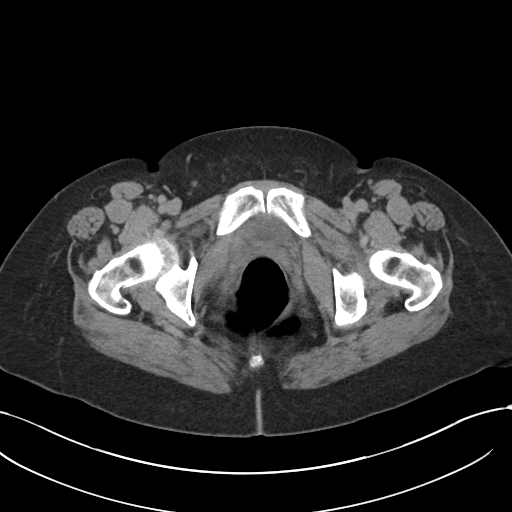
[im 25/88  soft-tissue]
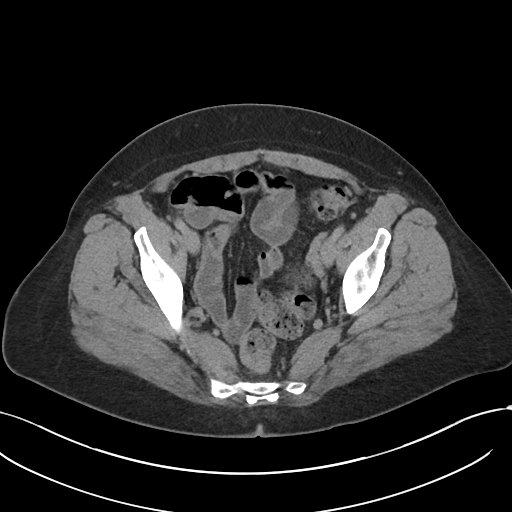
[im 38/88  soft-tissue]
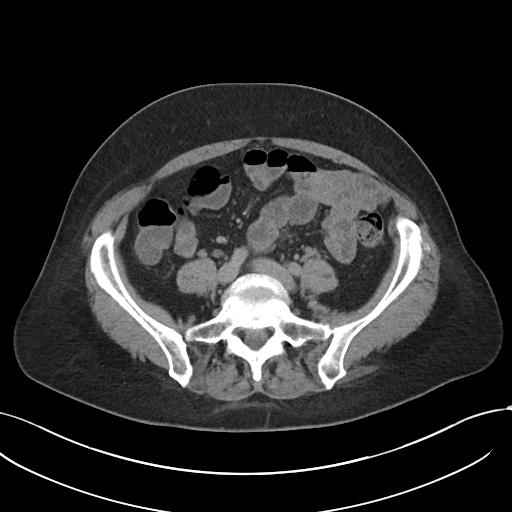

[12 of 46 positions shown; findings below may reference images not displayed]

FINDINGS: Lower chest: No acute abnormality.

Hepatobiliary: No focal liver abnormality is seen. No gallstones,
gallbladder wall thickening, or biliary dilatation.

Pancreas: Unremarkable. No pancreatic ductal dilatation or
surrounding inflammatory changes.

Spleen: Normal in size without focal abnormality.

Adrenals/Urinary Tract: Bilateral adrenal glands are unremarkable.
Kidneys enhance symmetrically with no evidence hydronephrosis.
Punctate nonobstructing stone of the lower pole the right kidney. No
suspicious filling defects of the opacified ureters, although the
mid portions of the bilateral ureters are incompletely opacified.

Stomach/Bowel: Small hiatal hernia. Appendix is not visualized. No
evidence of bowel wall thickening, distention, or inflammatory
changes.

Vascular/Lymphatic: Aortic atherosclerosis. No enlarged abdominal or
pelvic lymph nodes.

Reproductive: Status post hysterectomy. No adnexal masses.

Other: No abdominal wall hernia or abnormality. No abdominopelvic
ascites.

Musculoskeletal: Mild grade 1 anterolisthesis L4 and L5. No
aggressive appearing osseous lesions.
IMPRESSION: 1. No evidence obstructive uropathy. No suspicious filling defects
of the opacified renal collecting systems.
2. Punctate nonobstructing stone of the lower pole the right kidney.
3.  Aortic Atherosclerosis ([id number]).

## 2022-11-14 DIAGNOSIS — M25611 Stiffness of right shoulder, not elsewhere classified: Secondary | ICD-10-CM | POA: Diagnosis not present

## 2022-11-14 DIAGNOSIS — M25511 Pain in right shoulder: Secondary | ICD-10-CM | POA: Diagnosis not present

## 2022-11-24 NOTE — H&P (View-Only) (Signed)
Referring Physician:  No referring provider defined for this encounter.  Primary Physician:  Olin Hauser, DO  History of Present Illness: 11/25/2022 Ms. Kinly Daube is here today with a chief complaint of back and right leg pain.  She has been having pain for more than 2 years worsening over time.  She is now having trouble walking for more than 5 minutes or so without pain.  This is dramatically impacted her ability to go about her day-to-day life as she has severe discomfort down her right leg and into her buttocks after standing or walking.  She gets numbness and tingling in her right anterior medial calf.  She also gets tingling in her upper leg and pain into her private area on the right side.  Laying down and heat and ice help but do not eliminate her pain.  She has tried physical therapy in 1-2 visits without any improvement.  She then had to stop physical therapy as she was undergoing right shoulder intervention.  Bowel/Bladder Dysfunction: none  Conservative measures:  Physical therapy:  has participated in 2 visits at [facility] (09/23/22 and 09/29/22) Multimodal medical therapy including regular antiinflammatories:  prednisone, meloxicam, robaxin, gabapentin, ibuprofen, methocarbamol Injections:  has received epidural steroid injections at [facility] 08/06/22: Right L4-5 and L5-S TFESI (minimal relief)  Past Surgery: neck surgery x 2 in [place] about 15 years ago  [name] has no symptoms of cervical myelopathy.  The symptoms are causing a significant impact on the patient's life.   I have utilized the care everywhere function in epic to review the outside records available from external health systems.  Review of Systems:  A 10 point review of systems is negative, except for the pertinent positives and negatives detailed in the HPI.  Past Medical History: Past Medical History:  Diagnosis Date   Anxiety    Arthritis    Chronic venous  insufficiency    Depression    High cholesterol    History of kidney stones    Hypertension    Plantar fasciitis, bilateral 05/25/2013   RLS (restless legs syndrome)    Type 2 diabetes mellitus ([place])    Vitamin D deficiency     Past Surgical History: Past Surgical History:  Procedure Laterality Date   AUGMENTATION MAMMAPLASTY     BICEPT TENODESIS Right 10/16/2022   Procedure: BICEPS TENOTOMY;  Surgeon: Thornton Park, MD;  Location: [facility] ORS;  Service: Orthopedics;  Laterality: Right;   COLONOSCOPY WITH PROPOFOL N/A 09/14/2020   Procedure: COLONOSCOPY WITH PROPOFOL;  Surgeon: Jonathon Bellows, MD;  Location: [facility] ENDOSCOPY;  Service: Gastroenterology;  Laterality: N/A;   CYSTOSCOPY     CYSTOSCOPY W/ RETROGRADES Bilateral 11/26/2021   Procedure: CYSTOSCOPY WITH RETROGRADE PYELOGRAM;  Surgeon: Abbie Sons, MD;  Location: [facility] ORS;  Service: Urology;  Laterality: Bilateral;   ESOPHAGOGASTRIC FUNDOPLICATION  [id number]   at [facility], for [place]     x2   PARTIAL HYSTERECTOMY     PLACEMENT OF BREAST IMPLANTS     right hand trigger release  03/2021   SHOULDER ARTHROSCOPY WITH OPEN ROTATOR CUFF REPAIR AND DISTAL CLAVICLE ACROMINECTOMY Right 10/16/2022   Procedure: SHOULDER ARTHROSCOPY WITH OPEN ROTATOR CUFF REPAIR AND DISTAL CLAVICLE EXCISION, SUBACROMIAL DECOMPRESSION;  Surgeon: Thornton Park, MD;  Location: [facility] ORS;  Service: Orthopedics;  Laterality: Right;    Allergies: Allergies as of 11/25/2022 - Review Complete 11/25/2022  Allergen Reaction Noted   Codeine Itching 07/16/2013    Medications: No outpatient medications have been marked as taking for the 11/25/22 encounter (Office Visit) with Meade Maw, MD.    Social History: Social History   Tobacco Use   Smoking status: Never   Smokeless tobacco: Never  Vaping Use   Vaping Use: Never used  Substance Use Topics   Alcohol use: Yes    Comment: drink occ   Drug  use: No    Family Medical History: Family History  Problem Relation Age of Onset   Heart disease Mother    Depression Mother    Anxiety disorder Mother    Cancer Mother        retinal   Breast cancer Neg Hx     Physical Examination: Vitals:   11/25/22 0836  BP: (!) 149/83  Pulse: 80    General: Patient is well developed, well nourished, calm, collected, and in no apparent distress. Attention to examination is appropriate.  Neck:   Supple.  Full range of motion.  Respiratory: Patient is breathing without any difficulty.   NEUROLOGICAL:     Awake, alert, oriented to person, place, and time.  Speech is clear and fluent.   Cranial Nerves: Pupils equal round and reactive to light.  Facial tone is symmetric.  Facial sensation is symmetric. Shoulder shrug is symmetric. Tongue protrusion is midline.  There is no pronator drift.  ROM of spine: full.    Strength: Side Biceps Triceps Deltoid Interossei Grip Wrist Ext. Wrist Flex.  R 5 5 5 5 5 5 5  $ L 5 5 5 5 5 5 5   $ Side Iliopsoas Quads Hamstring PF DF EHL  R 5 4 5 5 5 5  $ L 5 5 5 5 5 5   $ Reflexes are 1+ and symmetric at the biceps, triceps, brachioradialis, patella and achilles.   Hoffman's is absent.   Bilateral upper and lower extremity sensation is intact to light touch.    No evidence of dysmetria noted.  Gait is antalgic.     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  Severe narrowing of right L4-5 neuroforamen impinging on the exiting right L4 nerve root.  Severe bilateral L5-S1 facet arthrosis with trace joint effusions and 2 mm anterolisthesis.  Mild narrowing of the bilateral L5-S1 neuroforamina with abutment of the exiting L5 nerve roots.  I reviewed her CT chest abdomen  pelvis from 2014 and from 2022.  Over that time, she developed a worsening anterolisthesis at L4-5 but had stable findings at L5-S1.  On the 2022 scan, she also had substantial facet arthrosis with development of vacuum disc phenomenon at L4-5.   I have personally reviewed the images and agree with the above interpretation.  Assessment and Plan: Ms. Ramson is a pleasant 62 y.o. female with worsening anterolisthesis of L4 on L5 causing back pain with right leg pain.  She is developed severe lumbar stenosis with symptoms of neurogenic claudication due to compression of the L4 and L5 nerve roots.  On review of her old imaging, this has been progressive over time with worsening imaging findings over the past 10 years.  This implies development of lumbar instability over time.  At this point, further conservative management is not indicated.  She has worsening imaging findings as  well as weakness on examination.  She will continue to develop additional symptoms as her degenerative anterolisthesis worsens with time.  Her L5-S1 findings are stable.  I recommended L4-5 lateral lumbar interbody fusion to address her loss of disc height and loss of foraminal height with posterior fusion L4-5.  This approach will address her foraminal compression of the L4 nerve roots as well as her lateral recess and central stenosis at L4-5.  I think this has a 70 to 80% chance of improvement in her symptoms.  I discussed the planned procedure at length with the patient, including the risks, benefits, alternatives, and indications. The risks discussed include but are not limited to bleeding, infection, need for reoperation, spinal fluid leak, stroke, vision loss, anesthetic complication, coma, paralysis, and even death. I also described the possibility of psoas weakness and paresthesias. I described in detail that improvement was not guaranteed.  The patient expressed understanding of these risks, and asked that we proceed  with surgery. I described the surgery in layman's terms, and gave ample opportunity for questions, which were answered to the best of my ability.  She will touch base with her primary care provider.  It is time for her to recheck her A1c.  I have mentioned to her that I would like her to have an A1c of under 7.5% to proceed with surgical intervention.  Her prior A1c was taken after she had had an injection which caused an exacerbation of her blood sugar control.  She reports that her recent control has been very good.  I expect that she will have A1c under 7.5% when she has this rechecked.  I have offered to order it for her, but she would like to talk to her primary care provider.    Thank you for involving me in the care of this patient.      Aricela Bertagnolli K. Izora Ribas MD, [facility] Neurosurgery

## 2022-11-24 NOTE — Progress Notes (Unsigned)
Referring Physician:  No referring provider defined for this encounter.  Primary Physician:  Olin Hauser, DO  History of Present Illness: 11/25/2022 [name] is here today with a chief complaint of back and right leg pain.  She has been having pain for more than 2 years worsening over time.  She is now having trouble walking for more than 5 minutes or so without pain.  This is dramatically impacted her ability to go about her day-to-day life as she has severe discomfort down her right leg and into her buttocks after standing or walking.  She gets numbness and tingling in her right anterior medial calf.  She also gets tingling in her upper leg and pain into her private area on the right side.  Laying down and heat and ice help but do not eliminate her pain.  She has tried physical therapy in 1-2 visits without any improvement.  She then had to stop physical therapy as she was undergoing right shoulder intervention.  Bowel/Bladder Dysfunction: none  Conservative measures:  Physical therapy:  has participated in 2 visits at [facility] (09/23/22 and 09/29/22) Multimodal medical therapy including regular antiinflammatories:  prednisone, meloxicam, robaxin, gabapentin, ibuprofen, methocarbamol Injections:  has received epidural steroid injections at [facility] 08/06/22: Right L4-5 and L5-S TFESI (minimal relief)  Past Surgery: neck surgery x 2 in [place] about 15 years ago  Andrea Mcdonald has no symptoms of cervical myelopathy.  The symptoms are causing a significant impact on the patient's life.   I have utilized the care everywhere function in epic to review the outside records available from external health systems.  Review of Systems:  A 10 point review of systems is negative, except for the pertinent positives and negatives detailed in the HPI.  Past Medical History: Past Medical History:  Diagnosis Date   Anxiety    Arthritis    Chronic venous  insufficiency    Depression    High cholesterol    History of kidney stones    Hypertension    Plantar fasciitis, bilateral 05/25/2013   RLS (restless legs syndrome)    Type 2 diabetes mellitus ([place])    Vitamin D deficiency     Past Surgical History: Past Surgical History:  Procedure Laterality Date   AUGMENTATION MAMMAPLASTY     BICEPT TENODESIS Right 10/16/2022   Procedure: BICEPS TENOTOMY;  Surgeon: Thornton Park, MD;  Location: [facility] ORS;  Service: Orthopedics;  Laterality: Right;   COLONOSCOPY WITH PROPOFOL N/A 09/14/2020   Procedure: COLONOSCOPY WITH PROPOFOL;  Surgeon: Jonathon Bellows, MD;  Location: [facility] ENDOSCOPY;  Service: Gastroenterology;  Laterality: N/A;   CYSTOSCOPY     CYSTOSCOPY W/ RETROGRADES Bilateral 11/26/2021   Procedure: CYSTOSCOPY WITH RETROGRADE PYELOGRAM;  Surgeon: Abbie Sons, MD;  Location: [facility] ORS;  Service: Urology;  Laterality: Bilateral;   ESOPHAGOGASTRIC FUNDOPLICATION  [id number]   at [facility], for [place]     x2   PARTIAL HYSTERECTOMY     PLACEMENT OF BREAST IMPLANTS     right hand trigger release  03/2021   SHOULDER ARTHROSCOPY WITH OPEN ROTATOR CUFF REPAIR AND DISTAL CLAVICLE ACROMINECTOMY Right 10/16/2022   Procedure: SHOULDER ARTHROSCOPY WITH OPEN ROTATOR CUFF REPAIR AND DISTAL CLAVICLE EXCISION, SUBACROMIAL DECOMPRESSION;  Surgeon: Thornton Park, MD;  Location: [facility] ORS;  Service: Orthopedics;  Laterality: Right;    Allergies: Allergies as of 11/25/2022 - Review Complete 11/25/2022  Allergen Reaction Noted   Codeine Itching 07/16/2013    Medications: No outpatient medications have been marked as taking for the 11/25/22 encounter (Office Visit) with Meade Maw, MD.    Social History: Social History   Tobacco Use   Smoking status: Never   Smokeless tobacco: Never  Vaping Use   Vaping Use: Never used  Substance Use Topics   Alcohol use: Yes    Comment: drink occ   Drug  use: No    Family Medical History: Family History  Problem Relation Age of Onset   Heart disease Mother    Depression Mother    Anxiety disorder Mother    Cancer Mother        retinal   Breast cancer Neg Hx     Physical Examination: Vitals:   11/25/22 0836  BP: (!) 149/83  Pulse: 80    General: Patient is well developed, well nourished, calm, collected, and in no apparent distress. Attention to examination is appropriate.  Neck:   Supple.  Full range of motion.  Respiratory: Patient is breathing without any difficulty.   NEUROLOGICAL:     Awake, alert, oriented to person, place, and time.  Speech is clear and fluent.   Cranial Nerves: Pupils equal round and reactive to light.  Facial tone is symmetric.  Facial sensation is symmetric. Shoulder shrug is symmetric. Tongue protrusion is midline.  There is no pronator drift.  ROM of spine: full.    Strength: Side Biceps Triceps Deltoid Interossei Grip Wrist Ext. Wrist Flex.  R '5 5 5 5 5 5 5  '$ L '5 5 5 5 5 5 5   '$ Side Iliopsoas Quads Hamstring PF DF EHL  R '5 4 5 5 5 5  '$ L '5 5 5 5 5 5   '$ Reflexes are 1+ and symmetric at the biceps, triceps, brachioradialis, patella and achilles.   Hoffman's is absent.   Bilateral upper and lower extremity sensation is intact to light touch.    No evidence of dysmetria noted.  Gait is antalgic.     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  Severe narrowing of right L4-5 neuroforamen impinging on the exiting right L4 nerve root.  Severe bilateral L5-S1 facet arthrosis with trace joint effusions and 2 mm anterolisthesis.  Mild narrowing of the bilateral L5-S1 neuroforamina with abutment of the exiting L5 nerve roots.  I reviewed her CT chest abdomen  pelvis from 2014 and from 2022.  Over that time, she developed a worsening anterolisthesis at L4-5 but had stable findings at L5-S1.  On the 2022 scan, she also had substantial facet arthrosis with development of vacuum disc phenomenon at L4-5.   I have personally reviewed the images and agree with the above interpretation.  Assessment and Plan: Ms. Kaiser is a pleasant 62 y.o. female with worsening anterolisthesis of L4 on L5 causing back pain with right leg pain.  She is developed severe lumbar stenosis with symptoms of neurogenic claudication.  On review of her old imaging, this has been progressive over time with worsening imaging findings over the past 10 years.  This implies development of lumbar instability over time.  At this point, further conservative management is not indicated.  She has worsening imaging findings as well as weakness on examination.  She will continue to  develop additional symptoms as her degenerative anterolisthesis worsens with time.  Her L5-S1 findings are stable.  I recommended L4-5 lateral lumbar interbody fusion to address her loss of disc height and loss of foraminal height with posterior fusion L4-5.  This approach will address her foraminal compression of the L4 nerve roots as well as her lateral recess and central stenosis at L4-5.  I think this has a 70 to 80% chance of improvement in her symptoms.  I discussed the planned procedure at length with the patient, including the risks, benefits, alternatives, and indications. The risks discussed include but are not limited to bleeding, infection, need for reoperation, spinal fluid leak, stroke, vision loss, anesthetic complication, coma, paralysis, and even death. I also described the possibility of psoas weakness and paresthesias. I described in detail that improvement was not guaranteed.  The patient expressed understanding of these risks, and asked that we proceed with surgery. I described the surgery in layman's  terms, and gave ample opportunity for questions, which were answered to the best of my ability.  She will touch base with her primary care provider.  It is time for her to recheck her A1c.  I have mentioned to her that I would like her to have an A1c of under 7.5% to proceed with surgical intervention.  Her prior A1c was taken after she had had an injection which caused an exacerbation of her blood sugar control.  She reports that her recent control has been very good.  I expect that she will have A1c under 7.5% when she has this rechecked.  I have offered to order it for her, but she would like to talk to her primary care provider.    Thank you for involving me in the care of this patient.      Zair Borawski K. Izora Ribas MD, [facility] Neurosurgery

## 2022-11-25 ENCOUNTER — Encounter: Payer: Self-pay | Admitting: Neurosurgery

## 2022-11-25 ENCOUNTER — Ambulatory Visit: Payer: BC Managed Care – PPO | Admitting: Neurosurgery

## 2022-11-25 ENCOUNTER — Telehealth: Payer: Self-pay

## 2022-11-25 ENCOUNTER — Other Ambulatory Visit: Payer: Self-pay

## 2022-11-25 ENCOUNTER — Other Ambulatory Visit
Admission: RE | Admit: 2022-11-25 | Discharge: 2022-11-25 | Disposition: A | Discharge status: Home / Self Care | Payer: BC Managed Care – PPO | Attending: Neurosurgery | Admitting: Neurosurgery

## 2022-11-25 VITALS — BP 149/83 | HR 80 | Ht 65.0 in | Wt 174.2 lb

## 2022-11-25 DIAGNOSIS — E1169 Type 2 diabetes mellitus with other specified complication: Secondary | ICD-10-CM | POA: Diagnosis not present

## 2022-11-25 DIAGNOSIS — M532X6 Spinal instabilities, lumbar region: Secondary | ICD-10-CM

## 2022-11-25 DIAGNOSIS — M48062 Spinal stenosis, lumbar region with neurogenic claudication: Secondary | ICD-10-CM | POA: Diagnosis not present

## 2022-11-25 DIAGNOSIS — G8929 Other chronic pain: Secondary | ICD-10-CM

## 2022-11-25 DIAGNOSIS — M5441 Lumbago with sciatica, right side: Secondary | ICD-10-CM | POA: Diagnosis not present

## 2022-11-25 DIAGNOSIS — Z01818 Encounter for other preprocedural examination: Secondary | ICD-10-CM

## 2022-11-25 DIAGNOSIS — M4316 Spondylolisthesis, lumbar region: Secondary | ICD-10-CM

## 2022-11-25 LAB — HEMOGLOBIN A1C
Hgb A1c MFr Bld: 7.5 % — ABNORMAL HIGH (ref 4.8–5.6)
Mean Plasma Glucose: 168.55 mg/dL

## 2022-11-25 NOTE — Patient Instructions (Signed)
Please see below for information in regards to your upcoming surgery:  Planned surgery: L4-5 lateral lumbar interbody fusion   Surgery date: to be determined based on A1C - you will find out your arrival time the business day before your surgery.   Pre-op appointment at [facility]: we will call you with a date/time for this. Pre-admit testing is located on the first floor of the [facility], [place], [address]. Please bring all prescriptions in the original prescription bottles to your appointment, even if you have reviewed medications by phone with a pharmacy representative. Pre-op labs may be done at your pre-op appointment. You are not required to fast for these labs. Should you need to change your pre-op appointment, please call Pre-admit testing at [phone].    Surgical clearance: we will send a clearance form to Dr Parks Ranger once we have a surgery date   Metformin - should be held for 2 days prior to surgery Trulicity - should be held 7 days prior to surgery    NSAIDS (Non-steroidal anti-inflammatory drugs): because you are having a fusion, no NSAIDS (such as ibuprofen, aleve, naproxen, meloxicam, diclofenac) for 3 months after surgery. Celebrex is an exception. Tylenol is ok because it is not an NSAID.    Because you are having a fusion: for appointments after your 2 week follow-up: please arrive at the [facility] outpatient imaging center ([place], [place]) or [facility] one hour prior to your appointment for x-rays. This applies to every appointment after your 2 week follow-up. Failure to do so may result in your appointment being rescheduled.   If you have FMLA/disability paperwork, please drop it off or fax it to [phone], attention [place].   We can be reached by phone or mychart 8am-4pm, Monday-Friday. If you have any questions/concerns before or after surgery, you can  reach [place] at [phone], or you can send a mychart message. If you have a concern after hours that cannot wait until normal business hours, you can call [phone] and ask to page the neurosurgeon on call for [place].   Appointments/FMLA & disability paperwork: [place] Nurse: Ophelia Shoulder  Medical assistant: Raquel Sarna Physician Assistant's: Cooper Render & Geronimo Boot Surgeon: Meade Maw, MD

## 2022-11-25 NOTE — Telephone Encounter (Signed)
I spoke with the patient informed her of her A1c results. She chose 12/10/22.  She asked me to notify you of a couple of things in case it changes anything about her surgery:  She states she initially told you the pain was going down the front of her right leg but on the way home she noticed it is actually going down the back of her leg from her buttocks almost to her calf  She also wanted to make sure you are aware she is not allowed to raise her right arm above her head because of her recent shoulder surgery (in case this is an issue with positioning for surgery). The highest she can raise it is up to her breast.

## 2022-11-26 NOTE — Telephone Encounter (Signed)
I notified the patient of Dr Rhea Bleacher response. I also put a note about her shoulder in the special needs section of her OR case posting

## 2022-11-27 ENCOUNTER — Ambulatory Visit: Payer: BC Managed Care – PPO | Admitting: Family Medicine

## 2022-11-27 ENCOUNTER — Encounter: Payer: Self-pay | Admitting: Family Medicine

## 2022-11-27 VITALS — BP 120/64 | HR 79 | Ht 65.0 in | Wt 170.2 lb

## 2022-11-27 DIAGNOSIS — M5136 Other intervertebral disc degeneration, lumbar region: Secondary | ICD-10-CM | POA: Diagnosis not present

## 2022-11-27 DIAGNOSIS — E1169 Type 2 diabetes mellitus with other specified complication: Secondary | ICD-10-CM | POA: Diagnosis not present

## 2022-11-27 DIAGNOSIS — Z01818 Encounter for other preprocedural examination: Secondary | ICD-10-CM

## 2022-11-27 NOTE — Patient Instructions (Addendum)
Thank you for coming to the office today.  Labs today for pre op  Recent Labs    08/25/22 1625 11/25/22 0950  HGBA1C 11.3* 7.5*   Follow the med holding instructions from the surgery team  We will request a copy of the paperwork to fax to their office once we receive it and have lab results.  Otherwise, if no issues on labs - you are cleared to proceed with the spinal surgery   Please schedule a Follow-up Appointment to: No follow-ups on file.  If you have any other questions or concerns, please feel free to call the office or send a message through [place]. You may also schedule an earlier appointment if necessary.  Additionally, you may be receiving a survey about your experience at our office within a few days to 1 week by e-mail or mail. We value your feedback.  Nobie Putnam, DO [place]

## 2022-11-27 NOTE — Progress Notes (Addendum)
Subjective:    Patient ID: MONIA TIMMERS, female    DOB: 1961/10/03, 62 y.o.   MRN: [id number]  ROBBYN HODKINSON is a 62 y.o. female presenting on 11/27/2022 for pre op clearance and Back Pain   HPI  Presho Anticipated upcoming surgery - L4-L5 Lumbar Spinal Fusion, by Dr Izora Ribas (at [facility]) in approximately 2 weeks 12/10/22  Last visit with me 08/2022, for for same problems, see note for background information. With chronic back pain Lumbar DDD - Interval update with has followed with [facility] and has surgery scheduled.  Type 2 Diabetes Recently checked A1c down to 7.5, well controlled.  She is doing well on Trulicity '3mg'$  weekly with good results. She has been advised already to hold 7 days prior GLP1 and Metformin accordingly.  Regarding surgical and anesthesia history: - Known history of several major surgeries in past, and has tolerated general anesthesia well without problem, complication or allergy. No family history of problem with anesthesia - Able to tolerate regular exercise up to >4 METs, walking up flight of stairs - No known history of cardiovascular disease. Never had MI or known CAD. - No known history of pulmonary disease. Never smoked  - Denies exertional symptoms of chest pain or tightness, dyspnea, coughing, apnea, syncopal episodes, palpitations  Past Surgical History:  Procedure Laterality Date   AUGMENTATION MAMMAPLASTY     BICEPT TENODESIS Right 10/16/2022   Procedure: BICEPS TENOTOMY;  Surgeon: Thornton Park, MD;  Location: [facility] ORS;  Service: Orthopedics;  Laterality: Right;   COLONOSCOPY WITH PROPOFOL N/A 09/14/2020   Procedure: COLONOSCOPY WITH PROPOFOL;  Surgeon: Jonathon Bellows, MD;  Location: [facility] ENDOSCOPY;  Service: Gastroenterology;  Laterality: N/A;   CYSTOSCOPY     CYSTOSCOPY W/ RETROGRADES Bilateral 11/26/2021   Procedure: CYSTOSCOPY WITH RETROGRADE PYELOGRAM;  Surgeon: Abbie Sons, MD;   Location: [facility] ORS;  Service: Urology;  Laterality: Bilateral;   ESOPHAGOGASTRIC FUNDOPLICATION  [id number]   at [facility], for [place]     x2   PARTIAL HYSTERECTOMY     PLACEMENT OF BREAST IMPLANTS     right hand trigger release  03/2021   SHOULDER ARTHROSCOPY WITH OPEN ROTATOR CUFF REPAIR AND DISTAL CLAVICLE ACROMINECTOMY Right 10/16/2022   Procedure: SHOULDER ARTHROSCOPY WITH OPEN ROTATOR CUFF REPAIR AND DISTAL CLAVICLE EXCISION, SUBACROMIAL DECOMPRESSION;  Surgeon: Thornton Park, MD;  Location: [facility] ORS;  Service: Orthopedics;  Laterality: Right;       11/27/2022    9:56 AM 07/23/2020   10:37 AM 07/21/2019   10:24 AM  Depression screen PHQ 2/9  Decreased Interest 1 0 0  Down, Depressed, Hopeless 1 0 0  PHQ - 2 Score 2 0 0  Altered sleeping 1 0 0  Tired, decreased energy 1 0 1  Change in appetite 0 0 1  Feeling bad or failure about yourself  0 0 0  Trouble concentrating 1 0 1  Moving slowly or fidgety/restless 0 0 0  Suicidal thoughts 0 0 0  PHQ-9 Score 5 0 3  Difficult doing work/chores Not difficult at all Not difficult at all Not difficult at all    Social History   Tobacco Use   Smoking status: Never   Smokeless tobacco: Never  Vaping Use   Vaping Use: Never used  Substance Use Topics   Alcohol use: Yes    Comment: drink occ   Drug use: No  Review of Systems Per HPI unless specifically indicated above     Objective:    BP 120/64   Pulse 79   Ht '5\' 5"'$  (1.651 m)   Wt 170 lb 3.2 oz (77.2 kg)   SpO2 98%   BMI 28.32 kg/m   Wt Readings from Last 3 Encounters:  11/27/22 170 lb 3.2 oz (77.2 kg)  11/25/22 174 lb 3.2 oz (79 kg)  10/13/22 170 lb (77.1 kg)    Physical Exam Vitals and nursing note reviewed.  Constitutional:      General: She is not in acute distress.    Appearance: She is well-developed. She is not diaphoretic.     Comments: Well-appearing, comfortable, cooperative  HENT:     Head:  Normocephalic and atraumatic.  Eyes:     General:        Right eye: No discharge.        Left eye: No discharge.     Conjunctiva/sclera: Conjunctivae normal.  Neck:     Thyroid: No thyromegaly.  Cardiovascular:     Rate and Rhythm: Normal rate and regular rhythm.     Heart sounds: Normal heart sounds. No murmur heard. Pulmonary:     Effort: Pulmonary effort is normal. No respiratory distress.     Breath sounds: Normal breath sounds. No wheezing or rales.  Musculoskeletal:        General: Normal range of motion.     Cervical back: Normal range of motion and neck supple.  Lymphadenopathy:     Cervical: No cervical adenopathy.  Skin:    General: Skin is warm and dry.     Findings: No erythema or rash.  Neurological:     Mental Status: She is alert and oriented to person, place, and time.  Psychiatric:        Behavior: Behavior normal.     Comments: Well groomed, good eye contact, normal speech and thoughts        Recent Labs    08/25/22 1625 11/25/22 0950  HGBA1C 11.3* 7.5*    Last EKG Reviewed 10/14/22 - will fax copy with paperwork.   Results for orders placed or performed in visit on 11/27/22  COMPLETE METABOLIC PANEL WITH GFR  Result Value Ref Range   Glucose, Bld 141 (H) 65 - 99 mg/dL   BUN 11 7 - 25 mg/dL   Creat 0.74 0.50 - 1.05 mg/dL   eGFR 91 > OR = 60 mL/min/1.80m   BUN/Creatinine Ratio SEE NOTE: 6 - 22 (calc)   Sodium 140 135 - 146 mmol/L   Potassium 4.4 3.5 - 5.3 mmol/L   Chloride 104 98 - 110 mmol/L   CO2 28 20 - 32 mmol/L   Calcium 9.9 8.6 - 10.4 mg/dL   Total Protein 6.5 6.1 - 8.1 g/dL   Albumin 4.4 3.6 - 5.1 g/dL   Globulin 2.1 1.9 - 3.7 g/dL (calc)   AG Ratio 2.1 1.0 - 2.5 (calc)   Total Bilirubin 0.8 0.2 - 1.2 mg/dL   Alkaline phosphatase (APISO) 61 37 - 153 U/L   AST 13 10 - 35 U/L   ALT 14 6 - 29 U/L  CBC with Differential/Platelet  Result Value Ref Range   WBC 5.3 3.8 - 10.8 Thousand/uL   RBC 4.05 3.80 - 5.10 Million/uL    Hemoglobin 12.2 11.7 - 15.5 g/dL   HCT 36.5 35.0 - 45.0 %   MCV 90.1 80.0 - 100.0 fL   MCH 30.1 27.0 - 33.0 pg  MCHC 33.4 32.0 - 36.0 g/dL   RDW 12.4 11.0 - 15.0 %   Platelets 171 140 - 400 Thousand/uL   MPV 10.5 7.5 - 12.5 fL   Neutro Abs 3,244 1,500 - 7,800 cells/uL   Lymphs Abs 1,606 850 - 3,900 cells/uL   Absolute Monocytes 339 200 - 950 cells/uL   Eosinophils Absolute 80 15 - 500 cells/uL   Basophils Absolute 32 0 - 200 cells/uL   Neutrophils Relative % 61.2 %   Total Lymphocyte 30.3 %   Monocytes Relative 6.4 %   Eosinophils Relative 1.5 %   Basophils Relative 0.6 %      Assessment & Plan:   Problem List Items Addressed This Visit     Type 2 diabetes mellitus with other specified complication ([place])   Other Visit Diagnoses     Pre-op examination    -  Primary   Relevant Orders   COMPLETE METABOLIC PANEL WITH GFR   CBC with Differential/Platelet   DDD (degenerative disc disease), lumbar           Pre-op clearance for non-cardiac surgery today, Lumbar Spine L4-L5 Fusion (intermediate risk), general anesthesia. - Previously tolerated prior surgeries with general anesthesia - No known cardiac hx. Appropriate functional status >4 METs - Never smoker  Plan: Cleared for elective surgery Completed copy of pre op eval paperwork will be faxed back to Dr Izora Ribas office [facility] on Mon 12/01/22 Will check CMET + CBC for Cr monitoring Results reviewed now updated. All labs normal range. Mild glucose elevation but A1c is controlled. No repeat EKG today. Last done 10/14/22, see copy 5. BP is controlledElevated BP today improved on re-check, no change recommended prior to surgery 6. Type 2 diabetes is well controlled A1c 7.5 on GLP1, she has instructions for holding medication pre op   No orders of the defined types were placed in this encounter.     Follow up plan: Return if symptoms worsen or fail to improve.    Nobie Putnam, DO [facility] 11/27/2022, 10:05 AM

## 2022-11-28 LAB — COMPLETE METABOLIC PANEL WITH GFR
AG Ratio: 2.1 (calc) (ref 1.0–2.5)
ALT: 14 U/L (ref 6–29)
AST: 13 U/L (ref 10–35)
Albumin: 4.4 g/dL (ref 3.6–5.1)
Alkaline phosphatase (APISO): 61 U/L (ref 37–153)
BUN: 11 mg/dL (ref 7–25)
CO2: 28 mmol/L (ref 20–32)
Calcium: 9.9 mg/dL (ref 8.6–10.4)
Chloride: 104 mmol/L (ref 98–110)
Creat: 0.74 mg/dL (ref 0.50–1.05)
Globulin: 2.1 g/dL (calc) (ref 1.9–3.7)
Glucose, Bld: 141 mg/dL — ABNORMAL HIGH (ref 65–99)
Potassium: 4.4 mmol/L (ref 3.5–5.3)
Sodium: 140 mmol/L (ref 135–146)
Total Bilirubin: 0.8 mg/dL (ref 0.2–1.2)
Total Protein: 6.5 g/dL (ref 6.1–8.1)
eGFR: 91 mL/min/{1.73_m2} (ref >=60)

## 2022-11-28 LAB — CBC WITH DIFFERENTIAL/PLATELET
Absolute Monocytes: 339 cells/uL (ref 200–950)
Basophils Absolute: 32 cells/uL (ref 0–200)
Basophils Relative: 0.6 %
Eosinophils Absolute: 80 cells/uL (ref 15–500)
Eosinophils Relative: 1.5 %
HCT: 36.5 % (ref 35.0–45.0)
Hemoglobin: 12.2 g/dL (ref 11.7–15.5)
Lymphs Abs: 1606 cells/uL (ref 850–3900)
MCH: 30.1 pg (ref 27.0–33.0)
MCHC: 33.4 g/dL (ref 32.0–36.0)
MCV: 90.1 fL (ref 80.0–100.0)
MPV: 10.5 fL (ref 7.5–12.5)
Monocytes Relative: 6.4 %
Neutro Abs: 3244 cells/uL (ref 1500–7800)
Neutrophils Relative %: 61.2 %
Platelets: 171 10*3/uL (ref 140–400)
RBC: 4.05 10*6/uL (ref 3.80–5.10)
RDW: 12.4 % (ref 11.0–15.0)
Total Lymphocyte: 30.3 %
WBC: 5.3 10*3/uL (ref 3.8–10.8)

## 2022-12-01 DIAGNOSIS — M25611 Stiffness of right shoulder, not elsewhere classified: Secondary | ICD-10-CM | POA: Diagnosis not present

## 2022-12-01 DIAGNOSIS — M25511 Pain in right shoulder: Secondary | ICD-10-CM | POA: Diagnosis not present

## 2022-12-04 ENCOUNTER — Encounter
Admission: RE | Admit: 2022-12-04 | Discharge: 2022-12-04 | Disposition: A | Discharge status: Home / Self Care | Payer: BC Managed Care – PPO | Source: Ambulatory Visit | Attending: Neurosurgery | Admitting: Neurosurgery

## 2022-12-04 VITALS — BP 114/75 | HR 84 | Resp 14 | Ht 65.0 in | Wt 173.7 lb

## 2022-12-04 DIAGNOSIS — Z01812 Encounter for preprocedural laboratory examination: Secondary | ICD-10-CM | POA: Diagnosis not present

## 2022-12-04 DIAGNOSIS — Z01818 Encounter for other preprocedural examination: Secondary | ICD-10-CM

## 2022-12-04 HISTORY — DX: Other specified postprocedural states: Z98.890

## 2022-12-04 HISTORY — DX: Nausea with vomiting, unspecified: R11.2

## 2022-12-04 LAB — URINALYSIS, ROUTINE W REFLEX MICROSCOPIC
Bacteria, UA: NONE SEEN
Bilirubin Urine: NEGATIVE
Glucose, UA: >=500 mg/dL — AB
Hgb urine dipstick: NEGATIVE
Ketones, ur: NEGATIVE mg/dL
Nitrite: NEGATIVE
Protein, ur: NEGATIVE mg/dL
Specific Gravity, Urine: 1.027 (ref 1.005–1.030)
pH: 5 (ref 5.0–8.0)

## 2022-12-04 LAB — SURGICAL PCR SCREEN
MRSA, PCR: NEGATIVE
Staphylococcus aureus: NEGATIVE

## 2022-12-04 LAB — TYPE AND SCREEN
ABO/RH(D): O NEG
Antibody Screen: NEGATIVE

## 2022-12-04 NOTE — Patient Instructions (Addendum)
Your procedure is scheduled on:12-10-22 Wednesday Report to the Registration Desk on the 1st floor of the [place].Then proceed to the 2nd floor Surgery Desk To find out your arrival time, please call [phone] between 1PM - 3PM on:12-09-22 Tuesday If your arrival time is 6:00 am, do not arrive before that time as the [facility] entrance doors do not open until 6:00 am.  REMEMBER: Instructions that are not followed completely may result in serious medical risk, up to and including death; or upon the discretion of your surgeon and anesthesiologist your surgery may need to be rescheduled.  Do not eat food after midnight the night before surgery.  No gum chewing or hard candies.  You may however, drink Water up to 2 hours before you are scheduled to arrive for your surgery. Do not drink anything within 2 hours of your scheduled arrival time.  One week prior to surgery: Stop Anti-inflammatories (NSAIDS) such as meloxicam (MOBIC) , Advil, Aleve, Ibuprofen, Motrin, Naproxen, Naprosyn and Aspirin based products such as Excedrin, Goody's Powder, BC Powder.You may however, continue to take Tylenol if needed for pain up until the day of surgery  Stop [place] supplements/vitamins NOW (12-04-22) until after surgery. . Continue taking all prescribed medications with the exception of the following:Vitamin C, Hair,Skin and Nails, Cayenne Pepper,   [place] WITH A SIP OF WATER: -gabapentin (NEURONTIN)  -methocarbamol (ROBAXIN)   Stop your Dulaglutide (TRULICITY) 7 days prior to surgery-Last dose was on 11-30-22-Do NOT take again until AFTER surgery  Stop your metFORMIN (GLUCOPHAGE-XR) 2 days prior to surgery-Last dose will be on 12-07-22 Sunday  No Alcohol for 24 hours before or after surgery.  No Smoking including e-cigarettes for 24 hours before surgery.  No chewable tobacco products for at least 6 hours before surgery.  No nicotine patches  on the day of surgery.  Do not use any "recreational" drugs for at least a week (preferably 2 weeks) before your surgery.  Please be advised that the combination of cocaine and anesthesia may have negative outcomes, up to and including death. If you test positive for cocaine, your surgery will be cancelled.  On the morning of surgery brush your teeth with toothpaste and water, you may rinse your mouth with mouthwash if you wish. Do not swallow any toothpaste or mouthwash.  Use CHG Soap as directed on instruction sheet.  Do not wear jewelry, make-up, hairpins, clips or nail polish.  Do not wear lotions, powders, or perfumes.   Do not shave body hair from the neck down 48 hours before surgery.  Contact lenses, hearing aids and dentures may not be worn into surgery.  Do not bring valuables to the hospital. [facility] is not responsible for any missing/lost belongings or valuables.   Notify your doctor if there is any change in your medical condition (cold, fever, infection).  Wear comfortable clothing (specific to your surgery type) to the hospital.  After surgery, you can help prevent lung complications by doing breathing exercises.  Take deep breaths and cough every 1-2 hours. Your doctor may order a device called an Incentive Spirometer to help you take deep breaths. When coughing or sneezing, hold a pillow firmly against your incision with both hands. This is called "splinting." Doing this helps protect your incision. It also decreases belly discomfort.  If you are being admitted to the hospital overnight, leave your suitcase in the car. After surgery it may be brought to  your room.  In case of increased patient census, it may be necessary for you, the patient, to continue your postoperative care in the Same Day Surgery department.  If you are being discharged the day of surgery, you will not be allowed to drive home. You will need a responsible individual to drive you home and  stay with you for 24 hours after surgery.   If you are taking public transportation, you will need to have a responsible individual with you.  Please call the [place] Dept. at [phone] if you have any questions about these instructions.  Surgery Visitation Policy:  Patients undergoing a surgery or procedure may have two family members or support persons with them as long as the person is not COVID-19 positive or experiencing its symptoms.   Inpatient Visitation:    Visiting hours are 7 a.m. to 8 p.m. Up to four visitors are allowed at one time in a patient room. The visitors may rotate out with other people during the day. One designated support person (adult) may remain overnight.  Due to an increase in RSV and influenza rates and associated hospitalizations, children ages 54 and under will not be able to visit patients in [facility]. Masks continue to be strongly recommended.

## 2022-12-05 ENCOUNTER — Telehealth: Payer: Self-pay

## 2022-12-05 DIAGNOSIS — M25611 Stiffness of right shoulder, not elsewhere classified: Secondary | ICD-10-CM | POA: Diagnosis not present

## 2022-12-05 DIAGNOSIS — M25511 Pain in right shoulder: Secondary | ICD-10-CM | POA: Diagnosis not present

## 2022-12-05 NOTE — Telephone Encounter (Signed)
-----   Message from Peggyann Shoals sent at 12/05/2022  8:17 AM EST ----- Regarding: work note Contact: [phone] L4-5 XLIF/PSF on 12/10/22 Patient needs a letter for work with the date her surgery and how long she will be out. She is currently on FMLA for shoulder surgery they just need to know how much longer she will be out. She will pick it up.

## 2022-12-05 NOTE — Telephone Encounter (Signed)
Patient aware note is ready for pickup.

## 2022-12-05 NOTE — Telephone Encounter (Signed)
I wrote a note for her to be out for 12 weeks after surgery. If she is doing well and feeling up to it, she may be able to go back sooner on light duty.

## 2022-12-09 DIAGNOSIS — M25511 Pain in right shoulder: Secondary | ICD-10-CM | POA: Diagnosis not present

## 2022-12-09 DIAGNOSIS — M25611 Stiffness of right shoulder, not elsewhere classified: Secondary | ICD-10-CM | POA: Diagnosis not present

## 2022-12-09 MED ORDER — SODIUM CHLORIDE 0.9 % IV SOLN: INTRAVENOUS | Status: DC

## 2022-12-09 MED ORDER — CHLORHEXIDINE GLUCONATE 0.12 % MT SOLN: 15.0000 mL | Freq: Once | OROMUCOSAL | Status: AC

## 2022-12-09 MED ORDER — ORAL CARE MOUTH RINSE: 15.0000 mL | Freq: Once | OROMUCOSAL | Status: AC

## 2022-12-09 MED ORDER — CEFAZOLIN IN SODIUM CHLORIDE 2-0.9 GM/100ML-% IV SOLN
2.0000 g | Freq: Once | INTRAVENOUS | Status: DC
  Filled 2022-12-09: qty 100

## 2022-12-09 MED ORDER — CEFAZOLIN SODIUM-DEXTROSE 2-4 GM/100ML-% IV SOLN
2.0000 g | INTRAVENOUS | Status: AC
  Administered 2022-12-10: 2 g via INTRAVENOUS

## 2022-12-09 MED ORDER — VANCOMYCIN HCL IN DEXTROSE 1-5 GM/200ML-% IV SOLN: 1000.0000 mg | Freq: Once | INTRAVENOUS | Status: AC

## 2022-12-09 MED ORDER — FAMOTIDINE 20 MG PO TABS: 20.0000 mg | ORAL_TABLET | Freq: Once | ORAL | Status: AC

## 2022-12-10 ENCOUNTER — Inpatient Hospital Stay: Payer: BC Managed Care – PPO

## 2022-12-10 ENCOUNTER — Other Ambulatory Visit: Payer: Self-pay

## 2022-12-10 ENCOUNTER — Encounter
Admission: RE | Disposition: A | Discharge status: Home Health | Payer: Self-pay | Source: Home / Self Care | Attending: Neurosurgery

## 2022-12-10 ENCOUNTER — Inpatient Hospital Stay: Payer: BC Managed Care – PPO | Admitting: Certified Registered"

## 2022-12-10 ENCOUNTER — Encounter: Payer: Self-pay | Admitting: Neurosurgery

## 2022-12-10 ENCOUNTER — Inpatient Hospital Stay
Admission: RE | Admit: 2022-12-10 | Discharge: 2022-12-12 | DRG: 455 | Disposition: A | Discharge status: Home Health | Payer: BC Managed Care – PPO | Attending: Neurosurgery | Admitting: Neurosurgery

## 2022-12-10 ENCOUNTER — Inpatient Hospital Stay: Payer: BC Managed Care – PPO | Admitting: Urgent Care

## 2022-12-10 DIAGNOSIS — M532X6 Spinal instabilities, lumbar region: Secondary | ICD-10-CM

## 2022-12-10 DIAGNOSIS — M4316 Spondylolisthesis, lumbar region: Principal | ICD-10-CM

## 2022-12-10 DIAGNOSIS — E119 Type 2 diabetes mellitus without complications: Secondary | ICD-10-CM | POA: Diagnosis present

## 2022-12-10 DIAGNOSIS — Z79899 Other long term (current) drug therapy: Secondary | ICD-10-CM | POA: Diagnosis not present

## 2022-12-10 DIAGNOSIS — Z7984 Long term (current) use of oral hypoglycemic drugs: Secondary | ICD-10-CM | POA: Diagnosis not present

## 2022-12-10 DIAGNOSIS — M5441 Lumbago with sciatica, right side: Secondary | ICD-10-CM | POA: Diagnosis not present

## 2022-12-10 DIAGNOSIS — Z7985 Long-term (current) use of injectable non-insulin antidiabetic drugs: Secondary | ICD-10-CM | POA: Diagnosis not present

## 2022-12-10 DIAGNOSIS — Z981 Arthrodesis status: Secondary | ICD-10-CM | POA: Diagnosis not present

## 2022-12-10 DIAGNOSIS — G8929 Other chronic pain: Secondary | ICD-10-CM

## 2022-12-10 DIAGNOSIS — I10 Essential (primary) hypertension: Secondary | ICD-10-CM | POA: Diagnosis present

## 2022-12-10 DIAGNOSIS — Z885 Allergy status to narcotic agent status: Secondary | ICD-10-CM

## 2022-12-10 DIAGNOSIS — Z818 Family history of other mental and behavioral disorders: Secondary | ICD-10-CM

## 2022-12-10 DIAGNOSIS — Z01818 Encounter for other preprocedural examination: Secondary | ICD-10-CM

## 2022-12-10 DIAGNOSIS — E7800 Pure hypercholesterolemia, unspecified: Secondary | ICD-10-CM | POA: Diagnosis not present

## 2022-12-10 DIAGNOSIS — F32A Depression, unspecified: Secondary | ICD-10-CM | POA: Diagnosis present

## 2022-12-10 DIAGNOSIS — Z8249 Family history of ischemic heart disease and other diseases of the circulatory system: Secondary | ICD-10-CM | POA: Diagnosis not present

## 2022-12-10 DIAGNOSIS — Z808 Family history of malignant neoplasm of other organs or systems: Secondary | ICD-10-CM

## 2022-12-10 DIAGNOSIS — M48062 Spinal stenosis, lumbar region with neurogenic claudication: Secondary | ICD-10-CM | POA: Diagnosis not present

## 2022-12-10 DIAGNOSIS — M4319 Spondylolisthesis, multiple sites in spine: Secondary | ICD-10-CM | POA: Diagnosis not present

## 2022-12-10 HISTORY — PX: ANTERIOR LATERAL LUMBAR FUSION WITH PERCUTANEOUS SCREW 1 LEVEL: SHX5553

## 2022-12-10 HISTORY — PX: APPLICATION OF INTRAOPERATIVE CT SCAN: SHX6668

## 2022-12-10 LAB — GLUCOSE, CAPILLARY
Glucose-Capillary: 192 mg/dL — ABNORMAL HIGH (ref 70–99)
Glucose-Capillary: 290 mg/dL — ABNORMAL HIGH (ref 70–99)
Glucose-Capillary: 275 mg/dL — ABNORMAL HIGH (ref 70–99)
Glucose-Capillary: 285 mg/dL — ABNORMAL HIGH (ref 70–99)

## 2022-12-10 LAB — ABO/RH: ABO/RH(D): O NEG

## 2022-12-10 SURGERY — ANTERIOR LATERAL LUMBAR FUSION WITH PERCUTANEOUS SCREW 1 LEVEL
Anesthesia: General | Site: Spine Lumbar

## 2022-12-10 MED ORDER — HYDROMORPHONE HCL 1 MG/ML IJ SOLN
INTRAMUSCULAR | Status: DC | PRN
  Administered 2022-12-10 (×2): .5 mg via INTRAVENOUS

## 2022-12-10 MED ORDER — MIDAZOLAM HCL 2 MG/2ML IJ SOLN
INTRAMUSCULAR | Status: AC
  Filled 2022-12-10: qty 2

## 2022-12-10 MED ORDER — LIDOCAINE HCL (PF) 2 % IJ SOLN
INTRAMUSCULAR | Status: AC
  Filled 2022-12-10: qty 5

## 2022-12-10 MED ORDER — HYDROMORPHONE HCL 1 MG/ML IJ SOLN: 0.5000 mg | INTRAMUSCULAR | Status: DC | PRN

## 2022-12-10 MED ORDER — REMIFENTANIL HCL 1 MG IV SOLR
INTRAVENOUS | Status: AC
  Filled 2022-12-10: qty 1000

## 2022-12-10 MED ORDER — SODIUM CHLORIDE 0.9 % IV SOLN
INTRAVENOUS | Status: DC | PRN
  Administered 2022-12-10: .2 ug/kg/min via INTRAVENOUS

## 2022-12-10 MED ORDER — LACTATED RINGERS IV SOLN: INTRAVENOUS | Status: DC | PRN

## 2022-12-10 MED ORDER — KETAMINE HCL 10 MG/ML IJ SOLN
INTRAMUSCULAR | Status: DC | PRN
  Administered 2022-12-10: 20 mg via INTRAVENOUS
  Administered 2022-12-10: 30 mg via INTRAVENOUS

## 2022-12-10 MED ORDER — KETOROLAC TROMETHAMINE 30 MG/ML IJ SOLN
INTRAMUSCULAR | Status: DC | PRN
  Administered 2022-12-10: 30 mg via INTRAVENOUS

## 2022-12-10 MED ORDER — LISINOPRIL 5 MG PO TABS
5.0000 mg | ORAL_TABLET | Freq: Every day | ORAL | Status: DC
  Administered 2022-12-11 – 2022-12-12 (×2): 5 mg via ORAL
  Filled 2022-12-10 (×2): qty 1

## 2022-12-10 MED ORDER — PHENYLEPHRINE HCL-NACL 20-0.9 MG/250ML-% IV SOLN
INTRAVENOUS | Status: AC
  Filled 2022-12-10: qty 250

## 2022-12-10 MED ORDER — POLYETHYLENE GLYCOL 3350 17 G PO PACK: 17.0000 g | PACK | Freq: Every day | ORAL | Status: DC | PRN

## 2022-12-10 MED ORDER — BUPIVACAINE LIPOSOME 1.3 % IJ SUSP
INTRAMUSCULAR | Status: AC
  Filled 2022-12-10: qty 20

## 2022-12-10 MED ORDER — SUCCINYLCHOLINE CHLORIDE 200 MG/10ML IV SOSY
PREFILLED_SYRINGE | INTRAVENOUS | Status: AC
  Filled 2022-12-10: qty 10

## 2022-12-10 MED ORDER — CEFAZOLIN SODIUM-DEXTROSE 2-4 GM/100ML-% IV SOLN
INTRAVENOUS | Status: AC
  Filled 2022-12-10: qty 100

## 2022-12-10 MED ORDER — OXYCODONE HCL 5 MG PO TABS
ORAL_TABLET | ORAL | Status: AC
  Filled 2022-12-10: qty 1

## 2022-12-10 MED ORDER — HYDROMORPHONE HCL 1 MG/ML IJ SOLN
INTRAMUSCULAR | Status: AC
  Administered 2022-12-10: 0.5 mg via INTRAVENOUS
  Filled 2022-12-10: qty 1

## 2022-12-10 MED ORDER — CEFAZOLIN SODIUM 1 G IJ SOLR
INTRAMUSCULAR | Status: AC
  Filled 2022-12-10: qty 20

## 2022-12-10 MED ORDER — METHOCARBAMOL 1000 MG/10ML IJ SOLN
500.0000 mg | Freq: Four times a day (QID) | INTRAVENOUS | Status: DC | PRN
  Administered 2022-12-10: 500 mg via INTRAVENOUS
  Filled 2022-12-10: qty 500

## 2022-12-10 MED ORDER — ONDANSETRON HCL 4 MG/2ML IJ SOLN
4.0000 mg | Freq: Four times a day (QID) | INTRAMUSCULAR | Status: DC | PRN
  Administered 2022-12-12: 4 mg via INTRAVENOUS
  Filled 2022-12-10: qty 2

## 2022-12-10 MED ORDER — BUPIVACAINE-EPINEPHRINE 0.5% -1:200000 IJ SOLN
INTRAMUSCULAR | Status: DC | PRN
  Administered 2022-12-10: 5 mL
  Administered 2022-12-10: 4.5 mL

## 2022-12-10 MED ORDER — SODIUM CHLORIDE (PF) 0.9 % IJ SOLN
INTRAMUSCULAR | Status: DC | PRN
  Administered 2022-12-10: 60 mL via INTRAMUSCULAR

## 2022-12-10 MED ORDER — INSULIN ASPART 100 UNIT/ML IJ SOLN
0.0000 [IU] | Freq: Every day | INTRAMUSCULAR | Status: DC
  Administered 2022-12-10: 3 [IU] via SUBCUTANEOUS
  Filled 2022-12-10: qty 1

## 2022-12-10 MED ORDER — SODIUM CHLORIDE 0.9 % IV SOLN: INTRAVENOUS | Status: DC

## 2022-12-10 MED ORDER — 0.9 % SODIUM CHLORIDE (POUR BTL) OPTIME
TOPICAL | Status: DC | PRN
  Administered 2022-12-10: 500 mL

## 2022-12-10 MED ORDER — FAMOTIDINE 20 MG PO TABS
ORAL_TABLET | ORAL | Status: AC
  Administered 2022-12-10: 20 mg via ORAL
  Filled 2022-12-10: qty 1

## 2022-12-10 MED ORDER — ACETAMINOPHEN 10 MG/ML IV SOLN
INTRAVENOUS | Status: AC
  Filled 2022-12-10: qty 100

## 2022-12-10 MED ORDER — ONDANSETRON HCL 4 MG/2ML IJ SOLN
INTRAMUSCULAR | Status: DC | PRN
  Administered 2022-12-10: 4 mg via INTRAVENOUS

## 2022-12-10 MED ORDER — SODIUM CHLORIDE 0.9% FLUSH: 3.0000 mL | INTRAVENOUS | Status: DC | PRN

## 2022-12-10 MED ORDER — PROPOFOL 1000 MG/100ML IV EMUL
INTRAVENOUS | Status: AC
  Filled 2022-12-10: qty 100

## 2022-12-10 MED ORDER — MIDAZOLAM HCL 2 MG/2ML IJ SOLN
INTRAMUSCULAR | Status: DC | PRN
  Administered 2022-12-10: 2 mg via INTRAVENOUS

## 2022-12-10 MED ORDER — ENOXAPARIN SODIUM 40 MG/0.4ML IJ SOSY
40.0000 mg | PREFILLED_SYRINGE | INTRAMUSCULAR | Status: DC
  Administered 2022-12-11 – 2022-12-12 (×2): 40 mg via SUBCUTANEOUS
  Filled 2022-12-10 (×2): qty 0.4

## 2022-12-10 MED ORDER — OXYCODONE HCL 5 MG PO TABS
5.0000 mg | ORAL_TABLET | ORAL | Status: DC | PRN
  Administered 2022-12-11: 5 mg via ORAL
  Filled 2022-12-10: qty 1

## 2022-12-10 MED ORDER — ROSUVASTATIN CALCIUM 10 MG PO TABS
20.0000 mg | ORAL_TABLET | Freq: Every day | ORAL | Status: DC
  Administered 2022-12-10 – 2022-12-11 (×2): 20 mg via ORAL
  Filled 2022-12-10 (×2): qty 2

## 2022-12-10 MED ORDER — DEXAMETHASONE SODIUM PHOSPHATE 10 MG/ML IJ SOLN
INTRAMUSCULAR | Status: DC | PRN
  Administered 2022-12-10: 10 mg via INTRAVENOUS

## 2022-12-10 MED ORDER — ONDANSETRON HCL 4 MG/2ML IJ SOLN
INTRAMUSCULAR | Status: AC
  Filled 2022-12-10: qty 2

## 2022-12-10 MED ORDER — ONDANSETRON HCL 4 MG PO TABS: 4.0000 mg | ORAL_TABLET | Freq: Four times a day (QID) | ORAL | Status: DC | PRN

## 2022-12-10 MED ORDER — DEXAMETHASONE SODIUM PHOSPHATE 10 MG/ML IJ SOLN
INTRAMUSCULAR | Status: AC
  Filled 2022-12-10: qty 1

## 2022-12-10 MED ORDER — SURGIFLO WITH THROMBIN (HEMOSTATIC MATRIX KIT) OPTIME
TOPICAL | Status: DC | PRN
  Administered 2022-12-10: 1 via TOPICAL

## 2022-12-10 MED ORDER — FLEET ENEMA 7-19 GM/118ML RE ENEM: 1.0000 | ENEMA | Freq: Once | RECTAL | Status: DC | PRN

## 2022-12-10 MED ORDER — SODIUM CHLORIDE 0.9 % IV SOLN: 250.0000 mL | INTRAVENOUS | Status: DC

## 2022-12-10 MED ORDER — OXYCODONE HCL 5 MG PO TABS
5.0000 mg | ORAL_TABLET | Freq: Once | ORAL | Status: AC | PRN
  Administered 2022-12-10: 5 mg via ORAL

## 2022-12-10 MED ORDER — INSULIN ASPART 100 UNIT/ML IJ SOLN
0.0000 [IU] | Freq: Three times a day (TID) | INTRAMUSCULAR | Status: DC
  Administered 2022-12-10: 8 [IU] via SUBCUTANEOUS
  Administered 2022-12-11: 2 [IU] via SUBCUTANEOUS
  Administered 2022-12-11: 5 [IU] via SUBCUTANEOUS
  Administered 2022-12-11 – 2022-12-12 (×3): 3 [IU] via SUBCUTANEOUS
  Filled 2022-12-10 (×6): qty 1

## 2022-12-10 MED ORDER — KETAMINE HCL 50 MG/5ML IJ SOSY
PREFILLED_SYRINGE | INTRAMUSCULAR | Status: AC
  Filled 2022-12-10: qty 5

## 2022-12-10 MED ORDER — OXYCODONE HCL 5 MG PO TABS: 10.0000 mg | ORAL_TABLET | ORAL | Status: DC | PRN

## 2022-12-10 MED ORDER — MENTHOL 3 MG MT LOZG: 1.0000 | LOZENGE | OROMUCOSAL | Status: DC | PRN

## 2022-12-10 MED ORDER — GABAPENTIN 300 MG PO CAPS
300.0000 mg | ORAL_CAPSULE | Freq: Two times a day (BID) | ORAL | Status: DC
  Administered 2022-12-10 – 2022-12-12 (×4): 300 mg via ORAL
  Filled 2022-12-10 (×4): qty 1

## 2022-12-10 MED ORDER — SENNA 8.6 MG PO TABS
1.0000 | ORAL_TABLET | Freq: Two times a day (BID) | ORAL | Status: DC
  Administered 2022-12-10 – 2022-12-12 (×3): 8.6 mg via ORAL
  Filled 2022-12-10 (×4): qty 1

## 2022-12-10 MED ORDER — ACETAMINOPHEN 500 MG PO TABS
1000.0000 mg | ORAL_TABLET | Freq: Four times a day (QID) | ORAL | Status: AC
  Administered 2022-12-10 – 2022-12-11 (×2): 1000 mg via ORAL
  Filled 2022-12-10 (×2): qty 2

## 2022-12-10 MED ORDER — PHENYLEPHRINE HCL-NACL 20-0.9 MG/250ML-% IV SOLN
INTRAVENOUS | Status: DC | PRN
  Administered 2022-12-10: 30 ug/min via INTRAVENOUS

## 2022-12-10 MED ORDER — BISACODYL 10 MG RE SUPP: 10.0000 mg | Freq: Every day | RECTAL | Status: DC | PRN

## 2022-12-10 MED ORDER — DEXMEDETOMIDINE HCL IN NACL 80 MCG/20ML IV SOLN
INTRAVENOUS | Status: DC | PRN
  Administered 2022-12-10: 12 ug via BUCCAL
  Administered 2022-12-10: 8 ug via BUCCAL

## 2022-12-10 MED ORDER — SCOPOLAMINE 1 MG/3DAYS TD PT72
MEDICATED_PATCH | TRANSDERMAL | Status: DC | PRN
  Administered 2022-12-10: 1 via TRANSDERMAL

## 2022-12-10 MED ORDER — PHENOL 1.4 % MT LIQD: 1.0000 | OROMUCOSAL | Status: DC | PRN

## 2022-12-10 MED ORDER — LIDOCAINE HCL (CARDIAC) PF 100 MG/5ML IV SOSY
PREFILLED_SYRINGE | INTRAVENOUS | Status: DC | PRN
  Administered 2022-12-10: 80 mg via INTRAVENOUS

## 2022-12-10 MED ORDER — OXYCODONE HCL 5 MG/5ML PO SOLN: 5.0000 mg | Freq: Once | ORAL | Status: AC | PRN

## 2022-12-10 MED ORDER — DOCUSATE SODIUM 100 MG PO CAPS
100.0000 mg | ORAL_CAPSULE | Freq: Two times a day (BID) | ORAL | Status: DC
  Administered 2022-12-10 – 2022-12-12 (×3): 100 mg via ORAL
  Filled 2022-12-10 (×4): qty 1

## 2022-12-10 MED ORDER — PROPOFOL 500 MG/50ML IV EMUL
INTRAVENOUS | Status: DC | PRN
  Administered 2022-12-10: 150 ug/kg/min via INTRAVENOUS

## 2022-12-10 MED ORDER — HYDROMORPHONE HCL 1 MG/ML IJ SOLN
INTRAMUSCULAR | Status: AC
  Filled 2022-12-10: qty 1

## 2022-12-10 MED ORDER — SCOPOLAMINE 1 MG/3DAYS TD PT72
MEDICATED_PATCH | TRANSDERMAL | Status: AC
  Filled 2022-12-10: qty 1

## 2022-12-10 MED ORDER — METHOCARBAMOL 500 MG PO TABS
500.0000 mg | ORAL_TABLET | Freq: Four times a day (QID) | ORAL | Status: DC | PRN
  Administered 2022-12-12: 500 mg via ORAL
  Filled 2022-12-10: qty 1

## 2022-12-10 MED ORDER — ACETAMINOPHEN 10 MG/ML IV SOLN
INTRAVENOUS | Status: DC | PRN
  Administered 2022-12-10: 1000 mg via INTRAVENOUS

## 2022-12-10 MED ORDER — BUPIVACAINE HCL (PF) 0.5 % IJ SOLN
INTRAMUSCULAR | Status: AC
  Filled 2022-12-10: qty 60

## 2022-12-10 MED ORDER — CHLORHEXIDINE GLUCONATE 0.12 % MT SOLN
OROMUCOSAL | Status: AC
  Administered 2022-12-10: 15 mL via OROMUCOSAL
  Filled 2022-12-10: qty 15

## 2022-12-10 MED ORDER — PROPOFOL 10 MG/ML IV BOLUS
INTRAVENOUS | Status: AC
  Filled 2022-12-10: qty 20

## 2022-12-10 MED ORDER — SUCCINYLCHOLINE CHLORIDE 200 MG/10ML IV SOSY
PREFILLED_SYRINGE | INTRAVENOUS | Status: DC | PRN
  Administered 2022-12-10: 80 mg via INTRAVENOUS

## 2022-12-10 MED ORDER — KETOROLAC TROMETHAMINE 30 MG/ML IJ SOLN
INTRAMUSCULAR | Status: AC
  Filled 2022-12-10: qty 1

## 2022-12-10 MED ORDER — HYDROMORPHONE HCL 1 MG/ML IJ SOLN
0.2500 mg | INTRAMUSCULAR | Status: DC | PRN
  Administered 2022-12-10 (×2): 0.5 mg via INTRAVENOUS

## 2022-12-10 MED ORDER — VANCOMYCIN HCL IN DEXTROSE 1-5 GM/200ML-% IV SOLN
INTRAVENOUS | Status: AC
  Administered 2022-12-10: 1000 mg via INTRAVENOUS
  Filled 2022-12-10: qty 200

## 2022-12-10 MED ORDER — SODIUM CHLORIDE 0.9% FLUSH
3.0000 mL | Freq: Two times a day (BID) | INTRAVENOUS | Status: DC
  Administered 2022-12-10 – 2022-12-12 (×4): 3 mL via INTRAVENOUS

## 2022-12-10 MED ORDER — EPINEPHRINE PF 1 MG/ML IJ SOLN
INTRAMUSCULAR | Status: AC
  Filled 2022-12-10: qty 1

## 2022-12-10 MED ORDER — PROPOFOL 10 MG/ML IV BOLUS
INTRAVENOUS | Status: DC | PRN
  Administered 2022-12-10: 50 mg via INTRAVENOUS
  Administered 2022-12-10: 150 mg via INTRAVENOUS

## 2022-12-10 MED ORDER — SERTRALINE HCL 50 MG PO TABS
150.0000 mg | ORAL_TABLET | Freq: Every day | ORAL | Status: DC
  Administered 2022-12-10 – 2022-12-11 (×2): 150 mg via ORAL
  Filled 2022-12-10 (×3): qty 3

## 2022-12-10 MED ORDER — KETOROLAC TROMETHAMINE 15 MG/ML IJ SOLN
15.0000 mg | Freq: Four times a day (QID) | INTRAMUSCULAR | Status: AC
  Administered 2022-12-10 – 2022-12-11 (×4): 15 mg via INTRAVENOUS
  Filled 2022-12-10 (×4): qty 1

## 2022-12-10 SURGICAL SUPPLY — 91 items
ADH SKN CLS APL DERMABOND .7 (GAUZE/BANDAGES/DRESSINGS) ×3
AGENT HMST KT MTR STRL THRMB (HEMOSTASIS) ×1
ALLOGRAFT BONESTRIP KORE 2.5X5 (Bone Implant) IMPLANT
APL PRP STRL LF DISP 70% ISPRP (MISCELLANEOUS) ×2
BASIN KIT SINGLE STR (MISCELLANEOUS) ×1 IMPLANT
BUR NEURO DRILL SOFT 3.0X3.8M (BURR) IMPLANT
CHLORAPREP W/TINT 26 (MISCELLANEOUS) ×2 IMPLANT
CORD BIP STRL DISP 12FT (MISCELLANEOUS) ×1 IMPLANT
CORD LIGHT LATERIAL X LIFT (MISCELLANEOUS) IMPLANT
COUNTER NEEDLE 20/40 LG (NEEDLE) ×2 IMPLANT
COVER BACK TABLE REUSABLE LG (DRAPES) ×1 IMPLANT
COVERAGE SUPP BRAINLAB NG SPNE (MISCELLANEOUS) IMPLANT
COVERAGE SUPPORT SPINE BRAINLB (MISCELLANEOUS)
CUP MEDICINE 2OZ PLAST GRAD ST (MISCELLANEOUS) ×1 IMPLANT
DERMABOND ADVANCED .7 DNX12 (GAUZE/BANDAGES/DRESSINGS) ×3 IMPLANT
DRAPE 3D C-ARM OEC (DRAPES) IMPLANT
DRAPE C ARM PK CFD 31 SPINE (DRAPES) ×1 IMPLANT
DRAPE C-ARMOR (DRAPES) IMPLANT
DRAPE INCISE IOBAN 66X45 STRL (DRAPES) ×1 IMPLANT
DRAPE LAPAROTOMY 100X77 ABD (DRAPES) ×2 IMPLANT
DRAPE MICROSCOPE SPINE 48X150 (DRAPES) IMPLANT
DRAPE SCAN PATIENT (DRAPES) ×1 IMPLANT
DRSG OPSITE POSTOP 3X4 (GAUZE/BANDAGES/DRESSINGS) IMPLANT
DRSG OPSITE POSTOP 4X6 (GAUZE/BANDAGES/DRESSINGS) IMPLANT
DRSG TEGADERM 2-3/8X2-3/4 SM (GAUZE/BANDAGES/DRESSINGS) IMPLANT
DRSG TEGADERM 4X4.75 (GAUZE/BANDAGES/DRESSINGS) IMPLANT
DRSG TEGADERM 6X8 (GAUZE/BANDAGES/DRESSINGS) IMPLANT
ELECT CAUTERY BLADE TIP 2.5 (TIP) ×1
ELECT EZSTD 165MM 6.5IN (MISCELLANEOUS) ×1
ELECT REM PT RETURN 9FT ADLT (ELECTROSURGICAL) ×2
ELECTRODE CAUTERY BLDE TIP 2.5 (TIP) ×1 IMPLANT
ELECTRODE EZSTD 165MM 6.5IN (MISCELLANEOUS) ×1 IMPLANT
ELECTRODE REM PT RTRN 9FT ADLT (ELECTROSURGICAL) ×2 IMPLANT
EX-PIN ORTHOLOCK NAV 4X150 (PIN) IMPLANT
FEE CVG SUPP BRAINLAB NG SPNE (MISCELLANEOUS) IMPLANT
FEE INTRAOP CADWELL SUPPLY NCS (MISCELLANEOUS) ×1 IMPLANT
FEE INTRAOP MONITOR IMPULS NCS (MISCELLANEOUS) ×1 IMPLANT
FORCEPS BPLR BAYO 10IN 1.0TIP (ORTHOPEDIC DISPOSABLE SUPPLIES) IMPLANT
GAUZE 4X4 16PLY ~~LOC~~+RFID DBL (SPONGE) IMPLANT
GLOVE SURG SYN 6.5 ES PF (GLOVE) ×5 IMPLANT
GLOVE SURG SYN 6.5 PF PI (GLOVE) ×5 IMPLANT
GLOVE SURG SYN 8.5  E (GLOVE) ×7
GLOVE SURG SYN 8.5 E (GLOVE) ×7 IMPLANT
GLOVE SURG SYN 8.5 PF PI (GLOVE) ×6 IMPLANT
GLOVE SURG UNDER POLY LF SZ6.5 (GLOVE) ×3 IMPLANT
GOWN SRG LRG LVL 4 IMPRV REINF (GOWNS) ×2 IMPLANT
GOWN SRG XL LVL 3 NONREINFORCE (GOWNS) ×2 IMPLANT
GOWN STRL NON-REIN TWL XL LVL3 (GOWNS) ×2
GOWN STRL REIN LRG LVL4 (GOWNS) ×2
GRAFT DURAGEN MATRIX 1WX1L (Tissue) IMPLANT
HOLDER FOLEY CATH W/STRAP (MISCELLANEOUS) ×1 IMPLANT
INTRAOP CADWELL SUPPLY FEE NCS (MISCELLANEOUS)
INTRAOP DISP SUPPLY FEE NCS (MISCELLANEOUS)
INTRAOP MONITOR FEE IMPULS NCS (MISCELLANEOUS)
INTRAOP MONITOR FEE IMPULSE (MISCELLANEOUS)
KIT DILATOR XLIF 5 (KITS) IMPLANT
KIT DISP MARS 3V (KITS) IMPLANT
KIT NDL NVM5 EMG ELECT (KITS) IMPLANT
KIT NEEDLE NVM5 EMG ELECT (KITS) ×1 IMPLANT
KIT SPINAL PRONEVIEW (KITS) ×1 IMPLANT
KIT TURNOVER KIT A (KITS) ×1 IMPLANT
KNIFE BAYONET SHORT DISCETOMY (MISCELLANEOUS) IMPLANT
MANIFOLD NEPTUNE II (INSTRUMENTS) ×2 IMPLANT
MARKER SKIN DUAL TIP RULER LAB (MISCELLANEOUS) ×2 IMPLANT
MARKER SPHERE PSV REFLC 13MM (MARKER) ×7 IMPLANT
NDL SAFETY ECLIP 18X1.5 (MISCELLANEOUS) ×1 IMPLANT
PACK LAMINECTOMY NEURO (CUSTOM PROCEDURE TRAY) ×1 IMPLANT
PAD ARMBOARD 7.5X6 YLW CONV (MISCELLANEOUS) ×1 IMPLANT
PIN NAVIGATION VECTOR 50 (PIN) IMPLANT
RELINE ONE VECTORS, 50 MM IMPLANT
ROD RELINE MAS TI LORD 5.5X40 (Rod) IMPLANT
SCREW LOCK RELINE 5.5 TULIP (Screw) IMPLANT
SCREW RELINE RED 6.5X45MM POLY (Screw) IMPLANT
SOLUTION IRRIG SURGIPHOR (IV SOLUTION) ×1 IMPLANT
SPACER HEDRON 18X40X11 10D (Spacer) IMPLANT
SPONGE GAUZE 2X2 8PLY STRL LF (GAUZE/BANDAGES/DRESSINGS) IMPLANT
STAPLER SKIN PROX 35W (STAPLE) IMPLANT
SURGIFLO W/THROMBIN 8M KIT (HEMOSTASIS) ×1 IMPLANT
SUT DVC VLOC 3-0 CL 6 P-12 (SUTURE) ×1 IMPLANT
SUT ETHILON 3-0 FS-10 30 BLK (SUTURE) ×1
SUT VIC AB 0 CT1 27 (SUTURE) ×2
SUT VIC AB 0 CT1 27XCR 8 STRN (SUTURE) ×1 IMPLANT
SUT VIC AB 2-0 CT1 18 (SUTURE) ×1 IMPLANT
SUTURE EHLN 3-0 FS-10 30 BLK (SUTURE) IMPLANT
SYR 30ML LL (SYRINGE) ×2 IMPLANT
TOWEL OR 17X26 4PK STRL BLUE (TOWEL DISPOSABLE) ×2 IMPLANT
TRAP FLUID SMOKE EVACUATOR (MISCELLANEOUS) ×1 IMPLANT
TRAY FOLEY MTR SLVR 16FR STAT (SET/KITS/TRAYS/PACK) IMPLANT
TROCAR INSERT W/PEDICLE NDL (TROCAR) IMPLANT
WATER STERILE IRR 1000ML POUR (IV SOLUTION) ×2 IMPLANT
WATER STERILE IRR 500ML POUR (IV SOLUTION) IMPLANT

## 2022-12-10 NOTE — Transfer of Care (Signed)
Immediate Anesthesia Transfer of Care Note  Patient: Andrea Mcdonald  Procedure(s) Performed: L4-5 LATERAL LUMBAR INTERBODY FUSION WITH POSTERIOR SPINAL FUSION (NUVASIVE) (Spine Lumbar) APPLICATION OF INTRAOPERATIVE CT SCAN (Spine Lumbar)  Patient Location: PACU  Anesthesia Type:General  Level of Consciousness: drowsy  Airway & Oxygen Therapy: Patient Spontanous Breathing and Patient connected to face mask oxygen  Post-op Assessment: Report given to RN, Post -op Vital signs reviewed and stable, and Patient moving all extremities  Post vital signs: Reviewed and stable  Last Vitals:  Vitals Value Taken Time  BP 108/59 12/10/22 1035  Temp    Pulse 79 12/10/22 1040  Resp 13 12/10/22 1040  SpO2 100 % 12/10/22 1040  Vitals shown include unvalidated device data.  Last Pain:  Vitals:   12/10/22 0619  TempSrc: Temporal  PainSc: 0-No pain         Complications: No notable events documented.

## 2022-12-10 NOTE — Op Note (Signed)
Indications: Ms. 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   Preoperative Note:   Risks of surgery discussed include: infection, bleeding, stroke, coma, death, paralysis, CSF leak, nerve/spinal cord injury, numbness, tingling, weakness, complex regional pain syndrome, recurrent stenosis and/or disc herniation, vascular injury, development of instability, neck/back pain, need for further surgery, persistent symptoms, development of deformity, and the risks of anesthesia. The patient understood these risks and agreed to proceed.  NAME OF ANTERIOR PROCEDURE:               1. Anterior lumbar interbody fusion via a right lateral retroperitoneal approach at L4/5 2. Placement of a Lordotic  Globus Hedron interbody cage, filled with Demineralized Bone Matrix  NAME OF POSTERIOR PROCEDURE 1. Posterior instrumentation using Globus Creo Instrumentation 2. Posterolateral fusion, L4/5, utilizing demineralized bone matrix 3. Use of Stereotaxis   PROCEDURE:  Patient was brought to the operating room, intubated, turned to the lateral position.  All pressure points were checked and double-checked.  The patient was prepped and draped in the standard fashion. Prior to prepping,  fluoroscopy was brought in and the patient was positioned with a large bump under the contralateral side between the iliac crest and rib cage, allowing the area between the iliac crest and the lateral aspect of the rib cage to open and increase the ability to reach inferiorly, to facilitate entry into the disc space.  The incision was marked upon the skin both the location of the disc space as well as the superior most aspect of the iliac crest.  Based on the identification of the disc space an incision was prepared, marked upon the skin and eventually was used for our lateral incision.  The fluoroscopy was turned into a cross table A/P image in order to confirm that the patient's spine remained in a perpendicular trajectory to the floor without rotation.  Once confirming that all the pressure points were checked and double-checked and the patient remained in sturdy position strapped down in this slightly jack-knifed lateral position, the patient was prepped and draped in standard fashion.  The skin was injected with local anesthetic, then incised until the abdominal wall fascia was noted.  I bluntly dissected posteriorly until we were able to identify the posterior musculature near petit's triangle.  At this point, using primarily blunt dissection with our finger aided with a metzenbaum scissor, were able to enter the retroperitoneal cavity.  The retroperitoneal potential space was opened further until palpating out the psoas muscle, the medial aspect of the iliac crest, the medial aspect of the last rib and continued to define the retroperitoneal space with blunt dissection in order to facilitate safe placement of our dilators.    While protecting by dissecting directly onto a finger in the retroperitoneum, the retroperitoneal space was entered safely from the lateral incision and the initial dilator placed onto the muscle belly of the psoas.  While directly stimulating  the dilator and after radiographically  confirming our location relative to the disc space, I placed the dilator through the psoas.  The dilators were stimulated to ensure remaining safely away from any of the lumbar plexus nerves; the dilators were repositioned until no pathologic stimulation was appreciated.  Once I had confirmed the location of our initial dilator radiographically, a K-wire secured the dilator into the L4/5 disc space and confirmed position under A/P and lateral fluoroscopy.  At this point, I dilated up with direct stimulation to confirm lack of pathologic stimulation.  Once all the dilators were in position, I placed in the retractor and secured it onto the table, locked into position and confirmed under A/P and lateral fluoroscopy to confirm our approach angle to the disc space as well as location relative to the disc space.  I then placed the muscle stimulator in through the working channel down to the vertebral body, stimulating the entire lateral surface of the vertebral body and any of the visualized psoas muscle that was adjacent to the retractor, confirming again the safe passage to the psoas before we began performing the discectomy.  At this point, we began our discectomy at L4/5.  The disc was incised laterally throughout the extent of our exposure. Using a combination of pituitary rongeurs, Kerrison rongeurs, rasps, curettes of various sorts, we were able to begin to clean out the disc space.  Once we had cleaned out the majority of the disc space, we then cut the lateral annulus with a cob, breaking the lateral annual attachments on the contralateral side by subtly working the cob through the annulus while using flouroscopy.  Care was taken not to extend further than required after cutting the annular attachments.  After this had been performed, we prepared the endplates for placement of our graft, sized a graft to the disc space by serially dilating up in trial sizes until we confirmed that our graft would be well  positioned, allowing distraction while maintaining good grip.  This was confirmed under A/P and lateral fluoroscopy in order to ensure its placement as an eventual trial for placement of our final graft.  We irrigated with bacteriostatic saline.  Once confirmed placement, the Hedron implant filled with allograft was impacted into position at L4/5.   Through a combination of intradiscal distraction and anterior releasing, we were able to correct the anterior deformity during disc preparation and placement of the graft.  At this point, final radiographs were performed, and we began closure.  The wound was closed using 0 Vicryl interrupted suture in the fascia and 2-0 Vicryl inverted suture were placed in the subcutaneous tissue and dermis. 3-0 monocryl was used for final closure. Dermabond was used to close the skin.    After closing the anterior part in layers, the patient was repositioned into prone position.  Extreme care was taken around the patient's right arm, which has significant limitation in R shoulder mobility. All pressure points were checked and double-checked.  The posterior operative site was prepped and draped in standard fashion.  The stereotactic array was placed.  Stereotactic images were acquired using intraoperative CT scanning.  This was registered to the patient.  Using stereotaxis, screw trajectories were planned and incisions made.  The pedicles from L4 to L5 were cannulated bilaterally and K wires used to secure the tracks.  We then utilized a stereotactic screwdriver to place pedicle screws from L4 to L5.  At each level, 6.5x35m Nuvasive Reline pedicle screws were placed.  Once  the screws were placed, the screw extensions were then linked, a path was formed for the rod and a rod was utilized to connect the screws.  We then compressed, torqued / counter-torqued and removed the screw assembly. Once performed on each side, the C-arm was brought back and to take confirmatory CT  scan showing appropriate placement of all instrumentation and anatomic alignment.    Posterolateral arthrodesis was performed at L4-L5 utilizing demineralized bone matrix.  Again we confirmed radiographically and began our closure.  The wound was closed using 0 Vicryl interrupted suture in the fascia, 2-0 Vicryl inverted suture were placed in the subcutaneous tissue and dermis. 3-0 monocryl was used for final closure. Dermabond was used to close the skin.    Needle, lap and all counts were correct at the end of the case.    There was no pathologic change in the neuromonitoring during the procedure.   Cooper Render PA assisted in the entire procedure. An assistant was required for this procedure due to the complexity.  The assistant provided assistance in tissue manipulation and suction, and was required for the successful and safe performance of the procedure. I performed the critical portions of the procedure.   Meade Maw MD Neurosurgery

## 2022-12-10 NOTE — Anesthesia Postprocedure Evaluation (Signed)
Anesthesia Post Note  Patient: Andrea Mcdonald  Procedure(s) Performed: L4-5 LATERAL LUMBAR INTERBODY FUSION WITH POSTERIOR SPINAL FUSION (NUVASIVE) (Spine Lumbar) APPLICATION OF INTRAOPERATIVE CT SCAN (Spine Lumbar)  Patient location during evaluation: PACU Anesthesia Type: General Level of consciousness: awake and alert Pain management: pain level controlled Vital Signs Assessment: post-procedure vital signs reviewed and stable Respiratory status: spontaneous breathing, nonlabored ventilation, respiratory function stable and patient connected to nasal cannula oxygen Cardiovascular status: blood pressure returned to baseline and stable Postop Assessment: no apparent nausea or vomiting Anesthetic complications: no   No notable events documented.   Last Vitals:  Vitals:   12/10/22 1100 12/10/22 1115  BP: 133/80 105/72  Pulse: 89 87  Resp: (!) 22 10  Temp: (!) 36.3 C   SpO2: 100% 95%    Last Pain:  Vitals:   12/10/22 1115  TempSrc:   PainSc: 8                  Ilene Qua

## 2022-12-10 NOTE — Interval H&P Note (Signed)
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  The various methods of treatment have been discussed with the patient and family. After consideration of risks, benefits and other options for treatment, the patient has consented to  Procedure(s): L4-5 LATERAL LUMBAR INTERBODY FUSION WITH POSTERIOR SPINAL FUSION (NUVASIVE) (N/A) APPLICATION OF INTRAOPERATIVE CT SCAN (N/A) as a surgical intervention.  The patient's history has been reviewed, patient examined, no change in status, stable for surgery.  I have reviewed the patient's chart and labs.  Questions were answered to the patient's satisfaction.    Heart sounds normal no MRG. Chest Clear to Auscultation Bilaterally.   Andrea Mcdonald

## 2022-12-10 NOTE — Plan of Care (Signed)
  Problem: Education: Goal: Ability to verbalize activity precautions or restrictions will improve Outcome: Progressing Goal: Knowledge of the prescribed therapeutic regimen will improve Outcome: Progressing Goal: Understanding of discharge needs will improve Outcome: Progressing   Problem: Activity: Goal: Ability to avoid complications of mobility impairment will improve Outcome: Progressing Goal: Ability to tolerate increased activity will improve Outcome: Progressing Goal: Will remain free from falls Outcome: Progressing   Problem: Bowel/Gastric: Goal: Gastrointestinal status for postoperative course will improve Outcome: Progressing   Problem: Clinical Measurements: Goal: Ability to maintain clinical measurements within normal limits will improve Outcome: Progressing Goal: Postoperative complications will be avoided or minimized Outcome: Progressing Goal: Diagnostic test results will improve Outcome: Progressing   Problem: Pain Management: Goal: Pain level will decrease Outcome: Progressing   Problem: Skin Integrity: Goal: Will show signs of wound healing Outcome: Progressing   Problem: Health Behavior/Discharge Planning: Goal: Identification of resources available to assist in meeting health care needs will improve Outcome: Progressing   Problem: Bladder/Genitourinary: Goal: Urinary functional status for postoperative course will improve Outcome: Progressing   Problem: Education: Goal: Ability to describe self-care measures that may prevent or decrease complications (Diabetes Survival Skills Education) will improve Outcome: Progressing Goal: Individualized Educational Video(s) Outcome: Progressing   Problem: Coping: Goal: Ability to adjust to condition or change in health will improve Outcome: Progressing   Problem: Fluid Volume: Goal: Ability to maintain a balanced intake and output will improve Outcome: Progressing   Problem: Health Behavior/Discharge  Planning: Goal: Ability to identify and utilize available resources and services will improve Outcome: Progressing Goal: Ability to manage health-related needs will improve Outcome: Progressing   Problem: Metabolic: Goal: Ability to maintain appropriate glucose levels will improve Outcome: Progressing   Problem: Nutritional: Goal: Maintenance of adequate nutrition will improve Outcome: Progressing Goal: Progress toward achieving an optimal weight will improve Outcome: Progressing   Problem: Skin Integrity: Goal: Risk for impaired skin integrity will decrease Outcome: Progressing   Problem: Tissue Perfusion: Goal: Adequacy of tissue perfusion will improve Outcome: Progressing   Problem: Education: Goal: Knowledge of General Education information will improve Description: Including pain rating scale, medication(s)/side effects and non-pharmacologic comfort measures Outcome: Progressing   Problem: Health Behavior/Discharge Planning: Goal: Ability to manage health-related needs will improve Outcome: Progressing   Problem: Clinical Measurements: Goal: Ability to maintain clinical measurements within normal limits will improve Outcome: Progressing Goal: Will remain free from infection Outcome: Progressing Goal: Diagnostic test results will improve Outcome: Progressing Goal: Respiratory complications will improve Outcome: Progressing Goal: Cardiovascular complication will be avoided Outcome: Progressing   Problem: Activity: Goal: Risk for activity intolerance will decrease Outcome: Progressing   Problem: Nutrition: Goal: Adequate nutrition will be maintained Outcome: Progressing   Problem: Coping: Goal: Level of anxiety will decrease Outcome: Progressing   Problem: Elimination: Goal: Will not experience complications related to bowel motility Outcome: Progressing Goal: Will not experience complications related to urinary retention Outcome: Progressing    Problem: Pain Managment: Goal: General experience of comfort will improve Outcome: Progressing   Problem: Safety: Goal: Ability to remain free from injury will improve Outcome: Progressing   Problem: Skin Integrity: Goal: Risk for impaired skin integrity will decrease Outcome: Progressing

## 2022-12-10 NOTE — Anesthesia Procedure Notes (Signed)
Procedure Name: Intubation Date/Time: 12/10/2022 7:25 AM  Performed by: Esaw Grandchild, CRNAPre-anesthesia Checklist: Patient identified, Emergency Drugs available, Suction available and Patient being monitored Patient Re-evaluated:Patient Re-evaluated prior to induction Oxygen Delivery Method: Circle system utilized Preoxygenation: Pre-oxygenation with 100% oxygen Induction Type: IV induction Ventilation: Mask ventilation without difficulty Laryngoscope Size: Miller and 2 Grade View: Grade I Tube type: Oral Tube size: 7.0 mm Number of attempts: 1 Airway Equipment and Method: Stylet, Oral airway and Bite block Placement Confirmation: ETT inserted through vocal cords under direct vision, positive ETCO2 and breath sounds checked- equal and bilateral Secured at: 21 cm Tube secured with: Tape Dental Injury: Teeth and Oropharynx as per pre-operative assessment

## 2022-12-10 NOTE — Anesthesia Preprocedure Evaluation (Signed)
Anesthesia Evaluation  Patient identified by MRN, date of birth, ID band Patient awake    Reviewed: Allergy & Precautions, NPO status , Patient's Chart, lab work & pertinent test results  History of Anesthesia Complications (+) PONV and history of anesthetic complications  Airway Mallampati: II  TM Distance: >3 FB Neck ROM: full    Dental no notable dental hx.    Pulmonary neg pulmonary ROS   Pulmonary exam normal        Cardiovascular negative cardio ROS Normal cardiovascular exam     Neuro/Psych  PSYCHIATRIC DISORDERS Anxiety Depression     Neuromuscular disease    GI/Hepatic negative GI ROS, Neg liver ROS,,,  Endo/Other  negative endocrine ROSdiabetes, Well Controlled    Renal/GU      Musculoskeletal   Abdominal   Peds  Hematology negative hematology ROS (+)   Anesthesia Other Findings Past Medical History: No date: Anxiety No date: Arthritis No date: Chronic venous insufficiency No date: Depression No date: High cholesterol No date: History of kidney stones No date: Hypertension 05/25/2013: Plantar fasciitis, bilateral No date: PONV (postoperative nausea and vomiting)     Comment:  n/v x1 after 09-2022 shoulder surgery No date: RLS (restless legs syndrome) No date: Type 2 diabetes mellitus ([place]) No date: Vitamin D deficiency  Past Surgical History: No date: AUGMENTATION MAMMAPLASTY 10/16/2022: BICEPT TENODESIS; Right     Comment:  Procedure: BICEPS TENOTOMY;  Surgeon: Thornton Park,               MD;  Location: [facility] ORS;  Service: Orthopedics;                Laterality: Right; 09/14/2020: COLONOSCOPY WITH PROPOFOL; N/A     Comment:  Procedure: COLONOSCOPY WITH PROPOFOL;  Surgeon: Jonathon Bellows, MD;  Location: [facility] ENDOSCOPY;  Service:               Gastroenterology;  Laterality: N/A; No date: CYSTOSCOPY 11/26/2021: CYSTOSCOPY W/ RETROGRADES; Bilateral     Comment:  Procedure:  CYSTOSCOPY WITH RETROGRADE PYELOGRAM;                Surgeon: Abbie Sons, MD;  Location: [facility] ORS;                Service: Urology;  Laterality: Bilateral; 2008: ESOPHAGOGASTRIC FUNDOPLICATION     Comment:  at [facility], for GERD No date: KIDNEY STONE SURGERY No date: LITHOTRIPSY No date: NECK SURGERY     Comment:  x2 No date: PARTIAL HYSTERECTOMY No date: PLACEMENT OF BREAST IMPLANTS 03/2021: right hand trigger release 10/16/2022: SHOULDER ARTHROSCOPY WITH OPEN ROTATOR CUFF REPAIR AND  DISTAL CLAVICLE ACROMINECTOMY; Right     Comment:  Procedure: SHOULDER ARTHROSCOPY WITH OPEN ROTATOR CUFF               REPAIR AND DISTAL CLAVICLE EXCISION, SUBACROMIAL               DECOMPRESSION;  Surgeon: Thornton Park, MD;  Location:              [facility] ORS;  Service: Orthopedics;  Laterality: Right;  BMI    Body Mass Index: 28.91 kg/m      Reproductive/Obstetrics negative OB ROS                             Anesthesia Physical Anesthesia Plan  ASA: 2  Anesthesia Plan: General ETT   Post-op Pain Management: Ofirmev IV (intra-op)*, Toradol IV (intra-op)*, Dilaudid IV, Ketamine IV* and Precedex   Induction: Intravenous  PONV Risk Score and Plan: Ondansetron, Dexamethasone, Midazolam, Treatment may vary due to age or medical condition and Scopolamine patch - Pre-op  Airway Management Planned: Oral ETT  Additional Equipment:   Intra-op Plan:   Post-operative Plan: Extubation in OR  Informed Consent: I have reviewed the patients History and Physical, chart, labs and discussed the procedure including the risks, benefits and alternatives for the proposed anesthesia with the patient or authorized representative who has indicated his/her understanding and acceptance.     Dental Advisory Given  Plan Discussed with: Anesthesiologist, CRNA and Surgeon  Anesthesia Plan Comments: (Patient consented for risks of anesthesia including but not limited to:  - adverse  reactions to medications - damage to eyes, teeth, lips or other oral mucosa - nerve damage due to positioning  - sore throat or hoarseness - Damage to heart, brain, nerves, lungs, other parts of body or loss of life  Patient voiced understanding.)       Anesthesia Quick Evaluation

## 2022-12-10 NOTE — Plan of Care (Signed)
  Problem: Pain Management: Goal: Pain level will decrease Outcome: Progressing   Problem: Pain Managment: Goal: General experience of comfort will improve Outcome: Progressing   Problem: Safety: Goal: Ability to remain free from injury will improve Outcome: Progressing   Problem: Skin Integrity: Goal: Risk for impaired skin integrity will decrease Outcome: Progressing

## 2022-12-11 ENCOUNTER — Encounter: Payer: Self-pay | Admitting: Neurosurgery

## 2022-12-11 LAB — GLUCOSE, CAPILLARY
Glucose-Capillary: 129 mg/dL — ABNORMAL HIGH (ref 70–99)
Glucose-Capillary: 172 mg/dL — ABNORMAL HIGH (ref 70–99)
Glucose-Capillary: 231 mg/dL — ABNORMAL HIGH (ref 70–99)
Glucose-Capillary: 177 mg/dL — ABNORMAL HIGH (ref 70–99)
Glucose-Capillary: 152 mg/dL — ABNORMAL HIGH (ref 70–99)

## 2022-12-11 NOTE — Progress Notes (Signed)
Patient is not able to walk the distance required to go the bathroom, or he/she is unable to safely negotiate stairs required to access the bathroom.  A 3in1 BSC will alleviate this problem  

## 2022-12-11 NOTE — TOC Progression Note (Signed)
Transition of Care [facility]) - Progression Note    Patient Details  Name: Andrea Mcdonald MRN: [id number] Date of Birth: 12/02/1960  Transition of Care [facility]) CM/SW [place], RN Phone Number: 12/11/2022, 9:47 AM  Clinical Narrative:     The patient will be assessed by PT and OT, anticipate the plan to be go home with [facility], I reached out to meg with [employer] to inquire if they were set up prior to surgery by surgeons office, awaiting a response TOC to follow and assist with DC planning and needs  Expected Discharge Plan: [place] Barriers to Discharge: Continued Medical Work up  Expected Discharge Plan and Services   Discharge Planning Services: CM Consult   Living arrangements for the past 2 months: Single Family Home                                       Social Determinants of Health (SDOH) Interventions SDOH Screenings   Food Insecurity: No Food Insecurity (12/10/2022)  Housing: Low Risk  (12/10/2022)  Transportation Needs: No Transportation Needs (12/10/2022)  Utilities: Not At Risk (12/10/2022)  Depression (PHQ2-9): Medium Risk (11/27/2022)  Tobacco Use: Low Risk  (12/10/2022)    Readmission Risk Interventions     No data to display

## 2022-12-11 NOTE — Evaluation (Signed)
Occupational Therapy Evaluation Patient Details Name: Andrea Mcdonald MRN: [id number] DOB: [birth year] Today's Date: 12/11/2022   History of Present Illness admitted for acute hospitalization s/p XLIF due to spondylolisthesis, neurogenic claudication (12/10/22)   Clinical Impression   Pt is greeted in room, agreeable to OT evaluation. Pt is alert and oriented x4, good awareness of current level of deficits/function. PTA pt is generally MOD IN in ADL/IADL, assist as needed due to recent R shoulder surgery in 09/2022. Pt provided education re: precautions, self care skills, AE, and home/routines modifications to maximize safety and functional independence while minimizing falls risk and maintaining precautions. Pt verbalized understanding of all education/training provided. Able to return demonstration safe techniques while maintaining precautions throughout with supervision and intermittent-frequent vcs. At this time recommend HHOT to address functional deficits. OT will continue to follow acutely.    Recommendations for follow up therapy are one component of a multi-disciplinary discharge planning process, led by the attending physician.  Recommendations may be updated based on patient status, additional functional criteria and insurance authorization.   Follow Up Recommendations  Home health OT     Assistance Recommended at Discharge Intermittent Supervision/Assistance  Patient can return home with the following A little help with walking and/or transfers;A little help with bathing/dressing/bathroom;Assistance with cooking/housework;Help with stairs or ramp for entrance;Assist for transportation    Functional Status Assessment  Patient has had a recent decline in their functional status and demonstrates the ability to make significant improvements in function in a reasonable and predictable amount of time.  Equipment Recommendations  BSC/3in1    Recommendations for Other Services        Precautions / Restrictions Precautions Precautions: Fall Restrictions Weight Bearing Restrictions: No      Mobility Bed Mobility               General bed mobility comments: NT in recliner pre/post session; reviewed body mechanics for safe bed mobility    Transfers Overall transfer level: Needs assistance Equipment used: Rolling walker (2 wheels) Transfers: Sit to/from Stand Sit to Stand: Supervision                  Balance Overall balance assessment: Needs assistance Sitting-balance support: No upper extremity supported, Feet supported Sitting balance-Leahy Scale: Good     Standing balance support: Bilateral upper extremity supported, During functional activity Standing balance-Leahy Scale: Fair                             ADL either performed or assessed with clinical judgement   ADL Overall ADL's : Needs assistance/impaired Eating/Feeding: Set up;Sitting   Grooming: Wash/dry hands;Standing;Supervision/safety Grooming Details (indicate cue type and reason): at sink level with RW; frequent vcs for RW use         Upper Body Dressing : Supervision/safety;Sitting Upper Body Dressing Details (indicate cue type and reason): anticipated Lower Body Dressing: Minimal assistance Lower Body Dressing Details (indicate cue type and reason): socks, vcs for figure 4 technique Toilet Transfer: Supervision/safety;BSC/3in1;Rolling walker (2 wheels) Toilet Transfer Details (indicate cue type and reason): intermittent vcs for technique Toileting- Clothing Manipulation and Hygiene: Supervision/safety;Sit to/from stand       Functional mobility during ADLs: Supervision/safety;Min guard;Rolling walker (2 wheels) (approx 20')       Vision Patient Visual Report: No change from baseline       Perception     Praxis      Pertinent Vitals/Pain Pain Assessment Pain Assessment: 0-10  Pain Score: 6  Pain Location: Back Pain Descriptors / Indicators:  Aching, Guarding, Grimacing Pain Intervention(s): Limited activity within patient's tolerance, Monitored during session, Repositioned     Hand Dominance     Extremity/Trunk Assessment Upper Extremity Assessment Upper Extremity Assessment: RUE deficits/detail;Overall [facility] for tasks assessed RUE Deficits / Details: per pt restrictions in shoulder flexion/ internal/external rotation therefore NT; hand/wrist appears WFL when assessed functionally   Lower Extremity Assessment Lower Extremity Assessment: Overall WFL for tasks assessed       Communication Communication Communication: No difficulties   Cognition Arousal/Alertness: Awake/alert Behavior During Therapy: WFL for tasks assessed/performed Overall Cognitive Status: Within Functional Limits for tasks assessed                                       General Comments  vital signs appear stable throughout, pt reports she is cold and gets shaky when she is cold when pt is noted to be shaking at start of session, provided blankets with resolution of symptoms    Exercises     Shoulder Instructions      Home Living Family/patient expects to be discharged to:: Private residence Living Arrangements: Spouse/significant other Available Help at Discharge: Family Type of Home: House Home Access: Stairs to enter Technical brewer of Steps: 3 Entrance Stairs-Rails: None Home Layout: One level     Bathroom Shower/Tub: Occupational psychologist: Standard (but has toilet riser)     Home Equipment: Conservation officer, nature (2 wheels);Rollator (4 wheels);Toilet riser;Shower seat          Prior Functioning/Environment Prior Level of Function : Independent/Modified Independent             Mobility Comments: amb with no AD ADLs Comments: recent rotator cuff/biceps surgery in December 2023 limiting RUE function- performing ADLs with MOD I or assist as needed; assist with IADLs a needed however generally MOD I.  Works as a Secretary/administrator at [employer]        OT Problem List: Decreased activity tolerance;Impaired balance (sitting and/or standing);Decreased knowledge of use of DME or AE      OT Treatment/Interventions: Self-care/ADL training;Patient/family education;Therapeutic exercise;Balance training;Energy conservation;Therapeutic activities;DME and/or AE instruction    OT Goals(Current goals can be found in the care plan section) Acute Rehab OT Goals Patient Stated Goal: get stronger OT Goal Formulation: With patient Time For Goal Achievement: 12/25/22 Potential to Achieve Goals: Good ADL Goals Pt Will Perform Grooming: with modified independence;standing;sitting Pt Will Perform Lower Body Dressing: with modified independence Pt Will Transfer to Toilet: with modified independence Pt Will Perform Toileting - Clothing Manipulation and hygiene: with modified independence;sit to/from stand  OT Frequency: Min 2X/week    Co-evaluation              AM-PAC OT "6 Clicks" Daily Activity     Outcome Measure Help from another person eating meals?: None Help from another person taking care of personal grooming?: None Help from another person toileting, which includes using toliet, bedpan, or urinal?: None Help from another person bathing (including washing, rinsing, drying)?: A Little Help from another person to put on and taking off regular upper body clothing?: None Help from another person to put on and taking off regular lower body clothing?: A Little 6 Click Score: 22   End of Session Equipment Utilized During Treatment: Rolling walker (2 wheels) Nurse Communication: Mobility status  Activity Tolerance:  Patient tolerated treatment well Patient left: in chair;with call bell/phone within reach;with chair alarm set  OT Visit Diagnosis: Unsteadiness on feet (R26.81);Muscle weakness (generalized) (M62.81)                Time: 1042-1100 OT Time Calculation (min): 18 min Charges:   OT General Charges $OT Visit: 1 Visit OT Evaluation $OT Eval Low Complexity: 1 Low  Shanon Payor, OTD OTR/L  12/11/22, 11:15 AM

## 2022-12-11 NOTE — Progress Notes (Signed)
    Attending Progress Note  History: Andrea Mcdonald is s/p L4-5 XLIF for spondylolisthesis and neurogenic claudication.  POD1: Pt denies any concerns this morning. She has not yet ambulated but denies any leg pain  Physical Exam: Vitals:   12/11/22 0418 12/11/22 0743  BP: 119/64 118/63  Pulse: 66 66  Resp: 18 18  Temp: 98.2 F (36.8 C) 97.8 F (36.6 C)  SpO2: 100% 98%    AA Ox3 CNI  Strength:5/5 throughout BLE  Incisions covered with post-op bandages   Data:  Other tests/results: none   Assessment/Plan:  Andrea Mcdonald is a pleasant 62 y.o s/p L4-5 XLIF for spondylolisthesis and neurogenic claudication  - mobilize - pain control - DVT prophylaxis - PTOT  Cooper Render PA-C Department of Neurosurgery

## 2022-12-11 NOTE — Evaluation (Signed)
Physical Therapy Evaluation Patient Details Name: Andrea Mcdonald MRN: [id number] DOB: [birth year] Today's Date: 12/11/2022  History of Present Illness  admitted for acute hospitalization s/p XLIF due to spondylolisthesis, neurogenic claudication (12/10/22)  Clinical Impression  Patient standing at sink upon arrival to room (CNA at bedside); alert and oriented, follows commands and eager for ambulation trials.  Endorses back pain 6-7/10; meds received prior to session.  Bilat UE/LE strength and ROM grossly symmetrical and WFL; no focal weakness, paresthesia reported.  Able to complete sit/stand, basic transfers and gait (140') with RW, cga/close sup.  Demonstrates reciprocal stepping pattern with fair step height/length; limited heel strike/toe off; slow and cautious gait performance, but no overt buckling or LOB Would benefit from skilled PT to address above deficits and promote optimal return to PLOF.; Recommend transition to HHPT upon discharge from acute hospitalization.    Recommendations for follow up therapy are one component of a multi-disciplinary discharge planning process, led by the attending physician.  Recommendations may be updated based on patient status, additional functional criteria and insurance authorization.  Follow Up Recommendations Home health PT      Assistance Recommended at Discharge PRN  Patient can return home with the following  A little help with walking and/or transfers;A little help with bathing/dressing/bathroom    Equipment Recommendations    Recommendations for Other Services       Functional Status Assessment Patient has had a recent decline in their functional status and demonstrates the ability to make significant improvements in function in a reasonable and predictable amount of time.     Precautions / Restrictions Precautions Precautions: Fall Restrictions Weight Bearing Restrictions: No      Mobility  Bed Mobility                General bed mobility comments: standing at sink upon arrival to room; seated in recliner end of session    Transfers Overall transfer level: Needs assistance Equipment used: Rolling walker (2 wheels) Transfers: Sit to/from Stand Sit to Stand: Supervision           General transfer comment: sit/stand from recliner, edge of bed with and without assist device, cga/close sup    Ambulation/Gait Ambulation/Gait assistance: Supervision, Min guard Gait Distance (Feet): 140 Feet Assistive device: Rolling walker (2 wheels)         General Gait Details: reciprocal stepping pattern with fair step height/length; limited heel strike/toe off; slow and cautious gait performance, but no overt buckling or LOB  Stairs            Wheelchair Mobility    Modified Rankin (Stroke Patients Only)       Balance Overall balance assessment: Needs assistance Sitting-balance support: No upper extremity supported, Feet supported Sitting balance-Leahy Scale: Good     Standing balance support: Bilateral upper extremity supported Standing balance-Leahy Scale: Fair                               Pertinent Vitals/Pain Pain Assessment Pain Assessment: 0-10 Pain Score: 6  Pain Location: Back Pain Descriptors / Indicators: Aching, Guarding, Grimacing Pain Intervention(s): Limited activity within patient's tolerance, Repositioned, Monitored during session    [place] expects to be discharged to:: Private residence Living Arrangements: Spouse/significant other Available Help at Discharge: Family Type of Home: House Home Access: Stairs to enter Entrance Stairs-Rails: None Entrance Stairs-Number of Steps: 3   Home Layout: One level Home Equipment: Conservation officer, nature (  2 wheels);Rollator (4 wheels);Toilet riser;Shower seat      Prior Function Prior Level of Function : Independent/Modified Independent             Mobility Comments: Indep with ADLs, household  and community mobilization without assist device; 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 [employer]        Extremity/Trunk Assessment   Upper Extremity Assessment Upper Extremity Assessment: Overall WFL for tasks assessed    Lower Extremity Assessment Lower Extremity Assessment: Overall WFL for tasks assessed (grossly at least 4/5 throughout; denies numbness/tingling)       Communication   Communication: No difficulties  Cognition Arousal/Alertness: Awake/alert Behavior During Therapy: WFL for tasks assessed/performed Overall Cognitive Status: Within Functional Limits for tasks assessed                                          General Comments      Exercises Other Exercises Other Exercises: Reviewed back precautions and functional implications; patient voiced understanding.   Assessment/Plan    PT Assessment Patient needs continued PT services  PT Problem List Decreased strength;Decreased range of motion;Decreased activity tolerance;Decreased mobility;Decreased balance;Decreased coordination;Decreased cognition;Decreased knowledge of use of DME;Decreased safety awareness;Decreased knowledge of precautions;Cardiopulmonary status limiting activity       PT Treatment Interventions DME instruction;Gait training;Stair training;Functional mobility training;Therapeutic activities;Therapeutic exercise;Balance training;Patient/family education    PT Goals (Current goals can be found in the Care Plan section)  Acute Rehab PT Goals Patient Stated Goal: to return home PT Goal Formulation: With patient Time For Goal Achievement: 12/25/22 Potential to Achieve Goals: Good    Frequency 7X/week     Co-evaluation               AM-PAC PT "6 Clicks" Mobility  Outcome Measure Help needed turning from your back to your side while in a flat bed without using bedrails?: None Help needed moving from lying on your back to sitting on  the side of a flat bed without using bedrails?: None Help needed moving to and from a bed to a chair (including a wheelchair)?: A Little Help needed standing up from a chair using your arms (e.g., wheelchair or bedside chair)?: A Little Help needed to walk in hospital room?: A Little Help needed climbing 3-5 steps with a railing? : A Little 6 Click Score: 20    End of Session   Activity Tolerance: Patient tolerated treatment well Patient left: in chair;with call bell/phone within reach (alarm not required; RN/CNA informed/aware of patient position and status; patient aware of need for assist with mobility) Nurse Communication: Mobility status PT Visit Diagnosis: Difficulty in walking, not elsewhere classified (R26.2)    Time: 1016-1030 PT Time Calculation (min) (ACUTE ONLY): 14 min   Charges:   PT Evaluation $PT Eval Low Complexity: 1 Low          .kb

## 2022-12-11 NOTE — TOC Progression Note (Signed)
Transition of Care [facility]) - Progression Note    Patient Details  Name: Andrea Mcdonald MRN: [id number] Date of Birth: 15-Feb-1961  Transition of Care [facility]) CM/SW [place], RN Phone Number: 12/11/2022, 11:19 AM  Clinical Narrative:      Reached out to [facility]  to see if they  can accept the patient for [facility], they are able to accept her, She will need a 3 in 1, [employer] to deliver to the bedside   Expected Discharge Plan: [place] Barriers to Discharge: Continued Medical Work up  Expected Discharge Plan and Services   Discharge Planning Services: CM Consult   Living arrangements for the past 2 months: Single Family Home                 DME Arranged: 3-N-1 DME Agency: [employer] Date DME Agency Contacted: 12/11/22 Time DME Agency Contacted: 1118 Representative spoke with at DME Agency: Suanne Marker HH Arranged: PT, OT [place] Agency: [place] Date [place]: 12/11/22 Time [place]: 1119 Representative spoke with at [place]: Victoria Vera Determinants of Health ([place]) Interventions SDOH Screenings   Food Insecurity: No Food Insecurity (12/10/2022)  Housing: Low Risk  (12/10/2022)  Transportation Needs: No Transportation Needs (12/10/2022)  Utilities: Not At Risk (12/10/2022)  Depression (PHQ2-9): Medium Risk (11/27/2022)  Tobacco Use: Low Risk  (12/11/2022)    Readmission Risk Interventions     No data to display

## 2022-12-12 LAB — GLUCOSE, CAPILLARY
Glucose-Capillary: 177 mg/dL — ABNORMAL HIGH (ref 70–99)
Glucose-Capillary: 185 mg/dL — ABNORMAL HIGH (ref 70–99)

## 2022-12-12 MED ORDER — METHOCARBAMOL 500 MG PO TABS: 500.0000 mg | ORAL_TABLET | Freq: Four times a day (QID) | ORAL | 0 refills | Status: DC | PRN

## 2022-12-12 MED ORDER — OXYCODONE HCL 5 MG PO TABS: 5.0000 mg | ORAL_TABLET | Freq: Four times a day (QID) | ORAL | 0 refills | Status: AC | PRN

## 2022-12-12 MED ORDER — ONDANSETRON HCL 4 MG PO TABS: 4.0000 mg | ORAL_TABLET | Freq: Four times a day (QID) | ORAL | 0 refills | Status: DC | PRN

## 2022-12-12 MED ORDER — SENNA 8.6 MG PO TABS: 1.0000 | ORAL_TABLET | Freq: Two times a day (BID) | ORAL | 0 refills | Status: DC

## 2022-12-12 MED ORDER — HYDROXYZINE HCL 10 MG PO TABS: 10.0000 mg | ORAL_TABLET | Freq: Three times a day (TID) | ORAL | 0 refills | Status: DC | PRN

## 2022-12-12 NOTE — Progress Notes (Signed)
    Attending Progress Note  History: Andrea Mcdonald is s/p L4-5 XLIF for spondylolisthesis and neurogenic claudication.  POD2 NAEO. Patient reports itching in her back today  POD1: Pt denies any concerns this morning. She has not yet ambulated but denies any leg pain  Physical Exam: Vitals:   12/11/22 2317 12/12/22 0759  BP: (!) 113/59 (!) 109/56  Pulse: 90 87  Resp: 20 18  Temp: 99.4 F (37.4 C) 99.6 F (37.6 C)  SpO2: 94%     AA Ox3 CNI  Strength:5/5 throughout BLE  Incisions covered with post-op bandages   Data:  Other tests/results: none   Assessment/Plan:  Andrea Mcdonald is a pleasant 63 y.o s/p L4-5 XLIF for spondylolisthesis and neurogenic claudication  - mobilize - pain control - DVT prophylaxis - will start hydroxyzine for itching - PTOT; plan for d/c home with [place] PA-C Department of Neurosurgery

## 2022-12-12 NOTE — Discharge Instructions (Signed)
  Your surgeon has performed an operation on your lumbar spine (low back) to relieve pressure on one or more nerves. Many times, patients feel better immediately after surgery and can "overdo it." Even if you feel well, it is important that you follow these activity guidelines. If you do not let your back heal properly from the surgery, you can increase the chance of a disc herniation and/or return of your symptoms. The following are instructions to help in your recovery once you have been discharged from the hospital.  Activity    No bending, lifting, or twisting ("BLT"). Avoid lifting objects heavier than 10 pounds (gallon milk jug).  Where possible, avoid household activities that involve lifting, bending, pushing, or pulling such as laundry, vacuuming, grocery shopping, and childcare. Try to arrange for help from friends and family for these activities while your back heals.  Increase physical activity slowly as tolerated.  Taking short walks is encouraged, but avoid strenuous exercise. Do not jog, run, bicycle, lift weights, or participate in any other exercises unless specifically allowed by your doctor. Avoid prolonged sitting, including car rides.  Talk to your doctor before resuming sexual activity.  You should not drive until cleared by your doctor.  Until released by your doctor, you should not return to work or school.  You should rest at home and let your body heal.   You may shower two days after your surgery.  After showering, lightly dab your incision dry. Do not take a tub bath or go swimming for 3 weeks, or until approved by your doctor at your follow-up appointment.  If you smoke, we strongly recommend that you quit.  Smoking has been proven to interfere with normal healing in your back and will dramatically reduce the success rate of your surgery. Please contact [facility] (800-QUIT-NOW) and use the resources at www.[facility].com for assistance in stopping smoking.  Surgical  Incision   If you have a dressing on your incision, you may remove it two days after your surgery. Keep your incision area clean and dry.  Your incision was closed with Dermabond glue. The glue should begin to peel away within about a week.  Diet            You may return to your usual diet. Be sure to stay hydrated.  When to Contact us  Although your surgery and recovery will likely be uneventful, you may have some residual numbness, aches, and pains in your back and/or legs. This is normal and should improve in the next few weeks.  However, should you experience any of the following, contact us immediately: New numbness or weakness Pain that is progressively getting worse, and is not relieved by your pain medications or rest Bleeding, redness, swelling, pain, or drainage from surgical incision Chills or flu-like symptoms Fever greater than 101.0 F (38.3 C) Problems with bowel or bladder functions Difficulty breathing or shortness of breath Warmth, tenderness, or swelling in your calf  Contact Information During office hours (Monday-Friday 9 am to 5 pm), please call your physician at [phone] and ask for Berdine Addison After hours and weekends, please call [phone] and speak with the neurosurgeon on call For a life-threatening emergency, call 911

## 2022-12-12 NOTE — Plan of Care (Signed)
  Problem: Education: Goal: Ability to verbalize activity precautions or restrictions will improve Outcome: Progressing Goal: Knowledge of the prescribed therapeutic regimen will improve Outcome: Progressing Goal: Understanding of discharge needs will improve Outcome: Progressing   Problem: Activity: Goal: Ability to avoid complications of mobility impairment will improve Outcome: Progressing Goal: Ability to tolerate increased activity will improve Outcome: Progressing Goal: Will remain free from falls Outcome: Progressing   Problem: Bowel/Gastric: Goal: Gastrointestinal status for postoperative course will improve Outcome: Progressing   Problem: Clinical Measurements: Goal: Ability to maintain clinical measurements within normal limits will improve Outcome: Progressing Goal: Postoperative complications will be avoided or minimized Outcome: Progressing Goal: Diagnostic test results will improve Outcome: Progressing   Problem: Pain Management: Goal: Pain level will decrease Outcome: Progressing   Problem: Skin Integrity: Goal: Will show signs of wound healing Outcome: Progressing   Problem: Health Behavior/Discharge Planning: Goal: Identification of resources available to assist in meeting health care needs will improve Outcome: Progressing   Problem: Bladder/Genitourinary: Goal: Urinary functional status for postoperative course will improve Outcome: Progressing   Problem: Education: Goal: Ability to describe self-care measures that may prevent or decrease complications (Diabetes Survival Skills Education) will improve Outcome: Progressing Goal: Individualized Educational Video(s) Outcome: Progressing   Problem: Coping: Goal: Ability to adjust to condition or change in health will improve Outcome: Progressing   Problem: Fluid Volume: Goal: Ability to maintain a balanced intake and output will improve Outcome: Progressing   Problem: Health Behavior/Discharge  Planning: Goal: Ability to identify and utilize available resources and services will improve Outcome: Progressing Goal: Ability to manage health-related needs will improve Outcome: Progressing   Problem: Metabolic: Goal: Ability to maintain appropriate glucose levels will improve Outcome: Progressing   Problem: Nutritional: Goal: Maintenance of adequate nutrition will improve Outcome: Progressing Goal: Progress toward achieving an optimal weight will improve Outcome: Progressing   Problem: Skin Integrity: Goal: Risk for impaired skin integrity will decrease Outcome: Progressing   Problem: Tissue Perfusion: Goal: Adequacy of tissue perfusion will improve Outcome: Progressing   Problem: Education: Goal: Knowledge of General Education information will improve Description: Including pain rating scale, medication(s)/side effects and non-pharmacologic comfort measures Outcome: Progressing   Problem: Health Behavior/Discharge Planning: Goal: Ability to manage health-related needs will improve Outcome: Progressing   Problem: Clinical Measurements: Goal: Ability to maintain clinical measurements within normal limits will improve Outcome: Progressing Goal: Will remain free from infection Outcome: Progressing Goal: Diagnostic test results will improve Outcome: Progressing Goal: Respiratory complications will improve Outcome: Progressing Goal: Cardiovascular complication will be avoided Outcome: Progressing   Problem: Activity: Goal: Risk for activity intolerance will decrease Outcome: Progressing   Problem: Nutrition: Goal: Adequate nutrition will be maintained Outcome: Progressing   Problem: Coping: Goal: Level of anxiety will decrease Outcome: Progressing   Problem: Elimination: Goal: Will not experience complications related to bowel motility Outcome: Progressing Goal: Will not experience complications related to urinary retention Outcome: Progressing    Problem: Pain Managment: Goal: General experience of comfort will improve Outcome: Progressing   Problem: Safety: Goal: Ability to remain free from injury will improve Outcome: Progressing   Problem: Skin Integrity: Goal: Risk for impaired skin integrity will decrease Outcome: Progressing

## 2022-12-12 NOTE — Discharge Summary (Signed)
Discharge Summary  Patient ID: Andrea Mcdonald MRN: [id number] DOB/AGE: December 06, 1960 62 y.o.  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 Her intraoperative course was uncomplicated and she was admitted for therapy evaluation and pain control. She did well with therapy and was deemed appropriate for discharge home on POD2. She reported some anterior thigh pain but felt her pain was well controlled. She admitted to some itching in her posterior incisions and was found to have some redness along the top of her post-op bandage. She was discharged with Oxycodone, Robaxin, Senna, Hydroxyzine, and Zofran.   Consults: None  Significant Diagnostic Studies: none  Treatments: surgery: as above. Please see separately dictated operative report for further details  Discharge Exam: Blood pressure (!) 109/56, pulse 87, temperature 99.6 F (37.6 C), temperature source Oral, resp. rate 18, height 5' 5"$  (1.651 m), weight 78.8 kg, SpO2 94 %. CN II-XII grossly intact 5/5 throughout BLE Right lateral incision covered with clean post-op dressing. Posterior incisions c/d/I with dermabond in place. There was a line of erythema at the most superior portion of the dressing appearing to be contact dermatitis.   Disposition: Discharge disposition: 01-Home or Self  Care       Discharge Instructions     Incentive spirometry RT   Complete by: As directed       Allergies as of 12/12/2022       Reactions   Codeine Itching        Medication List     STOP taking these medications    meloxicam 15 MG tablet Commonly known as: MOBIC       TAKE these medications    CAYENNE PEPPER PO Take 1 tablet by mouth daily.   gabapentin 300 MG capsule Commonly known as: NEURONTIN Take 300 mg by mouth 2 (two) times daily.   HAIR SKIN & NAILS GUMMIES PO Take 2 tablets by mouth daily at 6 (six) AM.   hydrOXYzine 10 MG tablet Commonly known as: ATARAX Take 1 tablet (10 mg total) by mouth 3 (three) times daily as needed for itching.   lisinopril 5 MG tablet Commonly known as: ZESTRIL Take 1 tablet by mouth once daily What changed: when to take this   metFORMIN 500 MG 24 hr tablet Commonly known as: GLUCOPHAGE-XR Take 1 tablet (500 mg total) by mouth 2 (two) times daily with a meal.   methocarbamol 500 MG tablet Commonly known as: ROBAXIN Take 1 tablet (500 mg total) by mouth every 6 (six) hours as needed for muscle spasms. What changed:  when to take this reasons to take this   ondansetron 4 MG tablet Commonly known as: ZOFRAN Take 1 tablet (4 mg total) by mouth every 6 (six) hours as needed for nausea or vomiting.   oxyCODONE 5 MG immediate release tablet Commonly known as: Oxy IR/ROXICODONE Take 1 tablet (5 mg total) by mouth every 6 (six) hours as needed for up to 3 days for  severe pain ((score 4 to 6)).   rosuvastatin 20 MG tablet Commonly known as: CRESTOR TAKE 1 TABLET BY MOUTH AT BEDTIME   senna 8.6 MG Tabs tablet Commonly known as: SENOKOT Take 1 tablet (8.6 mg total) by mouth 2 (two) times daily.   sertraline 100 MG tablet Commonly known as: ZOLOFT TAKE 1 & 1/2 (ONE & ONE-HALF) TABLETS BY MOUTH AT BEDTIME What changed: See the new instructions.   Trulicity 3 0000000 Sopn Generic drug: Dulaglutide Inject 3 mg  as directed once a week. What changed: additional instructions   VITAMIN C GUMMIES PO Take 2 tablets by mouth daily at 6 (six) AM.               Durable Medical Equipment  (From admission, onward)           Start     Ordered   12/11/22 1116  For home use only DME 3 n 1  Once        12/11/22 1115            Follow-up Information     Loleta Dicker, PA Follow up in 2 week(s).   Specialty: Neurosurgery Contact information: [address] [place] [address] [phone]                 Signed: Loleta Dicker 12/12/2022, 10:16 AM

## 2022-12-12 NOTE — Progress Notes (Signed)
Occupational Therapy Treatment Patient Details Name: Andrea Mcdonald MRN: [id number] DOB: 1961-05-17 Today's Date: 12/12/2022   History of present illness admitted for acute hospitalization s/p XLIF due to spondylolisthesis, neurogenic claudication (12/10/22)   OT comments  Chart reviewed, pt greeted in bed agreeable to brief ADL session focused on use of DME. Pt trialed use of reacher, demo provided re: use of sock aid. Pt continued to require MIN-MOD A for LB dressing (pants) with use of reacher, encouraged her to continue to trial alternate means of LB dressing with home health therapist as appropriate, recommended use of reacher to avoid trunk flexion. Pt reports husband will assist as needed. Pt is left as received, alll needs met. OT will follow acutely.     Recommendations for follow up therapy are one component of a multi-disciplinary discharge planning process, led by the attending physician.  Recommendations may be updated based on patient status, additional functional criteria and insurance authorization.    Follow Up Recommendations  Home health OT     Assistance Recommended at Discharge None  Patient can return home with the following  A little help with walking and/or transfers;A little help with bathing/dressing/bathroom;Assistance with cooking/housework;Help with stairs or ramp for entrance;Assist for transportation   Equipment Recommendations  BSC/3in1    Recommendations for Other Services      Precautions / Restrictions Precautions Precautions: Fall;Back Restrictions Weight Bearing Restrictions: No       Mobility Bed Mobility Overal bed mobility: Needs Assistance Bed Mobility: Supine to Sit, Sit to Supine     Supine to sit: Supervision Sit to supine: Supervision   General bed mobility comments: intermittent vcs for body mechanics    Transfers Overall transfer level: Needs assistance Equipment used: Rolling walker (2 wheels) Transfers: Sit to/from  Stand Sit to Stand: Supervision                 Balance Overall balance assessment: Needs assistance Sitting-balance support: Feet supported Sitting balance-Leahy Scale: Normal     Standing balance support: Bilateral upper extremity supported, Reliant on assistive device for balance Standing balance-Leahy Scale: Good                             ADL either performed or assessed with clinical judgement   ADL Overall ADL's : Needs assistance/impaired                     Lower Body Dressing: Minimal assistance;Moderate assistance Lower Body Dressing Details (indicate cue type and reason): trialed use of reacher with step by step vcs                    Extremity/Trunk Assessment              Vision       Perception     Praxis      Cognition Arousal/Alertness: Awake/alert Behavior During Therapy: WFL for tasks assessed/performed Overall Cognitive Status: Within Functional Limits for tasks assessed                                          Exercises      Shoulder Instructions       General Comments      Pertinent Vitals/ Pain       Pain Assessment Pain Assessment: Faces Faces Pain Scale: Hurts  little more Pain Location: groin area Pain Descriptors / Indicators: Grimacing, Discomfort Pain Intervention(s): Limited activity within patient's tolerance, Monitored during session, Repositioned  Home Living                                          Prior Functioning/Environment              Frequency  Min 2X/week        Progress Toward Goals  OT Goals(current goals can now be found in the care plan section)  Progress towards OT goals: Progressing toward goals     Plan Discharge plan remains appropriate    Co-evaluation                 AM-PAC OT "6 Clicks" Daily Activity     Outcome Measure   Help from another person eating meals?: None Help from another person taking  care of personal grooming?: None Help from another person toileting, which includes using toliet, bedpan, or urinal?: None Help from another person bathing (including washing, rinsing, drying)?: A Little Help from another person to put on and taking off regular upper body clothing?: None Help from another person to put on and taking off regular lower body clothing?: A Little 6 Click Score: 22    End of Session Equipment Utilized During Treatment: Rolling walker (2 wheels)  OT Visit Diagnosis: Unsteadiness on feet (R26.81);Muscle weakness (generalized) (M62.81)   Activity Tolerance Patient tolerated treatment well   Patient Left in bed;with call bell/phone within reach     Time: [id number] OT Time Calculation (min): 10 min  Charges: OT General Charges $OT Visit: 1 Visit OT Treatments $Self Care/Home Management : 8-22 mins Shanon Payor, OTD OTR/L  12/12/22, 3:34 PM

## 2022-12-12 NOTE — Progress Notes (Addendum)
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 Pt reported of 6/10 pain which decreased to 5/10 at the end of the session. PT educated pt about  movement and healing. PT reviewed back precaution of BLT, HEP in writing on white board  and H/O to promote safe healing. Pt demonstrated good recall memory. Pt ambulated in the room and in the hallways with FWW with decreased loading response and guarded gait 2/2 to pain. Pt advised to WB on B feet when standing still and to not to sit >30 mins in one position. Pt Attempted 3 steps using one HR with min guard of 1. Pt's spouse will assist pt at home as per pt. Pt understood the sequencing well. Pt will continue in acute care and pt will benefit from HHPT after acute care.      Recommendations for follow up therapy are one component of a multi-disciplinary discharge planning process, led by the attending physician.  Recommendations may be updated based on patient status, additional functional criteria and insurance authorization.  Follow Up Recommendations  Home health PT     Assistance Recommended at Discharge PRN  Patient can return home with the following A little help with walking and/or transfers;A little help with bathing/dressing/bathroom   Equipment Recommendations       Recommendations for Other Services       Precautions / Restrictions Precautions Precautions: Fall;Back Restrictions Weight Bearing Restrictions: No     Mobility  Bed Mobility Overal bed mobility: Independent             General bed mobility comments: slow and guarded.    Transfers Overall transfer  level: Needs assistance Equipment used: Rolling walker (2 wheels) Transfers: Sit to/from Stand Sit to Stand: Supervision           General transfer comment: slow and guarded    Ambulation/Gait Ambulation/Gait assistance: Supervision Gait Distance (Feet): 135 Feet Assistive device: Rolling walker (2 wheels) Gait Pattern/deviations: Step-through pattern, Decreased stride length, Antalgic Gait velocity: dec     General Gait Details: Relies on FWW to decrease loading resposne, and guarded.   Stairs Stairs: Yes Stairs assistance: Min assist, Min guard Stair Management: One rail Right Number of Stairs: 3 General stair comments: Pt needs one person assist 2/2 to pain in L/S   Wheelchair Mobility    Modified Rankin (Stroke Patients Only)       Balance Overall balance assessment: Needs assistance Sitting-balance support: Feet supported Sitting balance-Leahy Scale: Normal Sitting balance - Comments: sits  guarded 2/2 to pain   Standing balance support: Bilateral upper extremity supported, Reliant on assistive device for balance Standing balance-Leahy Scale: Good Standing balance comment: slow but steady on feet with FWW.                            Cognition Arousal/Alertness: Awake/alert Behavior During Therapy: WFL for tasks assessed/performed Overall Cognitive Status: Within Functional Limits for tasks assessed  Exercises General Exercises - Lower Extremity Ankle Circles/Pumps: AROM, 10 reps, Both Gluteal Sets: Both, 10 reps, Supine Heel Slides: AROM, 10 reps, Supine, Both Other Exercises Other Exercises: Pt advised to do the same every 2 to 3 hours 10 reps each    General Comments        Pertinent Vitals/Pain Pain Assessment Pain Assessment: 0-10 Pain Score: 6  Pain Location: Back and legs Pain Descriptors / Indicators: Aching, Tingling Pain Intervention(s): Monitored during session     Home Living                          Prior Function            PT Goals (current goals can now be found in the care plan section) Acute Rehab PT Goals Patient Stated Goal: to return home PT Goal Formulation: With patient Time For Goal Achievement: 12/25/22 Potential to Achieve Goals: Good Progress towards PT goals: Progressing toward goals    Frequency    7X/week      PT Plan Current plan remains appropriate    Co-evaluation              AM-PAC PT "6 Clicks" Mobility   Outcome Measure  Help needed turning from your back to your side while in a flat bed without using bedrails?: None Help needed moving from lying on your back to sitting on the side of a flat bed without using bedrails?: None Help needed moving to and from a bed to a chair (including a wheelchair)?: A Little Help needed standing up from a chair using your arms (e.g., wheelchair or bedside chair)?: A Little Help needed to walk in hospital room?: A Little Help needed climbing 3-5 steps with a railing? : A Little 6 Click Score: 20    End of Session Equipment Utilized During Treatment: Gait belt Activity Tolerance: Patient tolerated treatment well Patient left: in bed;with bed alarm set;with call bell/phone within reach Nurse Communication: Mobility status PT Visit Diagnosis: Difficulty in walking, not elsewhere classified (R26.2)     Time: [id number] PT Time Calculation (min) (ACUTE ONLY): 26 min  Charges:  $Gait Training: 8-22 mins $Therapeutic Activity: 8-22 mins                    Evangelia Whitaker PT DPT 11:29 AM,12/12/22

## 2022-12-12 NOTE — TOC Progression Note (Signed)
Transition of Care [facility]) - Progression Note    Patient Details  Name: Andrea Mcdonald MRN: [id number] Date of Birth: February 04, 1961  [facility] CM/SW Contact  Conception Oms, RN Phone Number: 12/12/2022, 10:23 AM  Clinical Narrative:     [name] accepted the patient for [facility] services  Expected Discharge Plan: [place] Barriers to Discharge: Continued Medical Work up  Expected Discharge Plan and Services   Discharge Planning Services: CM Consult   Living arrangements for the past 2 months: [place] Expected Discharge Date: 12/12/22               DME Arranged: 3-N-1 DME Agency: [employer] Date DME Agency Contacted: 12/11/22 Time DME Agency Contacted: 1118 Representative spoke with at DME Agency: [place]: PT, OT [place] Agency: [place] Date [place]: 12/11/22 Time [place]: 1119 Representative spoke with at [place]: [place] ([place]) Interventions SDOH Screenings   Food Insecurity: No Food Insecurity (12/10/2022)  Housing: Low Risk  (12/10/2022)  Transportation Needs: No Transportation Needs (12/10/2022)  Utilities: Not At Risk (12/10/2022)  Depression (PHQ2-9): Medium Risk (11/27/2022)  Tobacco Use: Low Risk  (12/11/2022)    Readmission Risk Interventions     No data to display

## 2022-12-15 ENCOUNTER — Telehealth: Payer: Self-pay

## 2022-12-15 NOTE — Transitions of Care (Post Inpatient/ED Visit) (Signed)
   12/15/2022  Name: Andrea Mcdonald MRN: [id number] DOB: November 30, 1960  Today's TOC FU Call Status: Today's TOC FU Call Status:: Successful TOC FU Call Competed TOC FU Call Complete Date: 12/15/22  Transition Care Management Follow-up Telephone Call Date of Discharge: 12/12/22 Discharge Facility: [facility] Type of Discharge: Inpatient Admission Primary Inpatient Discharge Diagnosis:: S/P lumbar fusion How have you been since you were released from the hospital?: Better Any questions or concerns?: No  Items Reviewed: Did you receive and understand the discharge instructions provided?: Yes Medications obtained and verified?: Yes (Medications Reviewed) Any new allergies since your discharge?: No Dietary orders reviewed?: NA Do you have support at home?: Yes People in Home: significant other  Home Care and Equipment/Supplies: [place] Ordered?: NA Any new equipment or medical supplies ordered?: NA  Functional Questionnaire: Do you need assistance with bathing/showering or dressing?: No Do you need assistance with meal preparation?: No Do you need assistance with eating?: No Do you have difficulty maintaining continence: No Do you need assistance with getting out of bed/getting out of a chair/moving?: No Do you have difficulty managing or taking your medications?: No  Folllow up appointments reviewed: PCP Follow-up appointment confirmed?: NA Specialist Hospital Follow-up appointment confirmed?: Yes Date of Specialist follow-up appointment?: 12/25/22 Follow-Up Specialty Provider:: Cooper Render NP Do you need transportation to your follow-up appointment?: No Do you understand care options if your condition(s) worsen?: Yes-patient verbalized understanding    [place] LPN [place] Direct Dial [phone]

## 2022-12-25 ENCOUNTER — Encounter: Payer: Self-pay | Admitting: Neurosurgery

## 2022-12-25 ENCOUNTER — Ambulatory Visit (INDEPENDENT_AMBULATORY_CARE_PROVIDER_SITE_OTHER): Payer: BC Managed Care – PPO | Admitting: Neurosurgery

## 2022-12-25 VITALS — BP 118/60 | HR 80 | Temp 97.7°F | Ht 65.0 in | Wt 168.6 lb

## 2022-12-25 DIAGNOSIS — M48062 Spinal stenosis, lumbar region with neurogenic claudication: Secondary | ICD-10-CM

## 2022-12-25 DIAGNOSIS — G8929 Other chronic pain: Secondary | ICD-10-CM

## 2022-12-25 DIAGNOSIS — Z09 Encounter for follow-up examination after completed treatment for conditions other than malignant neoplasm: Secondary | ICD-10-CM

## 2022-12-25 DIAGNOSIS — Z981 Arthrodesis status: Secondary | ICD-10-CM

## 2022-12-25 DIAGNOSIS — M4316 Spondylolisthesis, lumbar region: Secondary | ICD-10-CM

## 2022-12-25 DIAGNOSIS — M5441 Lumbago with sciatica, right side: Secondary | ICD-10-CM

## 2022-12-25 NOTE — Progress Notes (Signed)
   REFERRING PHYSICIAN:  Andee Poles [place],  [place] [address]  DOS: 12/10/22 L4-5 XLIF   HISTORY OF PRESENT ILLNESS: Andrea Mcdonald is approximately 2 weeks status post lumbar fusion. she is doing well with complete resolution of her preoperative leg pain.  She reports continued right groin pain but states overall this is better than it was immediately postop.  She is only intermittently taking oxycodone for pain.  She has some incisional discomfort but denies any drainage from her incisions.  PHYSICAL EXAMINATION:  General: Patient is well developed, well nourished, calm, collected, and in no apparent distress.   NEUROLOGICAL:  General: In no acute distress.   Awake, alert, oriented to person, place, and time.  Pupils equal round and reactive to light.  Facial tone is symmetric.  Tongue protrusion is midline.  There is no pronator drift.   Strength:            Side Iliopsoas Quads Hamstring PF DF EHL  R 4+ 5 5 5 5 5  $ L 5 5 5 5 5 5   $ Incision c/d/i   ROS (Neurologic):  Negative except as noted above  IMAGING: No interval imaging to review  ASSESSMENT/PLAN:  Andrea Mcdonald is doing well approximately 2 weeks after lumbar fusion.  She is doing reasonably well despite some discomfort in her incisions and continued groin pain.  Overall she is very pleased with her postoperative outcome thus far.  We discussed activity escalation and I have advised the patient to lift up to 10 pounds until 6 weeks after surgery, then increase up to 25 pounds until 12 weeks after surgery.  After 12 weeks post-op, the patient advised to increase activity as tolerated. she will follow up 4 weeks with x-rays prior.  She was reminded of our clinic moving and given the address.  Advised to contact the office if any questions or concerns arise.  Cooper Render PA-C Department of neurosurgery

## 2022-12-29 ENCOUNTER — Ambulatory Visit: Payer: BC Managed Care – PPO | Admitting: Family Medicine

## 2022-12-30 ENCOUNTER — Ambulatory Visit: Payer: BC Managed Care – PPO | Admitting: Podiatry

## 2022-12-31 ENCOUNTER — Telehealth: Payer: Self-pay

## 2022-12-31 NOTE — Telephone Encounter (Signed)
-----   Message from Peggyann Shoals sent at 12/31/2022  1:57 PM EST ----- Regarding: reporting a fall Contact: [phone] L4-5 XLIF/PSF on 12/10/22 Patient is calling that she fell and landed on her knees. No new symptoms. Her pain is the same as always. Should she be concerned? Should she be seen?

## 2023-01-01 NOTE — Telephone Encounter (Signed)
She is using ice and heat on her back. No new back pain. She will monitor her symptoms and if needed to she call to be seen sooner.

## 2023-01-01 NOTE — Telephone Encounter (Signed)
Can you please let her know about [place] message?

## 2023-01-14 ENCOUNTER — Ambulatory Visit: Payer: BC Managed Care – PPO | Admitting: Podiatry

## 2023-01-20 ENCOUNTER — Other Ambulatory Visit: Payer: Self-pay

## 2023-01-20 DIAGNOSIS — Z981 Arthrodesis status: Secondary | ICD-10-CM

## 2023-01-22 ENCOUNTER — Ambulatory Visit
Admission: RE | Admit: 2023-01-22 | Discharge: 2023-01-22 | Disposition: A | Discharge status: Home / Self Care | Payer: BC Managed Care – PPO | Attending: Neurosurgery | Admitting: Neurosurgery

## 2023-01-22 ENCOUNTER — Ambulatory Visit (INDEPENDENT_AMBULATORY_CARE_PROVIDER_SITE_OTHER): Payer: BC Managed Care – PPO | Admitting: Neurosurgery

## 2023-01-22 ENCOUNTER — Ambulatory Visit
Admission: RE | Admit: 2023-01-22 | Discharge: 2023-01-22 | Disposition: A | Discharge status: Home / Self Care | Payer: BC Managed Care – PPO | Source: Ambulatory Visit | Attending: Neurosurgery | Admitting: Neurosurgery

## 2023-01-22 ENCOUNTER — Encounter: Payer: Self-pay | Admitting: Neurosurgery

## 2023-01-22 VITALS — BP 100/60 | Ht 65.0 in | Wt 168.6 lb

## 2023-01-22 DIAGNOSIS — Z981 Arthrodesis status: Secondary | ICD-10-CM | POA: Insufficient documentation

## 2023-01-22 DIAGNOSIS — M48062 Spinal stenosis, lumbar region with neurogenic claudication: Secondary | ICD-10-CM

## 2023-01-22 DIAGNOSIS — M4316 Spondylolisthesis, lumbar region: Secondary | ICD-10-CM

## 2023-01-22 DIAGNOSIS — Z09 Encounter for follow-up examination after completed treatment for conditions other than malignant neoplasm: Secondary | ICD-10-CM

## 2023-01-22 NOTE — Progress Notes (Signed)
   REFERRING PHYSICIAN:  Andee Poles [place],  [place] [address]  DOS: 12/10/22 L4-5 XLIF   HISTORY OF PRESENT ILLNESS: Andrea Mcdonald is status post lumbar fusion. she is doing well with complete resolution of her preoperative leg pain.    She has some pain down her right leg when she sits to drive but is otherwise doing well.  PHYSICAL EXAMINATION:  General: Patient is well developed, well nourished, calm, collected, and in no apparent distress.   NEUROLOGICAL:  General: In no acute distress.   Awake, alert, oriented to person, place, and time.  Pupils equal round and reactive to light.  Facial tone is symmetric.  Tongue protrusion is midline.  There is no pronator drift.   Strength:            Side Iliopsoas Quads Hamstring PF DF EHL  R 4+ 5 5 5 5 5   L 5 5 5 5 5 5    Incision c/d/i   ROS (Neurologic):  Negative except as noted above  IMAGING: No complications noted  ASSESSMENT/PLAN:  Andrea Mcdonald is doing well after lumbar fusion.  She is doing reasonably well.  We reviewed her activity limitations.  Will see her back in 6 weeks.  She is on a 25 pound lifting limit.  Meade Maw MD Department of neurosurgery

## 2023-02-16 ENCOUNTER — Other Ambulatory Visit: Payer: Self-pay | Admitting: Physician Assistant

## 2023-02-16 DIAGNOSIS — E1169 Type 2 diabetes mellitus with other specified complication: Secondary | ICD-10-CM

## 2023-02-17 NOTE — Telephone Encounter (Signed)
Requested Prescriptions  Pending Prescriptions Disp Refills   Dulaglutide (TRULICITY) 3 MG/0.5ML SOPN [Pharmacy Med Name: Trulicity 3 MG/0.5ML Subcutaneous Solution Pen-injector] 6 mL 0    Sig: Inject 3 mg as directed once a week. Sundays     Endocrinology:  Diabetes - GLP-1 Receptor Agonists Passed - 02/16/2023  6:51 AM      Passed - HBA1C is between 0 and 7.9 and within 180 days    Hgb A1c MFr Bld  Date Value Ref Range Status  11/25/2022 7.5 (H) 4.8 - 5.6 % Final    Comment:    (NOTE) Pre diabetes:          5.7%-6.4%  Diabetes:              >6.4%  Glycemic control for   <7.0% adults with diabetes          Passed - Valid encounter within last 6 months    Recent Outpatient Visits           2 months ago Pre-op examination   [employer] [facility] [place], Netta Neat, DO   4 months ago Type 2 diabetes mellitus with other specified complication, without long-term current use of insulin [facility])   [place] [facility] [place], Netta Neat, DO   5 months ago Type 2 diabetes mellitus with other specified complication, without long-term current use of insulin [facility])   [place] [facility] [place], Oswaldo Conroy, [place]   1 year ago Gross hematuria    [facility] S[place] Cords, DO   1 year ago Hematuria, unspecified type   [facility] [place], Netta Neat, [place]

## 2023-02-21 ENCOUNTER — Other Ambulatory Visit: Payer: Self-pay | Admitting: Family Medicine

## 2023-02-21 DIAGNOSIS — F3342 Major depressive disorder, recurrent, in full remission: Secondary | ICD-10-CM

## 2023-02-23 NOTE — Telephone Encounter (Signed)
Requested Prescriptions  Pending Prescriptions Disp Refills   sertraline (ZOLOFT) 100 MG tablet [Pharmacy Med Name: Sertraline HCl 100 MG Oral Tablet] 135 tablet 0    Sig: TAKE ONE AND ONE-HALF TABLETS BY MOUTH AT BEDTIME     Psychiatry:  Antidepressants - SSRI - sertraline Passed - 02/21/2023  9:46 AM      Passed - AST in normal range and within 360 days    AST  Date Value Ref Range Status  11/27/2022 13 10 - 35 U/L Final   SGOT(AST)  Date Value Ref Range Status  10/29/2014 20 15 - 37 Unit/L Final         Passed - ALT in normal range and within 360 days    ALT  Date Value Ref Range Status  11/27/2022 14 6 - 29 U/L Final   SGPT (ALT)  Date Value Ref Range Status  10/29/2014 27 U/L Final    Comment:    14-63 NOTE: New Reference Range 05/16/14          Passed - Completed PHQ-2 or PHQ-9 in the last 360 days      Passed - Valid encounter within last 6 months    Recent Outpatient Visits           2 months ago Pre-op examination   [place] [facility] Smitty Cords, DO   5 months ago Type 2 diabetes mellitus with other specified complication, without long-term current use of insulin [facility])   [place] [facility] [place], Netta Neat, [place]   6 months ago Type 2 diabetes mellitus with other specified complication, without long-term current use of insulin [facility])   [place] [facility] [place], Oswaldo Conroy, [place]   1 year ago Gross hematuria   [place] Smitty Cords, DO   1 year ago Hematuria, unspecified type   [facility] [place], Netta Neat, [place]

## 2023-02-25 NOTE — Progress Notes (Signed)
Key [id number] sent to plan fro trulicity approval.

## 2023-02-26 NOTE — Progress Notes (Signed)
Approved on May 1 Request Reference Number: [id number]. TRULICITY INJ 3/0.5 is approved through 02/25/2024. Your patient may now fill this prescription and it will be covered.

## 2023-03-02 ENCOUNTER — Other Ambulatory Visit: Payer: Self-pay

## 2023-03-02 DIAGNOSIS — G8929 Other chronic pain: Secondary | ICD-10-CM

## 2023-03-03 ENCOUNTER — Ambulatory Visit
Admission: RE | Admit: 2023-03-03 | Discharge: 2023-03-03 | Disposition: A | Discharge status: Home / Self Care | Payer: Commercial Managed Care - PPO | Source: Ambulatory Visit | Attending: Neurosurgery | Admitting: Neurosurgery

## 2023-03-03 ENCOUNTER — Encounter: Payer: Self-pay | Admitting: Neurosurgery

## 2023-03-03 ENCOUNTER — Ambulatory Visit (INDEPENDENT_AMBULATORY_CARE_PROVIDER_SITE_OTHER): Payer: Commercial Managed Care - PPO | Admitting: Neurosurgery

## 2023-03-03 VITALS — BP 115/68 | Ht 65.0 in | Wt 168.0 lb

## 2023-03-03 DIAGNOSIS — G8929 Other chronic pain: Secondary | ICD-10-CM | POA: Diagnosis present

## 2023-03-03 DIAGNOSIS — M5416 Radiculopathy, lumbar region: Secondary | ICD-10-CM

## 2023-03-03 DIAGNOSIS — Z981 Arthrodesis status: Secondary | ICD-10-CM | POA: Diagnosis not present

## 2023-03-03 DIAGNOSIS — M5441 Lumbago with sciatica, right side: Secondary | ICD-10-CM | POA: Insufficient documentation

## 2023-03-03 NOTE — Progress Notes (Signed)
   REFERRING PHYSICIAN:  Kasandra Knudsen [address] [place],  [place] [address]  DOS: 12/10/22 L4-5 XLIF   HISTORY OF PRESENT ILLNESS:  Andrea Mcdonald is a 62 year old presenting today status post lumbar fusion. Unfortunately she continues to have right buttock pain that radiates into her perineal area.  This is worse with sitting for longer than 20 minutes.  It does not seem to be worsened by climbing stairs.  It is not reproducible when she palpates the area.  This unfortunately has not improved since her initial postoperative period and is affecting her quality of life.  01/22/23 Andrea Mcdonald is status post lumbar fusion. she is doing well with complete resolution of her preoperative leg pain.    She has some pain down her right leg when she sits to drive but is otherwise doing well.  PHYSICAL EXAMINATION:  General: Patient is well developed, well nourished, calm, collected, and in no apparent distress.   NEUROLOGICAL:  General: In no acute distress.   Awake, alert, oriented to person, place, and time.  Pupils equal round and reactive to light.  Facial tone is symmetric.    Strength:            Side Iliopsoas Quads Hamstring PF DF EHL  R 4+ 5 5 5 5 5   L 5 5 5 5 5 5      ROS (Neurologic):  Negative except as noted above  IMAGING: 03/03/23 lumbar xrays stable  ASSESSMENT/PLAN:  Andrea Mcdonald is about 3 months out from a lumbar fusion.  Unfortunate she continues to have right buttock pain.  We discussed that the differential diagnosis could include piriformis syndrome versus hip pathology versus ongoing lumbar radiculopathy.  We discussed potentially attempting a piriformis injection however injections in the past have caused significant elevation in her blood glucose and she is not interested in considering this.  We discussed conservative management with physical therapy however she would like to have a more definitive explanation for the cause.  I would like  to update a lumbar MRI to evaluate for any ongoing nerve compression that could explain the symptoms.  If this is negative we will consider further workup.  I will contact her via telephone visit after completion of her MRI and discuss further plan of care.  She is otherwise to follow-up with Dr. Myer Haff in 3 months with lumbar x-rays prior.  She was encouraged to call the office in the interim with any questions or concerns.  She expressed understanding and was in agreement with this plan.  I spent a total of 34 minutes in both face-to-face and non-face-to-face activities for this visit on the date of this encounter.   [place] Charity PA-C Department of neurosurgery

## 2023-03-05 ENCOUNTER — Telehealth: Payer: Self-pay | Admitting: Neurosurgery

## 2023-03-05 NOTE — Telephone Encounter (Signed)
Pt call in today, was just seen for post op 5/7 says on 5/8 she fell and tripped backwards landing on her back. DK has an MRI ordered... wants to know if she should come in (DK next available appt isnt until June), should she get repeat xrays? not in extreme pain but says she is feeling a different pain more of a sharp feeling in lower back on right hand side is still taking pain medication  Verbally discussed with SL who said she could see her if DK thought she needed to come in but would probably just order xrays anyway

## 2023-03-05 NOTE — Telephone Encounter (Signed)
Let the pt know to call us back if there is any increased pain and we would send in orders for xrays.

## 2023-03-10 ENCOUNTER — Other Ambulatory Visit: Payer: Self-pay | Admitting: Orthopedic Surgery

## 2023-03-10 ENCOUNTER — Ambulatory Visit
Admission: RE | Admit: 2023-03-10 | Discharge: 2023-03-10 | Disposition: A | Discharge status: Home / Self Care | Payer: Commercial Managed Care - PPO | Attending: Orthopedic Surgery | Admitting: Orthopedic Surgery

## 2023-03-10 ENCOUNTER — Telehealth: Payer: Self-pay | Admitting: Neurosurgery

## 2023-03-10 ENCOUNTER — Ambulatory Visit
Admission: RE | Admit: 2023-03-10 | Discharge: 2023-03-10 | Disposition: A | Discharge status: Home / Self Care | Payer: Commercial Managed Care - PPO | Source: Ambulatory Visit | Attending: Orthopedic Surgery | Admitting: Orthopedic Surgery

## 2023-03-10 DIAGNOSIS — M25511 Pain in right shoulder: Secondary | ICD-10-CM | POA: Diagnosis present

## 2023-03-10 NOTE — Telephone Encounter (Signed)
Patient notified of Danielle's response and agreed to the plan of care.

## 2023-03-10 NOTE — Telephone Encounter (Signed)
She is scheduled for her Mri lumbar on 5/16. Patient mentioned to Duwayne Heck that she was having hip pain. Danielle told the patient that when she had her MRI lumbar should would order a picture of her hips. She called imaging and was told Mri was only for her back. Can you add hips to the order.

## 2023-03-12 ENCOUNTER — Ambulatory Visit
Admission: RE | Admit: 2023-03-12 | Discharge: 2023-03-12 | Disposition: A | Discharge status: Home / Self Care | Payer: Commercial Managed Care - PPO | Source: Ambulatory Visit | Attending: Neurosurgery | Admitting: Neurosurgery

## 2023-03-12 DIAGNOSIS — M5416 Radiculopathy, lumbar region: Secondary | ICD-10-CM | POA: Insufficient documentation

## 2023-03-12 DIAGNOSIS — Z981 Arthrodesis status: Secondary | ICD-10-CM | POA: Insufficient documentation

## 2023-03-26 ENCOUNTER — Ambulatory Visit (INDEPENDENT_AMBULATORY_CARE_PROVIDER_SITE_OTHER): Payer: Commercial Managed Care - PPO | Admitting: Neurosurgery

## 2023-03-26 DIAGNOSIS — M25551 Pain in right hip: Secondary | ICD-10-CM | POA: Diagnosis not present

## 2023-03-26 DIAGNOSIS — M5441 Lumbago with sciatica, right side: Secondary | ICD-10-CM | POA: Diagnosis not present

## 2023-03-26 DIAGNOSIS — G8929 Other chronic pain: Secondary | ICD-10-CM

## 2023-03-26 DIAGNOSIS — M5416 Radiculopathy, lumbar region: Secondary | ICD-10-CM

## 2023-03-26 DIAGNOSIS — Z981 Arthrodesis status: Secondary | ICD-10-CM

## 2023-03-26 NOTE — Progress Notes (Signed)
Neurosurgery Telephone (Audio-Only) Note  Requesting Provider     Smitty Cords, DO [address] [place],  [place] [address] T: [phone] F: [phone]  Primary Care Provider Smitty Cords, DO [address] [address] T: [phone] F: [phone]  Telehealth visit was conducted with Lawernce Keas, a 62 y.o. female via telephone.   History of Present Illness: Ms. Palmertree is a pleasant 62 year old presenting today for discussion of MRI results.  She continues to have right buttock and groin pain particularly with climbing stairs or sitting for longer than 20 minutes.  She feels like this worsened after a fall on May 8.  She denies any new symptoms.  LOV 03/03/23 Teneille Kolakowski is a 62 year old presenting today status post lumbar fusion. Unfortunately she continues to have right buttock pain that radiates into her perineal area.  This is worse with sitting for longer than 20 minutes.  It does not seem to be worsened by climbing stairs.  It is not reproducible when she palpates the area.  This unfortunately has not improved since her initial postoperative period and is affecting her quality of life.   01/22/23 Lawernce Keas is status post lumbar fusion. she is doing well with complete resolution of her preoperative leg pain.     She has some pain down her right leg when she sits to drive but is otherwise doing well.    General Review of Systems:  A ROS was performed including pertinent positive and negatives as documented.  All other systems are negative   Prior to Admission medications   Medication Sig Start Date End Date Taking? Authorizing Provider  Capsicum, Cayenne, (CAYENNE PEPPER PO) Take 1 tablet by mouth daily.    [provider]  Dulaglutide (TRULICITY) 3 MG/0.5ML SOPN Inject 3 mg as directed once a week. Sundays 02/17/23   Smitty Cords, DO  gabapentin (NEURONTIN) 300 MG capsule Take 300 mg by mouth 2 (two)  times daily. 04/17/22   [provider]  lisinopril (ZESTRIL) 5 MG tablet Take 1 tablet by mouth once daily Patient taking differently: Take 5 mg by mouth every morning. 09/08/22   Karamalegos, Netta Neat, DO  meloxicam (MOBIC) 15 MG tablet Take 15 mg by mouth daily. 01/01/23   [provider]  metFORMIN (GLUCOPHAGE-XR) 500 MG 24 hr tablet Take 1 tablet (500 mg total) by mouth 2 (two) times daily with a meal. 09/24/22   Karamalegos, Netta Neat, DO  methocarbamol (ROBAXIN) 500 MG tablet Take 1 tablet (500 mg total) by mouth every 6 (six) hours as needed for muscle spasms. 12/12/22   Susanne Borders, PA  ondansetron (ZOFRAN) 4 MG tablet Take 1 tablet (4 mg total) by mouth every 6 (six) hours as needed for nausea or vomiting. 12/12/22   Susanne Borders, PA  rosuvastatin (CRESTOR) 20 MG tablet TAKE 1 TABLET BY MOUTH AT BEDTIME 09/08/22   Karamalegos, Netta Neat, DO  senna (SENOKOT) 8.6 MG TABS tablet Take 1 tablet (8.6 mg total) by mouth 2 (two) times daily. 12/12/22   Susanne Borders, PA  sertraline (ZOLOFT) 100 MG tablet TAKE ONE AND ONE-HALF TABLETS BY MOUTH AT BEDTIME 02/23/23   Smitty Cords, DO    DATA REVIEWED    Imaging Studies  03/12/23 MRI L spine  IMPRESSION: 1. Interval L4-5 fusion with significantly improved canal and foraminal patency. 2. 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     Electronically Signed  By: Tiburcio Pea M.D.   On: 03/18/2023 04:02   IMPRESSION  Ms. Rougeau is a 62 y.o. female who I performed a telephone encounter today for evaluation and management of right hip and groin pain  PLAN  Marrian Gemmill is a pleasant 62 year old presenting via telephone visit after review of her MRI.  Her lumbar MRI is reassuring.  She does not have any significant ongoing compression.  We discussed that her symptoms may be related to an underlying hip pathology and I recommended further  evaluation with orthopedics.  She would like to go to [facility] for this.  I have placed a referral.  We also discussed physical therapy however she would like to hold off on this until she is evaluated by Ortho.  Given that her symptoms have worsened after a fall on May 8 and her most recent x-rays predate this, we will update her x-rays however I suspect these will be normal.  Talked her with the results of her x-rays.  To maintain her regularly scheduled postoperative visits.  No orders of the defined types were placed in this encounter.   DISPOSITION  Follow up: In person appointment in 3 months with Dr. Vilma Prader, PA   TELEPHONE DOCUMENTATION   This visit was performed via telephone.  Patient location: home Provider location: office  I spent a total of 5 minutes non-face-to-face activities for this visit on the date of this encounter including review of current clinical condition and response to treatment.  The patient is aware of and accepts the limits of this telehealth visit.

## 2023-03-27 ENCOUNTER — Ambulatory Visit: Payer: Commercial Managed Care - PPO | Admitting: Internal Medicine

## 2023-03-27 ENCOUNTER — Encounter: Payer: Self-pay | Admitting: Internal Medicine

## 2023-03-27 ENCOUNTER — Ambulatory Visit
Admission: RE | Admit: 2023-03-27 | Discharge: 2023-03-27 | Disposition: A | Discharge status: Home / Self Care | Payer: Commercial Managed Care - PPO | Source: Ambulatory Visit | Attending: Neurosurgery | Admitting: Neurosurgery

## 2023-03-27 ENCOUNTER — Ambulatory Visit
Admission: RE | Admit: 2023-03-27 | Discharge: 2023-03-27 | Disposition: A | Discharge status: Home / Self Care | Payer: Commercial Managed Care - PPO | Attending: Neurosurgery | Admitting: Neurosurgery

## 2023-03-27 VITALS — BP 112/68 | HR 82 | Temp 96.4°F | Wt 173.0 lb

## 2023-03-27 DIAGNOSIS — F3342 Major depressive disorder, recurrent, in full remission: Secondary | ICD-10-CM

## 2023-03-27 DIAGNOSIS — M5441 Lumbago with sciatica, right side: Secondary | ICD-10-CM | POA: Insufficient documentation

## 2023-03-27 DIAGNOSIS — R112 Nausea with vomiting, unspecified: Secondary | ICD-10-CM | POA: Diagnosis not present

## 2023-03-27 DIAGNOSIS — M5416 Radiculopathy, lumbar region: Secondary | ICD-10-CM | POA: Diagnosis present

## 2023-03-27 DIAGNOSIS — F4321 Adjustment disorder with depressed mood: Secondary | ICD-10-CM | POA: Diagnosis not present

## 2023-03-27 DIAGNOSIS — G8929 Other chronic pain: Secondary | ICD-10-CM | POA: Diagnosis present

## 2023-03-27 DIAGNOSIS — R1013 Epigastric pain: Secondary | ICD-10-CM

## 2023-03-27 LAB — LAB REPORT - SCANNED
A1c: 7.5
EGFR: 88

## 2023-03-27 MED ORDER — ONDANSETRON 4 MG PO TBDP: 4.0000 mg | ORAL_TABLET | Freq: Three times a day (TID) | ORAL | 0 refills | Status: AC | PRN

## 2023-03-27 MED ORDER — OMEPRAZOLE 20 MG PO CPDR: 20.0000 mg | DELAYED_RELEASE_CAPSULE | Freq: Every day | ORAL | 1 refills | Status: DC

## 2023-03-27 MED ORDER — SERTRALINE HCL 100 MG PO TABS: 200.0000 mg | ORAL_TABLET | Freq: Every day | ORAL | 0 refills | Status: DC

## 2023-03-27 NOTE — Patient Instructions (Signed)
Vomiting, Adult Vomiting is when stomach contents forcefully come out of the mouth. Many people notice nausea before vomiting. Vomiting can make you feel weak and cause you to become dehydrated. Dehydration can make you feel tired and thirsty, cause you to have a dry mouth, and decrease how often you urinate. Older adults and people who have other diseases or a weak body defense system (immune system) are at higher risk for dehydration. It is important to treat vomiting as told by your health care provider. Follow these instructions at home:  Watch your symptoms for any changes. Tell your health care provider about them. Eating and drinking     Follow these recommendations as told by your health care provider: Take an oral rehydration solution (ORS). This is a drink that is sold at pharmacies and retail stores. Eat bland, easy-to-digest foods in small amounts as you are able. These foods include bananas, applesauce, rice, lean meats, toast, and crackers. Drink clear fluids slowly and in small amounts as you are able. Clear fluids include water, ice chips, low-calorie sports drinks, and fruit juice that has water added (diluted fruit juice). Avoid drinking fluids that contain a lot of sugar or caffeine, such as energy drinks, sports drinks, and soda. Avoid alcohol. Avoid spicy or fatty foods.  General instructions Wash your hands often using soap and water for at least 20 seconds. If soap and water are not available, use hand sanitizer. Make sure that everyone in your household washes their hands frequently. Take over-the-counter and prescription medicines only as told by your health care provider. Rest at home while you recover. Watch your condition for any changes. Keep all follow-up visits. This is important. Contact a health care provider if: Your vomiting gets worse. You have new symptoms. You have a fever. You cannot drink fluids without vomiting. You feel light-headed or  dizzy. You have a headache. You have muscle cramps. You have a rash. You have pain while urinating. Get help right away if: You have pain in your chest, neck, arm, or jaw. Your heart is beating very quickly. You have trouble breathing or you are breathing very quickly. You feel extremely weak or you faint. Your skin feels cold and clammy. You feel confused. You have persistent vomiting. You have vomit that is bright red or looks like black coffee grounds. You have stools (feces) that are bloody or black, or stools that look like tar. You have a severe headache, a stiff neck, or both. You have severe pain, cramping, or bloating in your abdomen. You have signs of dehydration, such as: Dark urine, very little urine, or no urine. Cracked lips. Dry mouth. Sunken eyes. Sleepiness. Weakness. These symptoms may be an emergency. Get help right away. Call 911. Do not wait to see if the symptoms will go away. Do not drive yourself to the hospital. Summary Vomiting is when stomach contents forcefully come out of the mouth. Vomiting can cause you to become dehydrated. It is important to treat vomiting as told by your health care provider. Follow your health care provider's instructions about eating and drinking. Wash your hands often using soap and water for at least 20 seconds. If soap and water are not available, use hand sanitizer. Watch your condition for any changes and for signs of dehydration. Keep all follow-up visits. This is important. This information is not intended to replace advice given to you by your health care provider. Make sure you discuss any questions you have with your health care provider.  Document Revised: 04/19/2021 Document Reviewed: 04/19/2021 Elsevier Patient Education  2024 [employer].

## 2023-03-27 NOTE — Progress Notes (Signed)
Subjective:    Patient ID: Andrea Mcdonald, female    DOB: [birth year], 62 y.o.   MRN: [id number]  HPI  Patient presents to clinic today with complaint of fatigue, intermittent headache, poor appetite, nausea and vomiting.  This started around 2 weeks ago after the passing of her mother (this was not expected, very sudden). She reports if she vomits, she will vomit 1-2 times per day. She denies abdominal pain, diarrhea, constipation or blood in her stool. She denies recent changes in diet or medications. She has been trying to consume a bland diet.  She has a history of DM2, managed on Metformin and Trulicity. She has not been checking her blood sugars routinely 140-160 in the last few days. She is on Sertraline for management of anxiety and depression. She reports she is sleeping a lot during the day. She lives alone. She is not currently doing any grief counseling.  Review of Systems     Past Medical History:  Diagnosis Date   Anxiety    Arthritis    Chronic venous insufficiency    Depression    High cholesterol    History of kidney stones    Hypertension    Plantar fasciitis, bilateral 05/25/2013   PONV (postoperative nausea and vomiting)    n/v x1 after 09-2022 shoulder surgery   RLS (restless legs syndrome)    Type 2 diabetes mellitus (HCC)    Vitamin D deficiency     Current Outpatient Medications  Medication Sig Dispense Refill   Capsicum, Cayenne, (CAYENNE PEPPER PO) Take 1 tablet by mouth daily.     Dulaglutide (TRULICITY) 3 MG/0.5ML SOPN Inject 3 mg as directed once a week. Sundays 6 mL 0   gabapentin (NEURONTIN) 300 MG capsule Take 300 mg by mouth 2 (two) times daily.     lisinopril (ZESTRIL) 5 MG tablet Take 1 tablet by mouth once daily (Patient taking differently: Take 5 mg by mouth every morning.) 90 tablet 1   meloxicam (MOBIC) 15 MG tablet Take 15 mg by mouth daily.     metFORMIN (GLUCOPHAGE-XR) 500 MG 24 hr tablet Take 1 tablet (500 mg total) by mouth 2 (two)  times daily with a meal. 180 tablet 3   methocarbamol (ROBAXIN) 500 MG tablet Take 1 tablet (500 mg total) by mouth every 6 (six) hours as needed for muscle spasms. 120 tablet 0   ondansetron (ZOFRAN) 4 MG tablet Take 1 tablet (4 mg total) by mouth every 6 (six) hours as needed for nausea or vomiting. 20 tablet 0   rosuvastatin (CRESTOR) 20 MG tablet TAKE 1 TABLET BY MOUTH AT BEDTIME 90 tablet 3   senna (SENOKOT) 8.6 MG TABS tablet Take 1 tablet (8.6 mg total) by mouth 2 (two) times daily. 120 tablet 0   sertraline (ZOLOFT) 100 MG tablet TAKE ONE AND ONE-HALF TABLETS BY MOUTH AT BEDTIME 135 tablet 0   No current facility-administered medications for this visit.    Allergies  Allergen Reactions   Codeine Itching    Family History  Problem Relation Age of Onset   Heart disease Mother    Depression Mother    Anxiety disorder Mother    Cancer Mother        retinal   Breast cancer Neg Hx     Social History   Socioeconomic History   Marital status: Married    Spouse name: Not on file   Number of children: Not on file   Years of education: Not on  file   Highest education level: Not on file  Occupational History   Not on file  Tobacco Use   Smoking status: Never   Smokeless tobacco: Never  Vaping Use   Vaping Use: Never used  Substance and Sexual Activity   Alcohol use: Yes    Comment: drink occ   Drug use: No   Sexual activity: Yes  Other Topics Concern   Not on file  Social History Narrative   Not on file   Social Determinants of Health   Financial Resource Strain: Not on file  Food Insecurity: No Food Insecurity (12/10/2022)   Hunger Vital Sign    Worried About Running Out of Food in the Last Year: Never true    Ran Out of Food in the Last Year: Never true  Transportation Needs: No Transportation Needs (12/10/2022)   PRAPARE - Administrator, Civil Service (Medical): No    Lack of Transportation (Non-Medical): No  Physical Activity: Not on file   Stress: Not on file  Social Connections: Not on file  Intimate Partner Violence: Not At Risk (12/10/2022)   Humiliation, Afraid, Rape, and Kick questionnaire    Fear of Current or Ex-Partner: No    Emotionally Abused: No    Physically Abused: No    Sexually Abused: No     Constitutional: Patient reports fatigue.  Denies fever, malaise, headache or abrupt weight changes.  HEENT: Denies eye pain, eye redness, ear pain, ringing in the ears, wax buildup, runny nose, nasal congestion, bloody nose, or sore throat. Respiratory: Denies difficulty breathing, shortness of breath, cough or sputum production.   Cardiovascular: Denies chest pain, chest tightness, palpitations or swelling in the hands or feet.  Gastrointestinal: Patient reports nausea and vomiting.  Denies abdominal pain, bloating, constipation, diarrhea or blood in the stool.  GU: Denies urgency, frequency, pain with urination, burning sensation, blood in urine, odor or discharge. Musculoskeletal: Patient reports chronic back pain.  Denies decrease in range of motion, difficulty with gait, muscle pain or joint swelling.  Skin: Denies redness, rashes, lesions or ulcercations.  Neurological: Denies dizziness, difficulty with memory, difficulty with speech or problems with balance and coordination.  Psych: Patient has a history of anxiety and depression.  Denies SI/HI.  No other specific complaints in a complete review of systems (except as listed in HPI above).  Objective:   Physical Exam   BP 112/68 (BP Location: Left Arm, Patient Position: Sitting, Cuff Size: Normal)   Pulse 82   Temp (!) 96.4 F (35.8 C) (Temporal)   Wt 173 lb (78.5 kg)   SpO2 98%   BMI 28.79 kg/m   Wt Readings from Last 3 Encounters:  03/03/23 168 lb (76.2 kg)  01/22/23 168 lb 9.6 oz (76.5 kg)  12/25/22 168 lb 9.6 oz (76.5 kg)    General: Appears her stated age, overweight, in NAD. Skin: Warm, dry and intact.  HEENT: Head: normal shape and size;  Eyes: sclera white, no icterus, conjunctiva pink, PERRLA and EOMs intact;  Neck:  Neck supple, trachea midline. No masses, lumps or thyromegaly present.  Cardiovascular: Normal rate and rhythm. S1,S2 noted.  No murmur, rubs or gallops noted.  Pulmonary/Chest: Normal effort and positive vesicular breath sounds. No respiratory distress. No wheezes, rales or ronchi noted.  Abdomen: Soft and tender in epigastric region.  Normal bowel sounds.  Musculoskeletal:  No difficulty with gait.  Neurological: Alert and oriented. Coordination normal.  Psychiatric: Mood and affect mildly flat. Behavior is normal.  Judgment and thought content normal.    BMET    Component Value Date/Time   NA 140 11/27/2022 1022   NA 144 12/11/2015 1428   NA 139 10/29/2014 1910   K 4.4 11/27/2022 1022   K 4.5 10/29/2014 1910   CL 104 11/27/2022 1022   CL 102 10/29/2014 1910   CO2 28 11/27/2022 1022   CO2 32 10/29/2014 1910   GLUCOSE 141 (H) 11/27/2022 1022   GLUCOSE 169 (H) 10/29/2014 1910   BUN 11 11/27/2022 1022   BUN 15 12/11/2015 1428   BUN 16 10/29/2014 1910   CREATININE 0.74 11/27/2022 1022   CALCIUM 9.9 11/27/2022 1022   CALCIUM 9.7 10/29/2014 1910   GFRNONAA >60 08/18/2022 0906   GFRNONAA 75 07/23/2020 1059   GFRAA 87 07/23/2020 1059    Lipid Panel     Component Value Date/Time   CHOL 120 08/26/2021 1527   CHOL 126 03/20/2016 1123   TRIG 76 08/26/2021 1527   HDL 62 08/26/2021 1527   HDL 58 03/20/2016 1123   CHOLHDL 1.9 08/26/2021 1527   VLDL 51 (H) 07/08/2016 1559   LDLCALC 42 08/26/2021 1527    CBC    Component Value Date/Time   WBC 5.3 11/27/2022 1022   RBC 4.05 11/27/2022 1022   HGB 12.2 11/27/2022 1022   HGB 12.2 12/11/2015 1428   HCT 36.5 11/27/2022 1022   HCT 36.4 12/11/2015 1428   PLT 171 11/27/2022 1022   PLT 210 12/11/2015 1428   MCV 90.1 11/27/2022 1022   MCV 85 12/11/2015 1428   MCV 88 10/29/2014 1910   MCH 30.1 11/27/2022 1022   MCHC 33.4 11/27/2022 1022   RDW 12.4  11/27/2022 1022   RDW 14.0 12/11/2015 1428   RDW 13.6 10/29/2014 1910   LYMPHSABS 1,606 11/27/2022 1022   LYMPHSABS 1.4 12/11/2015 1428   EOSABS 80 11/27/2022 1022   EOSABS 0.0 12/11/2015 1428   BASOSABS 32 11/27/2022 1022   BASOSABS 0.0 12/11/2015 1428    Hgb A1C Lab Results  Component Value Date   HGBA1C 7.5 (H) 11/25/2022           Assessment & Plan:   Fatigue, Nausea, Vomiting, Epigastric Pain, Grief Reaction:  I suspect that the stress is causing reflux type symptoms versus gastritis We will check CBC, c-Met and TSH today Rx for omeprazole 20 mg daily Rx for Zofran 4 mg every 8 hours as needed Increase Sertraline to 200 mg daily Support offered  Follow-up with your PCP as previously scheduled Nicki Reaper, NP

## 2023-03-28 LAB — CBC
HCT: 35.4 % (ref 35.0–45.0)
Hemoglobin: 11.7 g/dL (ref 11.7–15.5)
MCH: 28.6 pg (ref 27.0–33.0)
MCHC: 33.1 g/dL (ref 32.0–36.0)
MCV: 86.6 fL (ref 80.0–100.0)
MPV: 10.9 fL (ref 7.5–12.5)
Platelets: 190 10*3/uL (ref 140–400)
RBC: 4.09 10*6/uL (ref 3.80–5.10)
RDW: 13.6 % (ref 11.0–15.0)
WBC: 5.8 10*3/uL (ref 3.8–10.8)

## 2023-03-28 LAB — TSH: TSH: 1.62 mIU/L (ref 0.40–4.50)

## 2023-03-28 LAB — COMPLETE METABOLIC PANEL WITH GFR
AG Ratio: 2.1 (calc) (ref 1.0–2.5)
ALT: 19 U/L (ref 6–29)
AST: 13 U/L (ref 10–35)
Albumin: 4.5 g/dL (ref 3.6–5.1)
Alkaline phosphatase (APISO): 63 U/L (ref 37–153)
BUN: 13 mg/dL (ref 7–25)
CO2: 27 mmol/L (ref 20–32)
Calcium: 9.7 mg/dL (ref 8.6–10.4)
Chloride: 107 mmol/L (ref 98–110)
Creat: 0.78 mg/dL (ref 0.50–1.05)
Globulin: 2.1 g/dL (calc) (ref 1.9–3.7)
Glucose, Bld: 186 mg/dL — ABNORMAL HIGH (ref 65–139)
Potassium: 3.8 mmol/L (ref 3.5–5.3)
Sodium: 141 mmol/L (ref 135–146)
Total Bilirubin: 0.9 mg/dL (ref 0.2–1.2)
Total Protein: 6.6 g/dL (ref 6.1–8.1)
eGFR: 86 mL/min/{1.73_m2} (ref >=60)

## 2023-04-02 ENCOUNTER — Ambulatory Visit (INDEPENDENT_AMBULATORY_CARE_PROVIDER_SITE_OTHER): Payer: Commercial Managed Care - PPO | Admitting: Neurosurgery

## 2023-04-02 ENCOUNTER — Telehealth: Payer: Self-pay

## 2023-04-02 ENCOUNTER — Telehealth: Payer: Commercial Managed Care - PPO | Admitting: Neurosurgery

## 2023-04-02 DIAGNOSIS — M25551 Pain in right hip: Secondary | ICD-10-CM

## 2023-04-02 NOTE — Progress Notes (Signed)
I called Ms. Melvin today to review her x-ray results (see below). She continues to have groin pain.  She has not been scheduled with orthopedics yet. Lumbar xrays IMPRESSION: 1. Status post posterior fixation of L4-5 without evidence of hardware complication. 2. No acute fracture or subluxation of the lumbar spine. If persistent clinical concern, recommend dedicated cross-sectional imaging.     Electronically Signed   By: Meda Klinefelter M.D.   On: 04/01/2023 16:39  We will see her for regular scheduled follow-up.  She was encouraged to call our office if she has any questions or concerns in the interim.  Manning Charity PA-C Neurosurgery

## 2023-04-02 NOTE — Telephone Encounter (Signed)
Copied from CRM [id number]. Topic: General - Other >> Apr 02, 2023  9:27 AM Macon Large wrote: Reason for CRM: Pt requests call back to go over her most recent lab results. Cb# [phone]    The pt was notified of her lab results. She verbalize understanding, no questions or concerns.

## 2023-05-07 ENCOUNTER — Telehealth: Payer: Self-pay | Admitting: Family Medicine

## 2023-05-07 NOTE — Telephone Encounter (Signed)
The patient called in stating her Dulaglutide (TRULICITY) 3 MG/0.5ML SOPN is on backorder and the pharmacy is not sure when they will get it in. She is due for her next injection on Sunday and is not sure what to do at this point. Please assist patient further as she uses   [facility] [address]), [place] - [address] Phone: [phone]  Fax: [phone]

## 2023-05-08 ENCOUNTER — Ambulatory Visit: Payer: Self-pay

## 2023-05-08 ENCOUNTER — Telehealth: Payer: Self-pay

## 2023-05-08 NOTE — Telephone Encounter (Signed)
We could probably give her a sample if we have any available.  The other option would be to call around to a different pharmacy to see if they have any of this in stock and we can send it there.

## 2023-05-08 NOTE — Telephone Encounter (Signed)
Pt called in and asked the strength level of Trulicity, I gave her the info of 3mg ., so she can give this info to the pharmacy

## 2023-05-08 NOTE — Telephone Encounter (Signed)
Copied from CRM [id number]. Topic: General - Other >> May 08, 2023  9:22 AM Phill Myron wrote: Reason for CRM: PLease call Andrea Mcdonald regarding her medication: Dulaglutide (TRULICITY) 3 MG/0.5ML SOPN,  she has called other pharmacies and to no avail.  She ask is there an alternative medications she can take .  She states she is due to take this medication Sunday

## 2023-05-08 NOTE — Telephone Encounter (Signed)
Chief Complaint: Medication question Symptoms: None Disposition: [] ED /[] Urgent Care (no appt availability in office) / [] Appointment(In office/virtual)/ []  [facility]/ [] Home Care/ [] Refused Recommended Disposition /[] [facility]/ [x]  Follow-up with PCP Additional Notes:   Patient unable to get medication because it is unavailable at local pharmacies. [facility], [facility] in [place] and [place] and [facility] all stated that it is out of stock. Patient  wants to know if provider has an alternate medication that can be order before next dose is due on Sunday? Patient also stated if provider has samples in the office she would be willing come by the office to pick-up. Advised patient that I would forward the message to provider for recommendations. Summary: rx concern   The patient would like to speak with a member of clinical staff regarding their upcoming Dulaglutide (TRULICITY) 3 MG/0.5ML SOPN [[id number]] injection which is due on 05/10/23  The patient shares that they are concerned with the effects of going without the medication due to being unable to find it at their pharmacy  Please contact the patient further when available       Reason for Disposition  [1] Caller has NON-URGENT medicine question about med that PCP prescribed AND [2] triager unable to answer question  Answer Assessment - Initial Assessment Questions 1. NAME of MEDICINE: "What medicine(s) are you calling about?"     Dulaglutide (Trulicity) 3mg /0.51ml injection 2. QUESTION: "What is your question?" (e.g., double dose of medicine, side effect)     Patient unable to get medication because unavailable. [facility], [facility] in [place] and [place] and [facility] all stated that it is out of stock. Patient  wants to know if provider has an alternate medication that can be order before next dose is due on Sunday?  3. PRESCRIBER: "Who prescribed the medicine?" Reason: if prescribed by specialist, call should be referred to  that group.     Dr. Althea Charon 4. SYMPTOMS: "Do you have any symptoms?" If Yes, ask: "What symptoms are you having?"  "How bad are the symptoms (e.g., mild, moderate, severe)     No  Protocols used: Medication Question Call-A-AH

## 2023-05-11 ENCOUNTER — Telehealth: Payer: Self-pay

## 2023-05-11 MED ORDER — OZEMPIC (0.25 OR 0.5 MG/DOSE) 2 MG/3ML ~~LOC~~ SOPN: 0.5000 mg | PEN_INJECTOR | SUBCUTANEOUS | 0 refills | Status: DC

## 2023-05-11 NOTE — Telephone Encounter (Signed)
Ozempic sent to [facility] in place of Trulicity

## 2023-05-11 NOTE — Addendum Note (Signed)
Addended by: Lorre Munroe on: 05/11/2023 11:01 AM   Modules accepted: Orders

## 2023-05-11 NOTE — Telephone Encounter (Signed)
See telephone encounter   Thanks,   -Laura  

## 2023-05-11 NOTE — Telephone Encounter (Signed)
Duplicate message. 

## 2023-05-11 NOTE — Telephone Encounter (Signed)
Copied from CRM [id number]. Topic: General - Inquiry >> May 11, 2023  9:29 AM Clide Dales wrote: Patient states that she is having a hard time finding Trulicity in stock and would like to know if there is something else she can be given. Patient is currently out of medication. Please advise. >> May 11, 2023  9:46 AM Lennox Pippins wrote: Patient called back and [facility] on [address] and asked about Ozempic and pharmacy stated they do have Ozempic there and patient was wondering if this is compatible to Trulity & call in a RX for the Ozempic for 3 months if it is. Patient stated she was due for her injection yesterday and wants this expedited.   [phone] -- Patient callback #

## 2023-05-12 ENCOUNTER — Ambulatory Visit: Payer: Self-pay

## 2023-05-12 ENCOUNTER — Telehealth: Payer: Self-pay | Admitting: Family Medicine

## 2023-05-12 NOTE — Telephone Encounter (Signed)
  Chief Complaint: how to take Ozempic Symptoms: n/a Frequency: n/a  Disposition: [] ED /[] Urgent Care (no appt availability in office) / [] Appointment(In office/virtual)/ []  [place] [facility]/ [x] Home Care/ [] Refused Recommended Disposition /[] [place] [facility]/ []  Follow-up with PCP Additional Notes: pt stated she does not know how to take prescribed dose of Ozempic. Pt staed her dose was 9 mg. Advised pt that is incorrect and clarified her dose is 0.5 mg per dose. Assisted pt to dial up to 0.5 mg and explained that is her dose every week. Advised 1 pen will last x 1 month. Verified concentration of pen 2 mg/3 ml. Pt stated the pen is a sample because waiting on insurance co. To give PA. Pt stated she understands how to give 0.5 mg and verbalized back that her dose is 0.5 mg not 9 mg. Pt stated she was told that was her dose from "some lady" Message from [place] B sent at 05/12/2023  1:39 PM EDT  Summary: how to use Ozempic pen   Pt called in has questions on how to use Ozempic pen. She was asking does she need to dial it up to 3         Reason for Disposition  Caller has medicine question only, adult not sick, AND triager answers question  Answer Assessment - Initial Assessment Questions 1. NAME of MEDICINE: "What medicine(s) are you calling about?"     Ozempic 2. QUESTION: "What is your question?" (e.g., double dose of medicine, side effect)     How to take  3. PRESCRIBER: "Who prescribed the medicine?" Reason: if prescribed by specialist, call should be referred to that group.     Sample from office R. Baity NP  Protocols used: Medication Question Call-A-AH

## 2023-05-19 ENCOUNTER — Other Ambulatory Visit: Payer: Self-pay | Admitting: Family Medicine

## 2023-05-19 DIAGNOSIS — F3342 Major depressive disorder, recurrent, in full remission: Secondary | ICD-10-CM

## 2023-05-19 LAB — HM DIABETES EYE EXAM

## 2023-05-20 NOTE — Telephone Encounter (Signed)
Requested Prescriptions  Pending Prescriptions Disp Refills   sertraline (ZOLOFT) 100 MG tablet [Pharmacy Med Name: Sertraline HCl 100 MG Oral Tablet] 135 tablet 0    Sig: TAKE 1 & 1/2 (ONE & ONE-HALF) TABLETS BY MOUTH AT BEDTIME     Psychiatry:  Antidepressants - SSRI - sertraline Passed - 05/19/2023  9:52 AM      Passed - AST in normal range and within 360 days    AST  Date Value Ref Range Status  03/27/2023 13 10 - 35 U/L Final   SGOT(AST)  Date Value Ref Range Status  10/29/2014 20 15 - 37 Unit/L Final         Passed - ALT in normal range and within 360 days    ALT  Date Value Ref Range Status  03/27/2023 19 6 - 29 U/L Final   SGPT (ALT)  Date Value Ref Range Status  10/29/2014 27 U/L Final    Comment:    14-63 NOTE: New Reference Range 05/16/14          Passed - Completed PHQ-2 or PHQ-9 in the last 360 days      Passed - Valid encounter within last 6 months    Recent Outpatient Visits           1 month ago Nausea and vomiting, unspecified vomiting type   [place] [facility] [place], Salvadore [place], NP   5 months ago Pre-op examination   [place] [facility] [place], Netta Neat, DO   7 months ago Type 2 diabetes mellitus with other specified complication, without long-term current use of insulin [facility])   [place] [facility] [place], Netta Neat, DO   8 months ago Type 2 diabetes mellitus with other specified complication, without long-term current use of insulin [facility])   [place] [facility] [place]um, Oswaldo Conroy, [place]   1 year ago Gross hematuria    [facility] [place], Netta Neat, [place]

## 2023-06-01 ENCOUNTER — Other Ambulatory Visit: Payer: Self-pay

## 2023-06-01 DIAGNOSIS — M5416 Radiculopathy, lumbar region: Secondary | ICD-10-CM

## 2023-06-11 ENCOUNTER — Ambulatory Visit: Payer: Commercial Managed Care - PPO | Admitting: Neurosurgery

## 2023-06-11 ENCOUNTER — Encounter: Payer: Self-pay | Admitting: Neurosurgery

## 2023-06-11 ENCOUNTER — Ambulatory Visit
Admission: RE | Admit: 2023-06-11 | Discharge: 2023-06-11 | Disposition: A | Discharge status: Home / Self Care | Payer: Commercial Managed Care - PPO | Attending: Neurosurgery | Admitting: Neurosurgery

## 2023-06-11 ENCOUNTER — Ambulatory Visit
Admission: RE | Admit: 2023-06-11 | Discharge: 2023-06-11 | Disposition: A | Discharge status: Home / Self Care | Payer: Commercial Managed Care - PPO | Source: Ambulatory Visit | Attending: Neurosurgery | Admitting: Neurosurgery

## 2023-06-11 VITALS — BP 116/76 | Ht 65.0 in | Wt 173.0 lb

## 2023-06-11 DIAGNOSIS — M5416 Radiculopathy, lumbar region: Secondary | ICD-10-CM | POA: Diagnosis not present

## 2023-06-11 DIAGNOSIS — M48062 Spinal stenosis, lumbar region with neurogenic claudication: Secondary | ICD-10-CM

## 2023-06-11 DIAGNOSIS — M4316 Spondylolisthesis, lumbar region: Secondary | ICD-10-CM

## 2023-06-11 DIAGNOSIS — Z981 Arthrodesis status: Secondary | ICD-10-CM | POA: Diagnosis not present

## 2023-06-11 DIAGNOSIS — Z09 Encounter for follow-up examination after completed treatment for conditions other than malignant neoplasm: Secondary | ICD-10-CM | POA: Diagnosis not present

## 2023-06-11 NOTE — Progress Notes (Signed)
   REFERRING PHYSICIAN:  Kasandra Knudsen [address] [place],  [place] [address]  DOS: 12/10/22 L4-5 XLIF   HISTORY OF PRESENT ILLNESS: Andrea Mcdonald is status post lumbar fusion. she is doing well with complete resolution of her preoperative leg pain.    She had some pain into her right groin and had a successful injection into her hip with Dr. Landry Mellow.  She would like to try this again.  PHYSICAL EXAMINATION:  General: Patient is well developed, well nourished, calm, collected, and in no apparent distress.   NEUROLOGICAL:  General: In no acute distress.     Strength:            Side Iliopsoas Quads Hamstring PF DF EHL  R 5 5 5 5 5 5   L 5 5 5 5 5 5    Incision c/d/i   ROS (Neurologic):  Negative except as noted above  IMAGING: No complications noted  ASSESSMENT/PLAN:  Andrea Mcdonald is doing well after lumbar fusion.  She is doing well.  I will see her back on an as-needed basis.  I am very pleased with her improvements after surgery.  I will send her back to see Dr. Landry Mellow.  Venetia Night MD Department of neurosurgery

## 2023-06-30 ENCOUNTER — Other Ambulatory Visit: Payer: Self-pay | Admitting: Family Medicine

## 2023-06-30 ENCOUNTER — Encounter: Payer: Self-pay | Admitting: Family Medicine

## 2023-06-30 DIAGNOSIS — Z1231 Encounter for screening mammogram for malignant neoplasm of breast: Secondary | ICD-10-CM

## 2023-07-08 ENCOUNTER — Other Ambulatory Visit: Payer: Self-pay | Admitting: Family Medicine

## 2023-07-08 ENCOUNTER — Encounter: Payer: Self-pay | Admitting: Family Medicine

## 2023-07-08 ENCOUNTER — Ambulatory Visit
Admission: RE | Admit: 2023-07-08 | Discharge: 2023-07-08 | Disposition: A | Discharge status: Home / Self Care | Payer: Commercial Managed Care - PPO | Source: Ambulatory Visit | Attending: Family Medicine | Admitting: Family Medicine

## 2023-07-08 DIAGNOSIS — Z1231 Encounter for screening mammogram for malignant neoplasm of breast: Secondary | ICD-10-CM | POA: Insufficient documentation

## 2023-07-16 ENCOUNTER — Other Ambulatory Visit: Payer: Self-pay | Admitting: Family Medicine

## 2023-07-16 NOTE — Telephone Encounter (Signed)
Medication Refill - Medication: OZEMPIC, 0.25 OR 0.5 MG/DOSE, 2 MG/3ML SOPN   Has the patient contacted their pharmacy? Yes.   No refills  Preferred Pharmacy (with phone number or street name):  [facility] [address] [place]), [place] - [address] Phone: [phone]  Fax: [phone]     Has the patient been seen for an appointment in the last year OR does the patient have an upcoming appointment? Yes.    Agent: Please be advised that RX refills may take up to 3 business days. We ask that you follow-up with your pharmacy.

## 2023-07-17 MED ORDER — OZEMPIC (0.25 OR 0.5 MG/DOSE) 2 MG/3ML ~~LOC~~ SOPN: 0.5000 mg | PEN_INJECTOR | SUBCUTANEOUS | 0 refills | Status: DC

## 2023-07-17 NOTE — Telephone Encounter (Signed)
Requested Prescriptions  Pending Prescriptions Disp Refills   OZEMPIC, 0.25 OR 0.5 MG/DOSE, 2 MG/3ML SOPN 9 mL 0    Sig: Inject 0.5 mg into the skin once a week.     Endocrinology:  Diabetes - GLP-1 Receptor Agonists - semaglutide Failed - 07/16/2023 11:15 AM      Failed - HBA1C in normal range and within 180 days    Hgb A1c MFr Bld  Date Value Ref Range Status  11/25/2022 7.5 (H) 4.8 - 5.6 % Final    Comment:    (NOTE) Pre diabetes:          5.7%-6.4%  Diabetes:              >6.4%  Glycemic control for   <7.0% adults with diabetes          Passed - Cr in normal range and within 360 days    Creat  Date Value Ref Range Status  03/27/2023 0.78 0.50 - 1.05 mg/dL Final   Creatinine, Urine  Date Value Ref Range Status  08/25/2022 246 20 - 275 mg/dL Final         Passed - Valid encounter within last 6 months    Recent Outpatient Visits           3 months ago Nausea and vomiting, unspecified vomiting type   [place] [facility] [place], Salvadore [place], NP   7 months ago Pre-op examination   [place] [facility] Smitty Cords, DO   9 months ago Type 2 diabetes mellitus with other specified complication, without long-term current use of insulin [facility])   [facility] [place], Netta Neat, DO   10 months ago Type 2 diabetes mellitus with other specified complication, without long-term current use of insulin [facility])   [place] [facility] Mecum, Oswaldo Conroy, [place]   1 year ago Gross hematuria   [place] [facility] [place], Netta Neat, [place]

## 2023-07-29 ENCOUNTER — Ambulatory Visit: Payer: Self-pay

## 2023-07-29 NOTE — Telephone Encounter (Signed)
I spoke to [place] and relayed Dr. Charm Rings message that she can restart the Lisinopril as she was prior to surgery. She verbalized understanding.

## 2023-07-29 NOTE — Telephone Encounter (Signed)
Summary: Medication question   Pt wants to know if she needs to either stop or continue taking lisinopril, she was instructed to stop taking it while she was in the hospital for recent surgery.       Chief Complaint: Had shoulder surgery last week and they "stopped my Lisinopril because my BP was low.Should I start it back?" Does not currently have transportation for OV. Has a home cuff and will check BP.  Symptoms: None Frequency: Last week Pertinent Negatives: Patient denies symptoms Disposition: [] ED /[] Urgent Care (no appt availability in office) / [] Appointment(In office/virtual)/ []  [place] [facility]/ [] Home Care/ [] Refused Recommended Disposition /[] [place] [facility]/ [x]  Follow-up with PCP Additional Notes: Please advise pt.  Reason for Disposition  [1] Caller has URGENT medicine question about med that PCP or specialist prescribed AND [2] triager unable to answer question  Answer Assessment - Initial Assessment Questions 1. NAME of MEDICINE: "What medicine(s) are you calling about?"     Lisinopril was stopped while she was in [facility] for shoulder surgery. "My blood pressure was low." Has a cuff at home and will check it.  2. QUESTION: "What is your question?" (e.g., double dose of medicine, side effect)     Asking if she should restart medication. 3. PRESCRIBER: "Who prescribed the medicine?" Reason: if prescribed by specialist, call should be referred to that group.     Dr. Kirtland Bouchard 4. SYMPTOMS: "Do you have any symptoms?" If Yes, ask: "What symptoms are you having?"  "How bad are the symptoms (e.g., mild, moderate, severe)     N/A 5. PREGNANCY:  "Is there any chance that you are pregnant?" "When was your last menstrual period?"     nO  Protocols used: Medication Question Call-A-AH

## 2023-07-29 NOTE — Telephone Encounter (Signed)
She should likely restart the Lisinopril. I have not seen her since February. It is fine to restart it if she normally is on it but they held it temporarily in hospital due to surgery for low BP.  Saralyn Pilar, DO [facility] 07/29/2023, 1:27 PM

## 2023-08-18 ENCOUNTER — Other Ambulatory Visit: Payer: Self-pay | Admitting: Family Medicine

## 2023-08-18 DIAGNOSIS — I10 Essential (primary) hypertension: Secondary | ICD-10-CM

## 2023-08-18 DIAGNOSIS — E1169 Type 2 diabetes mellitus with other specified complication: Secondary | ICD-10-CM

## 2023-08-18 DIAGNOSIS — F3342 Major depressive disorder, recurrent, in full remission: Secondary | ICD-10-CM

## 2023-08-19 NOTE — Telephone Encounter (Signed)
Requested Prescriptions  Pending Prescriptions Disp Refills   sertraline (ZOLOFT) 100 MG tablet [Pharmacy Med Name: Sertraline HCl 100 MG Oral Tablet] 135 tablet 0    Sig: TAKE 1 & 1/2 (ONE & ONE-HALF) TABLETS BY MOUTH AT BEDTIME     Psychiatry:  Antidepressants - SSRI - sertraline Passed - 08/18/2023  9:05 AM      Passed - AST in normal range and within 360 days    AST  Date Value Ref Range Status  03/27/2023 13 10 - 35 U/L Final   SGOT(AST)  Date Value Ref Range Status  10/29/2014 20 15 - 37 Unit/L Final         Passed - ALT in normal range and within 360 days    ALT  Date Value Ref Range Status  03/27/2023 19 6 - 29 U/L Final   SGPT (ALT)  Date Value Ref Range Status  10/29/2014 27 U/L Final    Comment:    14-63 NOTE: New Reference Range 05/16/14          Passed - Completed PHQ-2 or PHQ-9 in the last 360 days      Passed - Valid encounter within last 6 months    Recent Outpatient Visits           4 months ago Nausea and vomiting, unspecified vomiting type   [place] [facility] [place], Salvadore [place], NP   8 months ago Pre-op examination   [place] [facility] Smitty Cords, DO   10 months ago Type 2 diabetes mellitus with other specified complication, without long-term current use of insulin [facility])   [place] [place] [facility] Smitty Cords, DO   11 months ago Type 2 diabetes mellitus with other specified complication, without long-term current use of insulin (HCC)   [place] Mecum, [place], [place]   1 year ago Gross hematuria   [place] [facility] [place], Netta Neat, DO               rosuvastatin (CRESTOR) 20 MG tablet [Pharmacy Med Name: Rosuvastatin Calcium 20 MG Oral Tablet] 90 tablet 0    Sig: TAKE 1 TABLET BY MOUTH AT BEDTIME     Cardiovascular:  Antilipid - Statins 2 Failed - 08/18/2023  9:05 AM      Failed - Lipid Panel in  normal range within the last 12 months    Cholesterol, Total  Date Value Ref Range Status  03/20/2016 126 100 - 199 mg/dL Final   Cholesterol  Date Value Ref Range Status  08/26/2021 120 <200 mg/dL Final   LDL Cholesterol (Calc)  Date Value Ref Range Status  08/26/2021 42 mg/dL (calc) Final    Comment:    Reference range: <100 . Desirable range <100 mg/dL for primary prevention;   <70 mg/dL for patients with CHD or diabetic patients  with > or = 2 CHD risk factors. Marland Kitchen LDL-C is now calculated using the Martin-Hopkins  calculation, which is a validated novel method providing  better accuracy than the Friedewald equation in the  estimation of LDL-C.  Horald Pollen et al. Lenox Ahr. 2951;884(16): 2061-2068  (http://education.Quest[employer].com/faq/FAQ164)    HDL  Date Value Ref Range Status  08/26/2021 62 > OR = 50 mg/dL Final  60/63/0160 58 >10 mg/dL Final   Triglycerides  Date Value Ref Range Status  08/26/2021 76 <150 mg/dL Final         Passed -  Cr in normal range and within 360 days    Creat  Date Value Ref Range Status  03/27/2023 0.78 0.50 - 1.05 mg/dL Final   Creatinine, Urine  Date Value Ref Range Status  08/25/2022 246 20 - 275 mg/dL Final         Passed - Patient is not pregnant      Passed - Valid encounter within last 12 months    Recent Outpatient Visits           4 months ago Nausea and vomiting, unspecified vomiting type   [place] [facility] [place], Salvadore [place], NP   8 months ago Pre-op examination   [place] [facility] Smitty Cords, DO   10 months ago Type 2 diabetes mellitus with other specified complication, without long-term current use of insulin (HCC)   [place] [facility] Smitty Cords, DO   11 months ago Type 2 diabetes mellitus with other specified complication, without long-term current use of insulin (HCC)   [place] [facility] Mecum, [place] E,  [place]   1 year ago Gross hematuria   [place] [facility] [place], Netta Neat, DO               lisinopril (ZESTRIL) 5 MG tablet [Pharmacy Med Name: Lisinopril 5 MG Oral Tablet] 90 tablet 0    Sig: Take 1 tablet by mouth once daily     Cardiovascular:  ACE Inhibitors Passed - 08/18/2023  9:05 AM      Passed - Cr in normal range and within 180 days    Creat  Date Value Ref Range Status  03/27/2023 0.78 0.50 - 1.05 mg/dL Final   Creatinine, Urine  Date Value Ref Range Status  08/25/2022 246 20 - 275 mg/dL Final         Passed - K in normal range and within 180 days    Potassium  Date Value Ref Range Status  03/27/2023 3.8 3.5 - 5.3 mmol/L Final  10/29/2014 4.5 3.5 - 5.1 mmol/L Final         Passed - Patient is not pregnant      Passed - Last BP in normal range    BP Readings from Last 1 Encounters:  06/11/23 116/76         Passed - Valid encounter within last 6 months    Recent Outpatient Visits           4 months ago Nausea and vomiting, unspecified vomiting type   [place] [facility] [place], Salvadore [place], NP   8 months ago Pre-op examination   [place] [facility] Smitty Cords, DO   10 months ago Type 2 diabetes mellitus with other specified complication, without long-term current use of insulin [facility])   [place] [facility] Smitty Cords, DO   11 months ago Type 2 diabetes mellitus with other specified complication, without long-term current use of insulin [facility])   [place] [facility] Mecum, Oswaldo Conroy, [place]   1 year ago Gross hematuria   [place] [facility] [place], Netta Neat, [place]

## 2023-09-23 ENCOUNTER — Encounter: Payer: Self-pay | Admitting: Family Medicine

## 2023-09-23 ENCOUNTER — Ambulatory Visit (INDEPENDENT_AMBULATORY_CARE_PROVIDER_SITE_OTHER): Payer: Commercial Managed Care - PPO | Admitting: Family Medicine

## 2023-09-23 ENCOUNTER — Other Ambulatory Visit: Payer: Self-pay | Admitting: Internal Medicine

## 2023-09-23 VITALS — BP 116/72 | Ht 66.0 in | Wt 167.0 lb

## 2023-09-23 DIAGNOSIS — Z0000 Encounter for general adult medical examination without abnormal findings: Secondary | ICD-10-CM | POA: Diagnosis not present

## 2023-09-23 DIAGNOSIS — M5136 Other intervertebral disc degeneration, lumbar region with discogenic back pain only: Secondary | ICD-10-CM

## 2023-09-23 DIAGNOSIS — F3342 Major depressive disorder, recurrent, in full remission: Secondary | ICD-10-CM

## 2023-09-23 DIAGNOSIS — E1169 Type 2 diabetes mellitus with other specified complication: Secondary | ICD-10-CM

## 2023-09-23 DIAGNOSIS — E785 Hyperlipidemia, unspecified: Secondary | ICD-10-CM

## 2023-09-23 DIAGNOSIS — Z23 Encounter for immunization: Secondary | ICD-10-CM | POA: Diagnosis not present

## 2023-09-23 DIAGNOSIS — I10 Essential (primary) hypertension: Secondary | ICD-10-CM

## 2023-09-23 MED ORDER — MELOXICAM 15 MG PO TABS: 15.0000 mg | ORAL_TABLET | Freq: Every day | ORAL | 3 refills | Status: DC

## 2023-09-23 MED ORDER — METFORMIN HCL ER 500 MG PO TB24: 500.0000 mg | ORAL_TABLET | Freq: Two times a day (BID) | ORAL | 3 refills | Status: DC

## 2023-09-23 MED ORDER — ROSUVASTATIN CALCIUM 20 MG PO TABS: 20.0000 mg | ORAL_TABLET | Freq: Every day | ORAL | 3 refills | Status: DC

## 2023-09-23 MED ORDER — LISINOPRIL 5 MG PO TABS
5.0000 mg | ORAL_TABLET | Freq: Every day | ORAL | 0 refills | Status: DC
Start: 2023-09-23 — End: 2024-02-02

## 2023-09-23 MED ORDER — OZEMPIC (0.25 OR 0.5 MG/DOSE) 2 MG/3ML ~~LOC~~ SOPN: 0.5000 mg | PEN_INJECTOR | SUBCUTANEOUS | 3 refills | Status: DC

## 2023-09-23 MED ORDER — GABAPENTIN 300 MG PO CAPS: 300.0000 mg | ORAL_CAPSULE | Freq: Two times a day (BID) | ORAL | 3 refills | Status: DC

## 2023-09-23 MED ORDER — SERTRALINE HCL 100 MG PO TABS: 150.0000 mg | ORAL_TABLET | Freq: Every day | ORAL | 3 refills | Status: DC

## 2023-09-23 NOTE — Telephone Encounter (Signed)
Requested Prescriptions  Pending Prescriptions Disp Refills   omeprazole (PRILOSEC) 20 MG capsule [Pharmacy Med Name: Omeprazole 20 MG Oral Capsule Delayed Release] 90 capsule 0    Sig: Take 1 capsule by mouth once daily     Gastroenterology: Proton Pump Inhibitors Passed - 09/23/2023  6:52 AM      Passed - Valid encounter within last 12 months    Recent Outpatient Visits           6 months ago Nausea and vomiting, unspecified vomiting type   [place] [facility] [place], Salvadore [place], NP   10 months ago Pre-op examination   [place] [facility] Smit[facility] Cords, DO   12 months ago Type 2 diabetes mellitus with other specified complication, without long-term current use of insulin [facility])   [place] [facility] [place], Netta Neat, DO   1 year ago Type 2 diabetes mellitus with other specified complication, without long-term current use of insulin [facility])   [place] [facility] Mecum, Oswaldo Conroy, [place]   2 years ago Gross hematuria   [place] [facility] [place], Netta Neat, DO       Future Appointments             Today [place] Charon, Netta Neat, DO  [place]

## 2023-09-23 NOTE — Progress Notes (Unsigned)
Subjective:    Patient ID: Andrea Mcdonald, female    DOB: Apr 03, 1961, 62 y.o.   MRN: [id number]  Andrea Mcdonald is a 62 y.o. female presenting on 09/23/2023 for Medical Management of Chronic Issues (Labs )   HPI  Discussed the use of AI scribe software for clinical note transcription with the patient, who gave verbal consent to proceed.  History of Present Illness          Lumbar Spondylolthisesis  Followed by [facility] Followed by Dr Hyacinth Meeker, and also has seen spine specialist for injection with spinal steroid injection Dr Noralyn Pick it ran up her blood sugar >500. She was seen at ED 08/18/22 for Hyperglycemia.   She is already on Gabapentin for her back, with low back pain and radiating sciatica pain into her R leg and groin and into her RLE into lower leg calf. - She has been referred to Physical therapy, limited results - She asks for referral for 2nd opinion to other Neurosurgery - She has known history of multiple discs in her back that have had problem. - She has taken Meloxicam, Flexeril, and Steroid pak for 12 days with significant temporary relief but it raised her blood sugar   Podiatry Has seen for ingrown toenail Also issue with some callus formation that was shaved She had good pulse circulation in toes but she has cold sensation in toes and asked to follow-up with me.   Neuropathy Cold Toes sensation Sensation is warm on exam but she feels cold Circulation is intact based on prior exam She has seen vascular before.   Type 2 Diabetes Last A1c 11.3 1 month ago, following steroid injection See above hyperglycemia - Interval update last visit 08/25/22 seen for HFU, she was increased Trulicity from 1.5 up to 3.0 mg about 1 month ago. - Currently improved with avg CBG 200s Doing better with sugars now   Health Maintenance:  History of prior partial hysterectomy. Normal pap smears since, she still has cervix. Prior [employer], has been >5+  years.  Consider Shingrix vaccine at pharmacy.  Colonoscopy 09/14/20 1 polyp negative, repeat 7 years, 2028  Mammogram 07/08/23 negative     03/27/2023    1:20 PM 11/27/2022    9:56 AM 07/23/2020   10:37 AM  Depression screen PHQ 2/9  Decreased Interest 1 1 0  Down, Depressed, Hopeless 2 1 0  PHQ - 2 Score 3 2 0  Altered sleeping 2 1 0  Tired, decreased energy 3 1 0  Change in appetite 2 0 0  Feeling bad or failure about yourself  1 0 0  Trouble concentrating 1 1 0  Moving slowly or fidgety/restless 2 0 0  Suicidal thoughts 0 0 0  PHQ-9 Score 14 5 0  Difficult doing work/chores Very difficult Not difficult at all Not difficult at all       03/27/2023    1:20 PM 11/27/2022    9:56 AM 07/23/2020   10:37 AM 07/21/2019   10:42 AM  GAD 7 : Generalized Anxiety Score  Nervous, Anxious, on Edge 3 2 1 1   Control/stop worrying 3 2 1 1   Worry too much - different things 3 2 1 1   Trouble relaxing 3 1 0 1  Restless 3 0 0 1  Easily annoyed or irritable 2 0 0 1  Afraid - awful might happen 0 0 0 1  Total GAD 7 Score 17 7 3 7   Anxiety Difficulty Somewhat difficult  Not difficult at all Not difficult at all Not difficult at all     Past Medical History:  Diagnosis Date   Anxiety    Arthritis    Chronic venous insufficiency    Depression    High cholesterol    History of kidney stones    Hypertension    Plantar fasciitis, bilateral 05/25/2013   PONV (postoperative nausea and vomiting)    n/v x1 after 09-2022 shoulder surgery   RLS (restless legs syndrome)    Type 2 diabetes mellitus (HCC)    Vitamin D deficiency    Past Surgical History:  Procedure Laterality Date   ANTERIOR LATERAL LUMBAR FUSION WITH PERCUTANEOUS SCREW 1 LEVEL N/A 12/10/2022   Procedure: L4-5 LATERAL LUMBAR INTERBODY FUSION WITH POSTERIOR SPINAL FUSION (NUVASIVE);  Surgeon: Venetia Night, MD;  Location: [facility];  Service: Neurosurgery;  Laterality: N/A;   APPLICATION OF INTRAOPERATIVE CT SCAN N/A 12/10/2022    Procedure: APPLICATION OF INTRAOPERATIVE CT SCAN;  Surgeon: Venetia Night, MD;  Location: [facility];  Service: Neurosurgery;  Laterality: N/A;   AUGMENTATION MAMMAPLASTY     BICEPT TENODESIS Right 10/16/2022   Procedure: BICEPS TENOTOMY;  Surgeon: Juanell Fairly, MD;  Location: [facility];  Service: Orthopedics;  Laterality: Right;   COLONOSCOPY WITH PROPOFOL N/A 09/14/2020   Procedure: COLONOSCOPY WITH PROPOFOL;  Surgeon: Wyline Mood, MD;  Location: [facility];  Service: Gastroenterology;  Laterality: N/A;   CYSTOSCOPY     CYSTOSCOPY W/ RETROGRADES Bilateral 11/26/2021   Procedure: CYSTOSCOPY WITH RETROGRADE PYELOGRAM;  Surgeon: Riki Altes, MD;  Location: [facility];  Service: Urology;  Laterality: Bilateral;   ESOPHAGOGASTRIC FUNDOPLICATION  2008   at [facility], for GERD   KIDNEY STONE SURGERY     LITHOTRIPSY     NECK SURGERY     x2   PARTIAL HYSTERECTOMY     PLACEMENT OF BREAST IMPLANTS     right hand trigger release  03/2021   SHOULDER ARTHROSCOPY WITH OPEN ROTATOR CUFF REPAIR AND DISTAL CLAVICLE ACROMINECTOMY Right 10/16/2022   Procedure: SHOULDER ARTHROSCOPY WITH OPEN ROTATOR CUFF REPAIR AND DISTAL CLAVICLE EXCISION, SUBACROMIAL DECOMPRESSION;  Surgeon: Juanell Fairly, MD;  Location: [facility];  Service: Orthopedics;  Laterality: Right;   Social History   Socioeconomic History   Marital status: Married    Spouse name: Not on file   Number of children: Not on file   Years of education: Not on file   Highest education level: Not on file  Occupational History   Not on file  Tobacco Use   Smoking status: Never   Smokeless tobacco: Never  Vaping Use   Vaping status: Never Used  Substance and Sexual Activity   Alcohol use: Yes    Comment: drink occ   Drug use: No   Sexual activity: Yes  Other Topics Concern   Not on file  Social History Narrative   Not on file   Social Determinants of Health   Financial Resource Strain: Not on file  Food Insecurity: No Food  Insecurity (12/10/2022)   Hunger Vital Sign    Worried About Running Out of Food in the Last Year: Never true    Ran Out of Food in the Last Year: Never true  Transportation Needs: No Transportation Needs (12/10/2022)   PRAPARE - Administrator, Civil Service (Medical): No    Lack of Transportation (Non-Medical): No  Physical Activity: Not on file  Stress: Not on file  Social Connections: Not on file  Intimate Partner  Violence: Not At Risk (12/10/2022)   Humiliation, Afraid, Rape, and Kick questionnaire    Fear of Current or Ex-Partner: No    Emotionally Abused: No    Physically Abused: No    Sexually Abused: No   Family History  Problem Relation Age of Onset   Heart disease Mother    Depression Mother    Anxiety disorder Mother    Cancer Mother        retinal   Breast cancer Neg Hx    Current Outpatient Medications on File Prior to Visit  Medication Sig   cephALEXin (KEFLEX) 250 MG capsule Take 750 mg by mouth 2 (two) times daily.   ondansetron (ZOFRAN-ODT) 4 MG disintegrating tablet Take 1 tablet (4 mg total) by mouth every 8 (eight) hours as needed for nausea or vomiting.   No current facility-administered medications on file prior to visit.    Review of Systems Per HPI unless specifically indicated above     Objective:    BP 116/72   Ht 5\' 6"  (1.676 m)   Wt 167 lb (75.8 kg)   BMI 26.95 kg/m   Wt Readings from Last 3 Encounters:  09/23/23 167 lb (75.8 kg)  06/11/23 173 lb (78.5 kg)  03/27/23 173 lb (78.5 kg)    Physical Exam  Results for orders placed or performed in visit on 08/04/23  Lab report - scanned  Result Value Ref Range   EGFR 88.0    A1c 7.5     Diabetic Foot Exam - Simple   Simple Foot Form Diabetic Foot exam was performed with the following findings: Yes 09/23/2023  2:36 PM  Visual Inspection No deformities, no ulcerations, no other skin breakdown bilaterally: Yes Sensation Testing Intact to touch and monofilament testing  bilaterally: Yes Pulse Check Posterior Tibialis and Dorsalis pulse intact bilaterally: Yes Comments        Assessment & Plan:   Problem List Items Addressed This Visit     Essential hypertension   Relevant Medications   rosuvastatin (CRESTOR) 20 MG tablet   lisinopril (ZESTRIL) 5 MG tablet   Other Relevant Orders   CBC with Differential/Platelet   COMPLETE METABOLIC PANEL WITH GFR   TSH   Hyperlipidemia associated with type 2 diabetes mellitus (HCC)   Relevant Medications   rosuvastatin (CRESTOR) 20 MG tablet   OZEMPIC, 0.25 OR 0.5 MG/DOSE, 2 MG/3ML SOPN   metFORMIN (GLUCOPHAGE-XR) 500 MG 24 hr tablet   lisinopril (ZESTRIL) 5 MG tablet   Other Relevant Orders   Lipid panel   TSH   Major depression, recurrent, full remission (HCC)   Relevant Medications   sertraline (ZOLOFT) 100 MG tablet   Type 2 diabetes mellitus with other specified complication (HCC)   Relevant Medications   rosuvastatin (CRESTOR) 20 MG tablet   OZEMPIC, 0.25 OR 0.5 MG/DOSE, 2 MG/3ML SOPN   metFORMIN (GLUCOPHAGE-XR) 500 MG 24 hr tablet   lisinopril (ZESTRIL) 5 MG tablet   Other Relevant Orders   Hemoglobin A1c   CBC with Differential/Platelet   COMPLETE METABOLIC PANEL WITH GFR   Lipid panel   Microalbumin / creatinine urine ratio   Other Visit Diagnoses     Annual physical exam    -  Primary   Relevant Orders   Hemoglobin A1c   CBC with Differential/Platelet   COMPLETE METABOLIC PANEL WITH GFR   Lipid panel   Microalbumin / creatinine urine ratio   TSH   Flu vaccine need  Relevant Orders   Flu vaccine trivalent PF, 6mos and older(Flulaval,Afluria,Fluarix,Fluzone)   Degeneration of intervertebral disc of lumbar region with discogenic back pain       Relevant Medications   meloxicam (MOBIC) 15 MG tablet   gabapentin (NEURONTIN) 300 MG capsule   Other Relevant Orders   CBC with Differential/Platelet   COMPLETE METABOLIC PANEL WITH GFR        Updated Health Maintenance  information ***- Reviewed recent lab results with patient Encouraged improvement to lifestyle with diet and exercise -*** Goal of weight loss  Assessment and Plan              Orders Placed This Encounter  Procedures   Flu vaccine trivalent PF, 6mos and older(Flulaval,Afluria,Fluarix,Fluzone)   Hemoglobin A1c   CBC with Differential/Platelet   COMPLETE METABOLIC PANEL WITH GFR   Lipid panel    Order Specific Question:   Has the patient fasted?    Answer:   Yes   Microalbumin / creatinine urine ratio   TSH    Meds ordered this encounter  Medications   sertraline (ZOLOFT) 100 MG tablet    Sig: Take 1.5 tablets (150 mg total) by mouth at bedtime.    Dispense:  135 tablet    Refill:  3    Add future refills   rosuvastatin (CRESTOR) 20 MG tablet    Sig: Take 1 tablet (20 mg total) by mouth at bedtime.    Dispense:  90 tablet    Refill:  3    Add future refills   OZEMPIC, 0.25 OR 0.5 MG/DOSE, 2 MG/3ML SOPN    Sig: Inject 0.5 mg into the skin once a week.    Dispense:  9 mL    Refill:  3    84 day supply Add future refills   metFORMIN (GLUCOPHAGE-XR) 500 MG 24 hr tablet    Sig: Take 1 tablet (500 mg total) by mouth 2 (two) times daily with a meal.    Dispense:  180 tablet    Refill:  3   meloxicam (MOBIC) 15 MG tablet    Sig: Take 1 tablet (15 mg total) by mouth daily.    Dispense:  90 tablet    Refill:  3   lisinopril (ZESTRIL) 5 MG tablet    Sig: Take 1 tablet (5 mg total) by mouth daily.    Dispense:  90 tablet    Refill:  0    Add future refills   gabapentin (NEURONTIN) 300 MG capsule    Sig: Take 1 capsule (300 mg total) by mouth 2 (two) times daily.    Dispense:  180 capsule    Refill:  3    Add future refills     Follow up plan: Return for within 4 weeks Pap Smear only w Rene Kocher / then 6 month DM A1c w/ me.  ***Pap smear only w/ Rene Kocher within 4 weeks  Saralyn Pilar, DO [facility] 09/23/2023,  2:30 PM

## 2023-09-23 NOTE — Patient Instructions (Addendum)
Thank you for coming to the office today.  Refilled all meds  Flu Shot today  Shingles at the pharmacy if interested  TODAY DUE for FASTING BLOOD WORK   Please schedule a Follow-up Appointment to: Return for within 4 weeks Pap Smear only w Rene Kocher / then 6 month DM A1c w/ me.  If you have any other questions or concerns, please feel free to call the office or send a message through MyChart. You may also schedule an earlier appointment if necessary.  Additionally, you may be receiving a survey about your experience at our office within a few days to 1 week by e-mail or mail. We value your feedback.  Saralyn Pilar, DO [facility], [place]

## 2023-09-24 ENCOUNTER — Encounter: Payer: Self-pay | Admitting: Family Medicine

## 2023-09-24 LAB — CBC WITH DIFFERENTIAL/PLATELET
Absolute Lymphocytes: 3612 {cells}/uL (ref 850–3900)
Absolute Monocytes: 554 {cells}/uL (ref 200–950)
Basophils Absolute: 50 {cells}/uL (ref 0–200)
Basophils Relative: 0.6 %
Eosinophils Absolute: 59 {cells}/uL (ref 15–500)
Eosinophils Relative: 0.7 %
HCT: 39.9 % (ref 35.0–45.0)
Hemoglobin: 12.7 g/dL (ref 11.7–15.5)
MCH: 27.6 pg (ref 27.0–33.0)
MCHC: 31.8 g/dL — ABNORMAL LOW (ref 32.0–36.0)
MCV: 86.7 fL (ref 80.0–100.0)
MPV: 11 fL (ref 7.5–12.5)
Monocytes Relative: 6.6 %
Neutro Abs: 4124 {cells}/uL (ref 1500–7800)
Neutrophils Relative %: 49.1 %
Platelets: 228 10*3/uL (ref 140–400)
RBC: 4.6 10*6/uL (ref 3.80–5.10)
RDW: 13.2 % (ref 11.0–15.0)
Total Lymphocyte: 43 %
WBC: 8.4 10*3/uL (ref 3.8–10.8)

## 2023-09-24 LAB — COMPLETE METABOLIC PANEL WITH GFR
AG Ratio: 1.7 (calc) (ref 1.0–2.5)
ALT: 27 U/L (ref 6–29)
AST: 22 U/L (ref 10–35)
Albumin: 4.6 g/dL (ref 3.6–5.1)
Alkaline phosphatase (APISO): 82 U/L (ref 37–153)
BUN: 21 mg/dL (ref 7–25)
CO2: 30 mmol/L (ref 20–32)
Calcium: 10.5 mg/dL — ABNORMAL HIGH (ref 8.6–10.4)
Chloride: 103 mmol/L (ref 98–110)
Creat: 0.86 mg/dL (ref 0.50–1.05)
Globulin: 2.7 g/dL (ref 1.9–3.7)
Glucose, Bld: 154 mg/dL — ABNORMAL HIGH (ref 65–99)
Potassium: 4.4 mmol/L (ref 3.5–5.3)
Sodium: 141 mmol/L (ref 135–146)
Total Bilirubin: 1 mg/dL (ref 0.2–1.2)
Total Protein: 7.3 g/dL (ref 6.1–8.1)
eGFR: 76 mL/min/{1.73_m2} (ref >=60)

## 2023-09-24 LAB — LIPID PANEL
Cholesterol: 147 mg/dL (ref <200)
HDL: 80 mg/dL (ref >=50)
LDL Cholesterol (Calc): 47 mg/dL
Non-HDL Cholesterol (Calc): 67 mg/dL (ref <130)
Total CHOL/HDL Ratio: 1.8 (calc) (ref <5.0)
Triglycerides: 116 mg/dL (ref <150)

## 2023-09-24 LAB — HEMOGLOBIN A1C
Hgb A1c MFr Bld: 8.5 %{Hb} — ABNORMAL HIGH (ref <5.7)
Mean Plasma Glucose: 197 mg/dL
eAG (mmol/L): 10.9 mmol/L

## 2023-09-24 LAB — MICROALBUMIN / CREATININE URINE RATIO
Creatinine, Urine: 179 mg/dL (ref 20–275)
Microalb Creat Ratio: 8 mg/g{creat} (ref <30)
Microalb, Ur: 1.5 mg/dL

## 2023-09-24 LAB — TSH: TSH: 1.59 m[IU]/L (ref 0.40–4.50)

## 2023-10-07 ENCOUNTER — Ambulatory Visit: Payer: Self-pay

## 2023-10-07 DIAGNOSIS — E1169 Type 2 diabetes mellitus with other specified complication: Secondary | ICD-10-CM

## 2023-10-07 MED ORDER — OZEMPIC (1 MG/DOSE) 4 MG/3ML ~~LOC~~ SOPN
1.0000 mg | PEN_INJECTOR | SUBCUTANEOUS | 1 refills | Status: DC
Start: 2023-10-07 — End: 2023-10-13

## 2023-10-07 NOTE — Telephone Encounter (Signed)
Please notify patient that I have sent her increased dose Ozempic 1mg  weekly to her pharmacy for 3 month supply +1 refill. Previous dose was 0.5mg .  Please let us know if any significant difficulty with medication coverage or pick up at pharmacy.  Saralyn Pilar, DO [facility] 10/07/2023, 5:38 PM

## 2023-10-07 NOTE — Addendum Note (Signed)
Addended by: Smitty Cords on: 10/07/2023 05:38 PM   Modules accepted: Orders

## 2023-10-07 NOTE — Telephone Encounter (Signed)
Summary: OZEMPIC, 0.25 OR 0.5 MG/DOSE, 2 MG/3ML SOPN?   The patient would like to talk with her provider about the medication prescribed, OZEMPIC, 0.25 OR 0.5 MG/DOSE, 2 MG/3ML SOPN/ She wants to know if the dosage should be upped? She uses  [facility] [address] ([address]), [place] - [address] Phone: [phone] Fax: [phone]   Please assist patient further     Chief Complaint: Pt. Asking if Ozempic should be increased due to last A1c. Symptoms: n/a Frequency: n/a Pertinent Negatives: Patient denies  Disposition: [] ED /[] Urgent Care (no appt availability in office) / [] Appointment(In office/virtual)/ []  [facility]/ [] Home Care/ [] Refused Recommended Disposition /[] [place] [facility]/ [x]  Follow-up with PCP Additional Notes: Please advise pt. Thanks.  Reason for Disposition  [1] Caller has URGENT medicine question about med that PCP or specialist prescribed AND [2] triager unable to answer question  Answer Assessment - Initial Assessment Questions 1. NAME of MEDICINE: "What medicine(s) are you calling about?"     Ozempic 2. QUESTION: "What is your question?" (e.g., double dose of medicine, side effect)     Should I increase the dose according to last A1c? 3. PRESCRIBER: "Who prescribed the medicine?" Reason: if prescribed by specialist, call should be referred to that group.     Dr. Althea Charon 4. SYMPTOMS: "Do you have any symptoms?" If Yes, ask: "What symptoms are you having?"  "How bad are the symptoms (e.g., mild, moderate, severe)     N/a 5. PREGNANCY:  "Is there any chance that you are pregnant?" "When was your last menstrual period?"     No  Protocols used: Medication Question Call-A-AH

## 2023-10-12 NOTE — Progress Notes (Signed)
(  Key: [id number])  form thumbnail [employer] has not yet replied to your PA request. Depending on the information you've provided, additional questions may be returned by the plan. You may close this dialog, return to your dashboard, and perform other tasks.  To check for an update later, open this request again from your dashboard.  If [employer] has not replied to your request within 24 hours please contact [employer] at [phone].

## 2023-10-13 ENCOUNTER — Other Ambulatory Visit: Payer: Self-pay | Admitting: Family Medicine

## 2023-10-13 DIAGNOSIS — E1169 Type 2 diabetes mellitus with other specified complication: Secondary | ICD-10-CM

## 2023-10-13 MED ORDER — TRULICITY 1.5 MG/0.5ML ~~LOC~~ SOAJ
1.5000 mg | SUBCUTANEOUS | 2 refills | Status: DC
Start: 2023-10-13 — End: 2024-01-06

## 2023-10-13 NOTE — Progress Notes (Signed)
Prior authorization started on trulicity.

## 2023-10-13 NOTE — Progress Notes (Signed)
Patient was denied Ozempic by [employer].

## 2023-10-14 NOTE — Progress Notes (Signed)
(  Key: [id number])  form thumbnail This request has received an approval. View the bottom of the request for an electronic copy of the approval letter.

## 2023-10-14 NOTE — Progress Notes (Signed)
(  Key: [id number])  form thumbnail This request has received a denial. View the bottom of the request for an electronic copy of the denial letter with a list of denial reasons from the plan.  After reading the electronic denial letter, you may choose to complete an appeal. View the bottom of the request to see if an eAppeal is available.

## 2023-10-14 NOTE — Progress Notes (Signed)
Member Margo Common Notice Date 10/12/2023 Saralyn Pilar [address] [place], [place] [address] Prescriber: Saralyn Pilar Re: Margo Common DOB: 1961/09/05 Notice of adverse decision [facility], the utilization review entity for [facility] Exchange - [place] - [facility], received a request for coverage of Ozempic 4mg /22ml for you. We've denied the request for the following reason(s): Your plan only covers this drug when you meet one of these options: A) You have tried other products your plan covers (preferred products), and they did not work well for you, or B) Your doctor gives [place] a medical reason you cannot take those other products. For your plan, you may need to try up to three preferred products. We have denied your request because you do not meet any of these conditions. We reviewed the information we had. Your request has been denied. Your doctor can send [place] any new or missing information for [place] to review. The preferred products for your plan are: Trulicity; Victoza/liraglutide. Your doctor may need to get approval from your plan for preferred products. For this drug, you may have to meet other criteria. You can request the drug policy for more details. You can also request other plan documents for your review. An explanation of the full clinical review criteria used in our decision is included in this notice. Information about coverage denials We based this coverage denial on the terms of your benefit plan. Your plan does not cover services that are not medically necessary or that are contractually excluded. For a full explanation of the coverage available, please review your Certificate of Coverage. The Pharmacy Benefit Exclusions section provides details. You can request a copy of the contract language by contacting [employer]. Their toll-free phone number is on your member ID card. Pharmacy Clinical Policy Bulletins (PCPBs) are used to  support coverage decisions related to medical necessity. These PCPBs are available online at [email or web address]. On the web address provided above, scroll down to the "Pharmacy Criteria" search tool, select the current or previous year, type the medication name in the search engine field and click go. From the results page, click on the medication PCPB link to open and view the pharmacy clinical policy bulletin for the particular drug. If you'd like, we'll send you a printed copy of the applicable PCPB free of charge. To request it, please call the dedicated [facility] call center supporting [employer] members at the toll-free number on the back of your member ID card. The name of the formulary can be found on the bottom of this letter directly under the signature starting with the note: PA#. If you have received additional services/treatment related to this request after the date of this letter, you may submit a new request for coverage to the [facility] Prior Authorization Department. If your prescriber would like to discuss this decision with a clinical reviewer at [facility], your prescriber can call [facility], and we will arrange to make someone available to speak with your prescriber. Member information and appeal rights You may not agree with our decision. You or your prescriber may file an appeal. You may also have a relative, friend, advocate, or anyone else (including an attorney) act on your behalf as your authorized representative. Do this by phone or in writing within 180 days (6 months) after you receive this letter. Some plans give more than 180 days to do this. See your plan brochure or other plan document, such as your Certificate of Coverage or your Summary  Plan Description. How to ask for an appeal by phone Call Member Services. The toll-free telephone number is listed on your  member ID card. If you are hearing impaired, you can call [phone] for Telecommunication [employer]ay [employer] (TRS). Member Services can also help you with the process of naming an authorized representative. How to ask for an appeal in writing You or your authorized representative can send a letter or a completed Member Complaint and Appeal Form to the address below. The form is online at: [email or web address] Community education officer Resolution Team [address] [address] [place], [place] [address] Your request should include:  Your name;  Your member ID number (or date of birth) or other identifying information;  The group's name (for example, if you are covered by your employer);  Comments, documents, records, and other information you want [place] to consider. You may also ask [place] for documents that are relevant to the unfavorable decision for your review. These are free. Call Member Services to ask for them. The toll-free telephone number is listed on your member ID card. In general, one level of internal appeal is available under health plans providing coverage for individuals, and two levels of internal appeal are available under plans covering employees of an employer. One-level appeal process If your plan offers a single appeal, we will send you a decision within 30 days after we receive your request. If your appeal is urgent (one where your doctor believes a delay in making a decision could put your life, health or ability to regain full function at serious risk, or could cause you severe pain), you, your doctor or other authorized representative can request a faster review. To do this, call the National Clinical Appeal Unit expedited appeal toll-free number at [phone]. You can also fax your request toll-free to [phone]. The National Clinical Appeal Unit will document phone requests in writing. We will give you a decision within 72 hours after we receive  your request for review. If your appeal is urgent, you may also request an expedited external review at the same time as the expedited internal appeal. Two-level appeal process If your plan provides for two appeals and your appeal is pre-service (this means you need approval for coverage before you receive medical care), we will send you a decision within 15 days after we receive your request. For post-service appeal requests, we will send you a decision within 30 days after we receive your request. In either case, if you do not agree with the decision you have the right to file a second request for appeal. To do this, call or write to [place] within 60 days from the date that you receive the first appeal decision. If your appeal is urgent (one where your doctor believes a delay in making a decision could put your life, health or ability to regain full function at serious risk, or could cause you severe pain), you, your doctor or other authorized representative can request a faster review. To do this, call the National Clinical Appeal Unit expedited appeal toll-free number at [phone]. You can also fax your request toll-free to [phone]. The National Clinical Appeal Unit will document phone requests in writing. We will give you a decision within 36 hours after we receive your request for review. If your appeal is urgent, you may also request an expedited external review at the same time as the expedited internal appeal. After your appeal, if we continue to deny the payment, coverage, or service requested or you do not receive  a timely decision, you may be able to request an external review by an independent third party, who will review our decision and make a final decision. Services provided by the [employer] [place] are available through the [employer]. To reach this Program, contact: Health Insurance [facility] [employer] Department of  Insurance [address] [place], [place] [address] Toll-Free Telephone: [phone] Right to external review Standard external review Under [facility] law, you may ask for an external independent review of this decision. The review will be completed by an independent review organization (IRO). You may ask for an external review after you have completed the internal appeal process. If you are asking for an expedited review, you do not have to complete the internal review process. In addition, you can skip the internal appeal process when we agree to do so or if you have not received a decision to your internal appeal request within the timeframes described above. You or an authorized representative must make your request for the external review to the [employer] (NCDOI). The request must be made within 120 days of the date of this notice of non-certification. You have the right to submit additional information to support your request for review. The review is free. The NCDOI will determine if your request is complete and eligible for review. The NCDOI will arrange for the review by the IRO. Expedited external review If you file an expedited appeal, and you have a medical condition where the time frame for completion of an expedited appeal would reasonably be expected to:  Put your life or health, or  your ability to regain full function at risk or cause you severe pain, then at the same time, you may make a written or phone request for an expedited external review with the [facility] of Insurance (NCDOI). The NCDOI will tell you if you must complete the expedited review with [place] first. A complete description of the external review process is available in your member handbook. You may also request a copy by calling the Member Services toll-free telephone number listed on your member ID card. When filing a request for external review,  you will have to approve the release of any medical records that may be needed to make a decision. NCDOI is also available to help you understand the external review process and your right to an External Review under [facility]. To request an External Review or if you have additional questions about your right to an External Review, contact the NCDOI at: [facility] Review Program [address] [place], [place] [address] [phone] [email or web address] for External Review Information and Request Form (visit External Review Program site) You or your authorized representative (who could be your doctor) have the option to appeal directly to an independent review organization if we have denied your non-formulary drug exception request. Please follow the instructions above for requesting the external review. If you do not agree with the final determination on review, you have the right to bring a civil action under Section 502(a) of ERISA, if applicable. We protect your privacy: Protecting the privacy of member health information is our top priority. For that reason, we'll ask verification questions when you or your representative call us. Please be prepared to provide the member's name, ID number and date of birth. If you suspect fraud or abuse involving your health benefits, please call the toll-free Hotline at [phone].  Or contact us by email at [email or web address]. Resources available to help you Need help understanding this notice or our decision? Call us free of charge at the 1-800 number on your member ID card. There are also other resources available to help you. Most plans are now subject to the Affordable Care Act. If your plan is provided by your employer, call us, or ask your employer if your plan is subject to the law. If it is, you can also contact the Employee Benefits Security Administration for help. You can reach them at  1-866-444-EBSA ([phone]). In addition, a consumer assistance program is available to assist you. By Mail: [facility] Department of [facility] [address] [place], [place] [address] Toll-Free Telephone: [phone] [email or web address] In person: [address] [place], [place] [address] Sincerely, MD Medical Director We have sent a copy of this notice to the member and prescriber. 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 Monia Pouch is the brand name used for products and services provided by one or more of the [employer] group of companies, including [employer] and its affiliates Administrator). Pharmacy benefits are administered through an affiliated pharmacy benefit Production designer, theatre/television/film, [facility]. Monia Pouch is part of the [facility] family of companies. 2024 [employer]. [id number] [phone] [id number] Formulary Product, Quantity Limit Exception Authorization may be granted for the requested drug when the request is for a formulary product AND the requested quantity is for more than the initial quantity limit when ALL of the following criteria are met:  The requested product is being used for an FDA-approved indication OR an indication supported in the compendia of current literature (examples: AHFS, Micromedex, current accepted guidelines)  The prescribed dose and quantity fall within the FDA-approved labeling OR within dosing guidelines found in the compendia of current literature Formulary  Exception Authorization may be granted for the requested drug when ALL of the following criteria are met:  The requested product is being used for an FDA-approved indication OR an indication supported in the compendia of current literature (examples: AHFS, Micromedex, current accepted guidelines)  The prescribed dose and quantity fall within the FDA-approved labeling OR within dosing guidelines found in the compendia of current literature  If the request is for a combination product for which individual components are available at similar doses on formulary, then the patient must have had a trial and failure of the separate individual components due to an adverse event (examples: rash, nausea, vomiting, anaphylaxis) that is thought to be due to an inactive ingredient. [ACTION REQUIRED: Documentation is required for approval]  If the request is for a brand name product that has a generic available on formulary, then the patient must have had a trial and failure of the generic agent due to an adverse event (examples: rash, nausea, vomiting, anaphylaxis) that is thought to be due to an inactive ingredient  If the request is for a product with an available alternative dosage form for the same active ingredient on formulary, then there must be a clinical reason why the patient is unable to take an applicable alternative formulary dosage form based on the patient's condition (  e.g., age, indication)  The patient meets ONE of the following: o The patient is unable to take the required number of formulary alternatives for the given diagnosis due to a trial and inadequate treatment response or intolerance, or a contraindication. [ACTION REQUIRED: Documentation is required for approval.] o The patient meets ANY of the following: the patient has a clinical condition or needs a specific dosage form for which there is no formulary alternative or the listed formulary alternatives are not recommended  based on published guidelines or clinical literature, the formulary alternatives will likely be ineffective or less effective for the patient, the formulary alternatives will likely cause an adverse effect. [ACTION REQUIRED: Documentation is required for approval.] DURATION OF APPROVAL (DOA)  998-A: DOA: 12 months

## 2023-10-14 NOTE — Progress Notes (Signed)
(  Key: [facility]) PA Case ID #: [id number] Rx #: [id number] Need Help? Call us at [phone] Outcome Approved on December 17 by [facility] NCPDP 2017 Your PA request has been approved. Additional information will be provided in the approval communication. (Message 1145) Authorization Expiration Date: 10/11/2024

## 2023-10-22 ENCOUNTER — Ambulatory Visit: Payer: Commercial Managed Care - PPO | Admitting: Internal Medicine

## 2023-10-26 ENCOUNTER — Other Ambulatory Visit (HOSPITAL_COMMUNITY)
Admission: RE | Admit: 2023-10-26 | Discharge: 2023-10-26 | Disposition: A | Discharge status: Home / Self Care | Payer: 59 | Source: Ambulatory Visit | Attending: Internal Medicine | Admitting: Internal Medicine

## 2023-10-26 ENCOUNTER — Encounter: Payer: Self-pay | Admitting: Internal Medicine

## 2023-10-26 ENCOUNTER — Ambulatory Visit: Payer: 59 | Admitting: Internal Medicine

## 2023-10-26 VITALS — BP 116/70 | Ht 66.0 in | Wt 171.6 lb

## 2023-10-26 DIAGNOSIS — Z01419 Encounter for gynecological examination (general) (routine) without abnormal findings: Secondary | ICD-10-CM | POA: Insufficient documentation

## 2023-10-26 DIAGNOSIS — Z124 Encounter for screening for malignant neoplasm of cervix: Secondary | ICD-10-CM | POA: Insufficient documentation

## 2023-10-26 NOTE — Progress Notes (Signed)
Subjective:    Patient ID: Andrea Mcdonald, female    DOB: 1961-02-10, 62 y.o.   MRN: [id number]  HPI  Patient presents to clinic today for her Pap smear.  She reports she has had a partial hysterectomy but still has her cervix.  Her last Pap was greater than +5 years ago.  Review of Systems   Past Medical History:  Diagnosis Date   Anxiety    Arthritis    Chronic venous insufficiency    Depression    High cholesterol    History of kidney stones    Hypertension    Plantar fasciitis, bilateral 05/25/2013   PONV (postoperative nausea and vomiting)    n/v x1 after 09-2022 shoulder surgery   RLS (restless legs syndrome)    Type 2 diabetes mellitus (HCC)    Vitamin D deficiency     Current Outpatient Medications  Medication Sig Dispense Refill   cephALEXin (KEFLEX) 250 MG capsule Take 750 mg by mouth 2 (two) times daily.     gabapentin (NEURONTIN) 300 MG capsule Take 1 capsule (300 mg total) by mouth 2 (two) times daily. 180 capsule 3   lisinopril (ZESTRIL) 5 MG tablet Take 1 tablet (5 mg total) by mouth daily. 90 tablet 0   meloxicam (MOBIC) 15 MG tablet Take 1 tablet (15 mg total) by mouth daily. 90 tablet 3   metFORMIN (GLUCOPHAGE-XR) 500 MG 24 hr tablet Take 1 tablet (500 mg total) by mouth 2 (two) times daily with a meal. 180 tablet 3   ondansetron (ZOFRAN-ODT) 4 MG disintegrating tablet Take 1 tablet (4 mg total) by mouth every 8 (eight) hours as needed for nausea or vomiting. 30 tablet 0   rosuvastatin (CRESTOR) 20 MG tablet Take 1 tablet (20 mg total) by mouth at bedtime. 90 tablet 3   sertraline (ZOLOFT) 100 MG tablet Take 1.5 tablets (150 mg total) by mouth at bedtime. 135 tablet 3   TRULICITY 1.5 MG/0.5ML SOAJ Inject 1.5 mg into the skin once a week. 2 mL 2   No current facility-administered medications for this visit.    Allergies  Allergen Reactions   Codeine Itching    Family History  Problem Relation Age of Onset   Heart disease Mother    Depression  Mother    Anxiety disorder Mother    Cancer Mother        retinal   Breast cancer Neg Hx     Social History   Socioeconomic History   Marital status: Married    Spouse name: Not on file   Number of children: Not on file   Years of education: Not on file   Highest education level: Not on file  Occupational History   Not on file  Tobacco Use   Smoking status: Never   Smokeless tobacco: Never  Vaping Use   Vaping status: Never Used  Substance and Sexual Activity   Alcohol use: Yes    Comment: drink occ   Drug use: No   Sexual activity: Yes  Other Topics Concern   Not on file  Social History Narrative   Not on file   Social Drivers of Health   Financial Resource Strain: Low Risk  (09/23/2023)   Overall Financial Resource Strain (CARDIA)    Difficulty of Paying Living Expenses: Not hard at all  Food Insecurity: No Food Insecurity (12/10/2022)   Hunger Vital Sign    Worried About Running Out of Food in the Last Year: Never true  Ran Out of Food in the Last Year: Never true  Transportation Needs: No Transportation Needs (12/10/2022)   PRAPARE - Administrator, Civil Service (Medical): No    Lack of Transportation (Non-Medical): No  Physical Activity: Inactive (09/23/2023)   Exercise Vital Sign    Days of Exercise per Week: 0 days    Minutes of Exercise per Session: 0 min  Stress: Stress Concern Present (09/23/2023)   [employer] of Occupational Health - Occupational Stress Questionnaire    Feeling of Stress : To some extent  Social Connections: Moderately Isolated (09/23/2023)   Social Connection and Isolation Panel [NHANES]    Frequency of Communication with Friends and Family: More than three times a week    Frequency of Social Gatherings with Friends and Family: Once a week    Attends Religious Services: Never    Database administrator or Organizations: No    Attends Banker Meetings: Never    Marital Status: Married  Careers information officer Violence: Not At Risk (12/10/2022)   Humiliation, Afraid, Rape, and Kick questionnaire    Fear of Current or Ex-Partner: No    Emotionally Abused: No    Physically Abused: No    Sexually Abused: No     Constitutional: Denies fever, malaise, fatigue, headache or abrupt weight changes.  Respiratory: Denies difficulty breathing, shortness of breath, cough or sputum production.   Cardiovascular: Denies chest pain, chest tightness, palpitations or swelling in the hands or feet.  Gastrointestinal: Denies abdominal pain, bloating, constipation, diarrhea or blood in the stool.  GU: Denies urgency, frequency, pain with urination, burning sensation, blood in urine, odor or discharge.   No other specific complaints in a complete review of systems (except as listed in HPI above).      Objective:   Physical Exam  BP 116/70 (BP Location: Left Arm, Patient Position: Sitting, Cuff Size: Normal)   Ht 5\' 6"  (1.676 m)   Wt 171 lb 9.6 oz (77.8 kg)   BMI 27.70 kg/m   Wt Readings from Last 3 Encounters:  09/23/23 167 lb (75.8 kg)  06/11/23 173 lb (78.5 kg)  03/27/23 173 lb (78.5 kg)    General: Appears her stated age, overweight, in NAD. Cardiovascular: Normal rate and rhythm. Pulmonary/Chest: Normal effort and positive vesicular breath sounds. No respiratory distress. No wheezes, rales or ronchi noted.  Abdomen: Soft and nontender.  Pelvic: Normal female anatomy.  Cervix not visualized.  Adnexa nonpalpable. Musculoskeletal:  No difficulty with gait.  Neurological: Alert and oriented.   BMET    Component Value Date/Time   NA 141 09/23/2023 1454   NA 144 12/11/2015 1428   NA 139 10/29/2014 1910   K 4.4 09/23/2023 1454   K 4.5 10/29/2014 1910   CL 103 09/23/2023 1454   CL 102 10/29/2014 1910   CO2 30 09/23/2023 1454   CO2 32 10/29/2014 1910   GLUCOSE 154 (H) 09/23/2023 1454   GLUCOSE 169 (H) 10/29/2014 1910   BUN 21 09/23/2023 1454   BUN 15 12/11/2015 1428   BUN 16  10/29/2014 1910   CREATININE 0.86 09/23/2023 1454   CALCIUM 10.5 (H) 09/23/2023 1454   CALCIUM 9.7 10/29/2014 1910   GFRNONAA >60 08/18/2022 0906   GFRNONAA 75 07/23/2020 1059   GFRAA 87 07/23/2020 1059    Lipid Panel     Component Value Date/Time   CHOL 147 09/23/2023 1454   CHOL 126 03/20/2016 1123   TRIG 116 09/23/2023 1454  HDL 80 09/23/2023 1454   HDL 58 03/20/2016 1123   CHOLHDL 1.8 09/23/2023 1454   VLDL 51 (H) 07/08/2016 1559   LDLCALC 47 09/23/2023 1454    CBC    Component Value Date/Time   WBC 8.4 09/23/2023 1454   RBC 4.60 09/23/2023 1454   HGB 12.7 09/23/2023 1454   HGB 12.2 12/11/2015 1428   HCT 39.9 09/23/2023 1454   HCT 36.4 12/11/2015 1428   PLT 228 09/23/2023 1454   PLT 210 12/11/2015 1428   MCV 86.7 09/23/2023 1454   MCV 85 12/11/2015 1428   MCV 88 10/29/2014 1910   MCH 27.6 09/23/2023 1454   MCHC 31.8 (L) 09/23/2023 1454   RDW 13.2 09/23/2023 1454   RDW 14.0 12/11/2015 1428   RDW 13.6 10/29/2014 1910   LYMPHSABS 1,606 11/27/2022 1022   LYMPHSABS 1.4 12/11/2015 1428   EOSABS 59 09/23/2023 1454   EOSABS 0.0 12/11/2015 1428   BASOSABS 50 09/23/2023 1454   BASOSABS 0.0 12/11/2015 1428    Hgb A1C Lab Results  Component Value Date   HGBA1C 8.5 (H) 09/23/2023            Assessment & Plan:   Screen for cervical cancer with routine GYN exam:  Unable to visualize cervix and is unclear if she has had a partial hysterectomy with with retained cervix or not Pap of vaginal wall today  Follow-up with your PCP as previously scheduled Nicki Reaper, NP

## 2023-10-26 NOTE — Patient Instructions (Signed)
Pap Test Why am I having this test? A Pap test, also called a Pap smear, is a screening test to check for signs of: Infection. Cancer of the cervix. The cervix is the lower part of the uterus that opens into the vagina. Changes that may be a sign that cancer is developing (precancerous changes). Women need this test on a regular basis. In general, you should have a Pap test every 3 years until you reach menopause or age 63. Women aged 30-60 may choose to have their Pap test done at the same time as an HPV (human papillomavirus) test every 5 years (instead of every 3 years). Your health care provider may recommend having Pap tests more or less often depending on your medical conditions and past Pap test results. What is being tested? Cervical cells are tested for signs of infection or abnormalities. What kind of sample is taken?  Your health care provider will collect a sample of cells from the surface of your cervix. This will be done using a small cotton swab, plastic spatula, or brush that is inserted into your vagina using a tool called a speculum. This sample is often collected during a pelvic exam, when you are lying on your back on an exam table with your feet in footrests (stirrups). In some cases, fluids (secretions) from the cervix or vagina may also be collected. How do I prepare for this test? Be aware of where you are in your menstrual cycle. If you are menstruating on the day of the test, you may be asked to reschedule. You may need to reschedule if you have a known vaginal infection on the day of the test. Follow instructions from your health care provider about: Changing or stopping your regular medicines. Some medicines can cause abnormal test results, such as vaginal medicines and tetracycline. Avoiding douching 2-3 days before or the day of the test. Tell a health care provider about: Any allergies you have. All medicines you are taking, including vitamins, herbs, eye drops,  creams, and over-the-counter medicines. Any bleeding problems you have. Any surgeries you have had. Any medical conditions you have. Whether you are pregnant or may be pregnant. How are the results reported? Your test results will be reported as either abnormal or normal. What do the results mean? A normal test result means that you do not have signs of cancer of the cervix. An abnormal result may mean that you have: Cancer. A Pap test by itself is not enough to diagnose cancer. You will have more tests done if cancer is suspected. Precancerous changes in your cervix. Inflammation of the cervix. An STI (sexually transmitted infection). A fungal infection. A parasite infection. Talk with your health care provider about what your results mean. In some cases, your health care provider may do more testing to confirm the results. Questions to ask your health care provider Ask your health care provider, or the department that is doing the test: When will my results be ready? How will I get my results? What are my treatment options? What other tests do I need? What are my next steps? Summary In general, women should have a Pap test every 3 years until they reach menopause or age 63. Your health care provider will collect a sample of cells from the surface of your cervix. This will be done using a small cotton swab, plastic spatula, or brush. In some cases, fluids (secretions) from the cervix or vagina may also be collected. This information is not   intended to replace advice given to you by your health care provider. Make sure you discuss any questions you have with your health care provider. Document Revised: 01/11/2021 Document Reviewed: 01/11/2021 Elsevier Patient Education  2024 Elsevier Inc.  

## 2023-10-29 LAB — CYTOLOGY - PAP
Comment: NEGATIVE
Diagnosis: NEGATIVE
High risk HPV: NEGATIVE

## 2023-11-27 ENCOUNTER — Telehealth: Payer: Self-pay | Admitting: Neurosurgery

## 2023-11-27 ENCOUNTER — Encounter: Payer: Self-pay | Admitting: Family Medicine

## 2023-11-27 NOTE — Telephone Encounter (Signed)
Patient is calling to request a letter stating that she has metal in her back from her surgery with Dr. Myer Haff to take with her on her upcoming cruise. She would also like to make sure that if she ever needed to have an MRI that the metal would not interfere. She would like to pickup a copy of the letter once it is ready.

## 2023-11-27 NOTE — Telephone Encounter (Signed)
Patient notified the letter has been placed up front. I informed her as well that she is able to have an MRI.

## 2024-01-06 ENCOUNTER — Other Ambulatory Visit: Payer: Self-pay | Admitting: Family Medicine

## 2024-01-06 DIAGNOSIS — E1169 Type 2 diabetes mellitus with other specified complication: Secondary | ICD-10-CM

## 2024-01-06 NOTE — Telephone Encounter (Signed)
 Requested medication (s) are due for refill today: yes  Requested medication (s) are on the active medication list: yes  Last refill:  10/13/23 @ ml 2 RF  Future visit scheduled: yes  Notes to clinic:  abnormal labs   Requested Prescriptions  Pending Prescriptions Disp Refills   TRULICITY 1.5 MG/0.5ML SOAJ [Pharmacy Med Name: Trulicity 1.5 MG/0.5ML Subcutaneous Solution Pen-injector] 4 mL 0    Sig: Inject 1.5 mg into the skin once a week.     Endocrinology:  Diabetes - GLP-1 Receptor Agonists Failed - 01/06/2024  3:59 PM      Failed - HBA1C is between 0 and 7.9 and within 180 days    Hgb A1c MFr Bld  Date Value Ref Range Status  09/23/2023 8.5 (H) <5.7 % of total Hgb Final    Comment:    For someone without known diabetes, a hemoglobin A1c value of 6.5% or greater indicates that they may have  diabetes and this should be confirmed with a follow-up  test. . For someone with known diabetes, a value <7% indicates  that their diabetes is well controlled and a value  greater than or equal to 7% indicates suboptimal  control. A1c targets should be individualized based on  duration of diabetes, age, comorbid conditions, and  other considerations. . Currently, no consensus exists regarding use of hemoglobin A1c for diagnosis of diabetes for children. .    A1c  Date Value Ref Range Status  03/27/2023 7.5  Final    Comment:    Abstracted by HIM         Passed - Valid encounter within last 6 months    Recent Outpatient Visits           2 months ago Encounter for gynecological examination without abnormal finding   [facility] [place], Salvadore Oxford, NP   3 months ago Annual physical exam   [facility] Smitty Cords, DO   9 months ago Nausea and vomiting, unspecified vomiting type   [facility] [place], Salvadore Oxford, NP   1 year ago Pre-op examination   [facility] Smitty Cords, DO   1 year ago Type 2 diabetes mellitus with other specified complication, without long-term current use of insulin [facility])   [place] Althea Charon, Netta Neat, DO       Future Appointments             In 2 months Althea Charon, Netta Neat, DO [facility]

## 2024-01-12 ENCOUNTER — Telehealth: Payer: Self-pay

## 2024-01-12 DIAGNOSIS — E1169 Type 2 diabetes mellitus with other specified complication: Secondary | ICD-10-CM

## 2024-01-12 NOTE — Telephone Encounter (Signed)
 Copied from CRM [id number]. Topic: Clinical - Prescription Issue >> Jan 12, 2024  2:05 PM Carlatta H wrote: Reason for CRM: Patient is unable to for TRULICITY 1.5 MG/0.5ML SOAJ [[id number]]//She would like to know what other options she has//Please call patient//

## 2024-01-12 NOTE — Telephone Encounter (Signed)
 Please notify patient that we will need to work with her and her insurance company to identify more cost effective med options for her Diabetes. I am unsure exact reason for the high cost, it is likely that these meds are in a higher Tier for her now, it is listed as Tier 3. I don't see a similar alternative that would be lower cost.  Also, she may have more information for [place] about her insurance if there is a higher deductible or something else that has changed.  I will placed a VBCI referral to clinical pharmacy to ask Estelle Grumbles [facility] CPP to assist further with medication assistance / cost coverage for her so we can get her back on treatment plan.  Her last A1c   Recent Labs    09/23/23 1454  HGBA1C 8.5*   Saralyn Pilar, DO [facility] 01/12/2024, 6:03 PM

## 2024-01-12 NOTE — Addendum Note (Signed)
 Addended by: Smitty Cords on: 01/12/2024 06:03 PM   Modules accepted: Orders

## 2024-01-13 NOTE — Telephone Encounter (Signed)
 Left message for patient to return call.

## 2024-01-13 NOTE — Telephone Encounter (Signed)
 Patient notified that Estelle Grumbles will be in touch with her and trying to make this more affordable

## 2024-01-15 ENCOUNTER — Telehealth: Payer: Self-pay

## 2024-01-15 NOTE — Progress Notes (Signed)
 Care Guide Pharmacy Note  01/15/2024 Name: Andrea Mcdonald MRN: [id number] DOB: [birth year]  Referred By: Smitty Cords, DO Reason for referral: Care Coordination (Outreach to schedule with Pharm d )   Andrea Mcdonald is a 63 y.o. year old female who is a primary care patient of Smitty Cords, DO.  Andrea Mcdonald was referred to the pharmacist for assistance related to: DMII  Successful contact was made with the patient to discuss pharmacy services including being ready for the pharmacist to call at least 5 minutes before the scheduled appointment time and to have medication bottles and any blood pressure readings ready for review. The patient agreed to meet with the pharmacist via telephone visit on (date/time).01/18/2024  Penne Lash , RMA     [place]  [facility] Guide  Direct Dial: [phone]  Website: [email or web address]

## 2024-01-18 ENCOUNTER — Other Ambulatory Visit (HOSPITAL_COMMUNITY): Payer: Self-pay

## 2024-01-18 ENCOUNTER — Other Ambulatory Visit: Admitting: Pharmacist

## 2024-01-18 ENCOUNTER — Other Ambulatory Visit: Payer: Self-pay | Admitting: Pharmacist

## 2024-01-18 ENCOUNTER — Telehealth: Payer: Self-pay

## 2024-01-18 DIAGNOSIS — E119 Type 2 diabetes mellitus without complications: Secondary | ICD-10-CM

## 2024-01-18 DIAGNOSIS — E1169 Type 2 diabetes mellitus with other specified complication: Secondary | ICD-10-CM

## 2024-01-18 MED ORDER — TIRZEPATIDE 2.5 MG/0.5ML ~~LOC~~ SOAJ
2.5000 mg | SUBCUTANEOUS | 0 refills | Status: DC
Start: 2024-01-18 — End: 2024-02-12

## 2024-01-18 NOTE — Telephone Encounter (Signed)
 Pharmacy Patient Advocate Encounter  Insurance verification completed.   The patient is insured through [employer]   Ran test claim for Trulicity. Currently a quantity of 2 is a 28 day supply and the co-pay is $823.49 . The current 28 day co-pay is, $823.49.  No PA needed at this time.  This test claim was processed through [facility]- copay amounts may vary at other pharmacies due to pharmacy/plan contracts, or as the patient moves through the different stages of their insurance plan.

## 2024-01-18 NOTE — Progress Notes (Signed)
 01/18/2024 Name: Andrea Mcdonald MRN: [id number] DOB: 07/09/1961  Chief Complaint  Patient presents with   Medication Management   Medication Assistance    Andrea Mcdonald is a 63 y.o. year old female who presented for a telephone visit.   They were referred to the pharmacist by their PCP for assistance in managing diabetes and medication access.    Subjective:  Care Team: Primary Care Provider: Smitty Cords, DO ; Next Scheduled Visit: 03/22/2024   Medication Access/Adherence  Current Pharmacy:  [facility] [address] [place] - [name] [place]) [place] [address] Phone: [phone] Fax: [phone]   Patient reports affordability concerns with their medications: Yes  Patient reports access/transportation concerns to their pharmacy: No  Patient reports adherence concerns with their medications:  No    Patient shares that the cost of her Trulicity has become unaffordable through her new [employer]  commercial coverage - Reports from review of her insurance card, she has a $7,500 individual deductible  Diabetes:  Current medications:  - metformin ER 500 mg twice daily with meal - Trulicity 1.5 mg weekly on Sundays (reports used up last dose yesterday)  Reports has blood sugar monitor, but denies checking recently  Patient denies hypoglycemic s/sx including dizziness, shakiness, sweating.   Current physical activity: reports limited recently, but planning to start gradual walking (walks with walking stick)  Statin therapy: rosuvastatin 20 mg daily    Objective:  Lab Results  Component Value Date   HGBA1C 8.5 (H) 09/23/2023    Lab Results  Component Value Date   CREATININE 0.86 09/23/2023   BUN 21 09/23/2023   NA 141 09/23/2023   K 4.4 09/23/2023   CL 103 09/23/2023   CO2 30 09/23/2023    Lab Results  Component Value Date   CHOL 147 09/23/2023    HDL 80 09/23/2023   LDLCALC 47 09/23/2023   TRIG 116 09/23/2023   CHOLHDL 1.8 09/23/2023   BP Readings from Last 3 Encounters:  10/26/23 116/70  09/23/23 116/72  06/11/23 116/76   Pulse Readings from Last 3 Encounters:  03/27/23 82  12/25/22 80  12/12/22 89      Medications Reviewed Today     Reviewed by Manuela Neptune, RPH-CPP (Pharmacist) on 01/18/24 at 1338  Med List Status: <None>   Medication Order Taking? Sig Documenting Provider Last Dose Status Informant  gabapentin (NEURONTIN) 300 MG capsule [id number] Yes Take 1 capsule (300 mg total) by mouth 2 (two) times daily. Smitty Cords, DO Taking Active   lisinopril (ZESTRIL) 5 MG tablet [id number] Yes Take 1 tablet (5 mg total) by mouth daily. Smitty Cords, DO Taking Active   meloxicam (MOBIC) 15 MG tablet [id number]  Take 1 tablet (15 mg total) by mouth daily. Karamalegos, Netta Neat, DO  Active   metFORMIN (GLUCOPHAGE-XR) 500 MG 24 hr tablet [id number] Yes Take 1 tablet (500 mg total) by mouth 2 (two) times daily with a meal. Althea Charon, Netta Neat, DO Taking Active   ondansetron (ZOFRAN-ODT) 4 MG disintegrating tablet [id number]  Take 1 tablet (4 mg total) by mouth every 8 (eight) hours as needed for nausea or vomiting. Lorre Munroe, NP  Active   rosuvastatin (CRESTOR) 20 MG tablet [id number] Yes Take 1 tablet (20 mg total) by mouth at bedtime. Smitty Cords, DO Taking Active   sertraline (ZOLOFT) 100 MG tablet [id number] Yes Take 1.5 tablets (150 mg total) by  mouth at bedtime. Smitty Cords, DO Taking Active   TRULICITY 1.5 MG/0.5ML [place] [id number] Yes INJECT 1.5 MG INTO THE SKIN ONCE A WEEK Althea Charon Netta Neat, DO Taking Active               Assessment/Plan:   Collaborate with [facility] [place] to evaluate medication affordability/run test claims. Find that Trulicity cost is unaffordable to patient. However, cost of Mounjaro with manufacturer e-voucher is $25  for 1 month supply.  - Follow up with patient to provide this update.  Patient interested in making change from Trulicity to [facility] for affordability - CPP sends prescription for Mounjaro 2.5 mg weekly to [facility] for patient   Follow up with [facility] and confirm copayment  Diabetes: - Currently uncontrolled - Reviewed long term cardiovascular and renal outcomes of uncontrolled blood sugar - Reviewed goal A1c, goal fasting, and goal 2 hour post prandial glucose - Reviewed dietary modifications including importance of having regular well-balanced meals and snacks throughout the day, while controlling carbohydrate portion sizes - Patient denies personal or family history of multiple endocrine neoplasia type 2, medullary thyroid cancer; personal history of pancreatitis or gallbladder disease - As patient used last dose of Trulicity yesterday, advise patient may start Mounjaro 2.5 mg once weekly next Sunday, 01/24/2024 - Recommend to check glucose, keep log of results and have this record to review at upcoming medical appointments. Patient to contact provider office sooner if needed for readings outside of established parameters or symptoms    Follow Up Plan: Clinical Pharmacist will follow up with patient by telephone on 02/12/2024 at 11:00 AM   Estelle Grumbles, PharmD, [facility] [phone]

## 2024-01-18 NOTE — Patient Instructions (Signed)
 Goals Addressed             This Visit's Progress    Pharmacy Goals       Start Mounjaro 2.5 mg once weekly. This medication may cause stomach upset, queasiness, or constipation, especially when first starting. This generally improves over time. Call our office if these symptoms occur and worsen, or if you have severe symptoms such as vomiting, diarrhea, or stomach pain.   The goal A1c is less than 7%. This is the best way to reduce the risk of the long term complications of diabetes, including heart disease, kidney disease, eye disease, strokes, and nerve damage. An A1c of less than 7% corresponds with fasting sugars less than 130 and 2 hour after meal sugars less than 180.   Our goal bad cholesterol, or LDL, is less than 70 . This is why it is important to continue taking your rosuvastatin.  Estelle Grumbles, PharmD, [place], CPP Clinical Pharmacist [facility] [phone]

## 2024-01-25 ENCOUNTER — Ambulatory Visit (INDEPENDENT_AMBULATORY_CARE_PROVIDER_SITE_OTHER): Admitting: Family Medicine

## 2024-01-25 ENCOUNTER — Ambulatory Visit: Payer: Self-pay | Admitting: Family Medicine

## 2024-01-25 ENCOUNTER — Encounter: Payer: Self-pay | Admitting: Family Medicine

## 2024-01-25 VITALS — BP 134/72 | HR 84 | Ht 66.0 in | Wt 170.0 lb

## 2024-01-25 DIAGNOSIS — K2900 Acute gastritis without bleeding: Secondary | ICD-10-CM

## 2024-01-25 DIAGNOSIS — K219 Gastro-esophageal reflux disease without esophagitis: Secondary | ICD-10-CM

## 2024-01-25 MED ORDER — SUCRALFATE 1 G PO TABS
1.0000 g | ORAL_TABLET | Freq: Three times a day (TID) | ORAL | 2 refills | Status: AC
Start: 2024-01-25 — End: ?

## 2024-01-25 MED ORDER — PANTOPRAZOLE SODIUM 40 MG PO TBEC
40.0000 mg | DELAYED_RELEASE_TABLET | Freq: Every day | ORAL | 2 refills | Status: DC
Start: 2024-01-25 — End: 2024-04-19

## 2024-01-25 NOTE — Telephone Encounter (Signed)
  Chief Complaint: stomach pain  Symptoms: nausea, cramps   Disposition: [] ED /[] Urgent Care (no appt availability in office) / [x] Appointment(In office/virtual)/ []  [facility]/ [] Home Care/ [] Refused Recommended Disposition /[] [place] [facility]/ []  Follow-up with PCP Additional Notes: PT complaining of stomach pain/cramps/nausea for 7-10 days. Pt described the pain as what "hunger pains feel like."  Pt states whole stomach area hurts.  Pt had colon cleanse on 3/26 and has had regular BM since. Pt was constipated before procedure. Pt rates pain 4/10. No vomiting. No fever.  Pt is concerned she has a stomach ulcer. Pt mentioned eating oranges triggers pain. RN advised pt to drink plenty today and not force food but eat bland food if hungry. Pt has appt today with PCP @ 1500. RN gave care advice and pt verbalized understanding.          Copied from CRM [id number]. Topic: Clinical - Red Word Triage >> Jan 25, 2024  9:11 AM Nyra Capes wrote: Red Word that prompted transfer to Nurse Triage: Patient calling in stomach pain, 4 or 5 pain scale right now, patient took Pepto-Bismol last night, generalized stomach pain, a little nauseated and not vomited,  had a hydro  colin cleanse 01/20/24 Reason for Disposition  [1] MODERATE pain (e.g., interferes with normal activities) AND [2] pain comes and goes (cramps) AND [3] present > 24 hours  (Exception: Pain with Vomiting or Diarrhea - see that Guideline.)  Answer Assessment - Initial Assessment Questions 1. LOCATION: "Where does it hurt?"      Middle  2. RADIATION: "Does the pain shoot anywhere else?" (e.g., chest, back)     No  3. ONSET: "When did the pain begin?" (e.g., minutes, hours or days ago)      7-10 days  4. SUDDEN: "Gradual or sudden onset?"     Gradual  5. PATTERN "Does the pain come and go, or is it constant?"    - If it comes and goes: "How long does it last?" "Do you have pain now?"     (Note: Comes and goes means the  pain is intermittent. It goes away completely between bouts.)    - If constant: "Is it getting better, staying the same, or getting worse?"      (Note: Constant means the pain never goes away completely; most serious pain is constant and gets worse.)      Comes and goes  6. SEVERITY: "How bad is the pain?"  (e.g., Scale 1-10; mild, moderate, or severe)    - MILD (1-3): Doesn't interfere with normal activities, abdomen soft and not tender to touch.     - MODERATE (4-7): Interferes with normal activities or awakens from sleep, abdomen tender to touch.     - SEVERE (8-10): Excruciating pain, doubled over, unable to do any normal activities.       4 7. RECURRENT SYMPTOM: "Have you ever had this type of stomach pain before?" If Yes, ask: "When was the last time?" and "What happened that time?"      no 8. CAUSE: "What do you think is causing the stomach pain?"     Possible ulcer  9. RELIEVING/AGGRAVATING FACTORS: "What makes it better or worse?" (e.g., antacids, bending or twisting motion, bowel movement)     Orange made stomach sting 10. OTHER SYMPTOMS: "Do you have any other symptoms?" (e.g., back pain, diarrhea, fever, urination pain, vomiting)       nausea  Protocols used: Abdominal Pain - Female-A-AH

## 2024-01-25 NOTE — Progress Notes (Signed)
 Subjective:    Patient ID: Andrea Mcdonald, female    DOB: [birth year], 63 y.o.   MRN: [id number]  Andrea Mcdonald is a 63 y.o. female presenting on 01/25/2024 for Gastroesophageal Reflux (gastritis)  Patient presents for a same day appointment.  HPI  Discussed the use of AI scribe software for clinical note transcription with the patient, who gave verbal consent to proceed.  History of Present Illness   Andrea Mcdonald is a 63 year old female who presents with abdominal pain and nausea.  She has been experiencing abdominal pain and nausea since before January 16, 2024. The pain is described as sharp, similar to hunger pains, and is located throughout the abdomen with a deeper sensation rather than superficial. Initially, she experienced constipation and underwent a colon cleanse in [place], [place] at location called "[facility]" using enema, which provided some relief, but she remains uncertain if she is completely relieved. She took Pepto-Bismol last night, which improved her symptoms by the morning. No current nausea and normal bowel sounds.  She has been taking meloxicam for shoulder pain for an extended period and completed a steroid pack for her shoulder. During the constipation period, she attempted to relieve her symptoms with Clearlax and magnesium without success. Despite concerns about its potential impact on her stomach, she continues to take meloxicam. She has a prescription for omeprazole but has not been taking it regularly.  She recently started [facility] for diabetes management, having previously been on Ozempic and Trulicity, which were discontinued due to cost. She reports no adverse effects from the [facility], which she began taking the night before this visit.           01/25/2024    3:37 PM 09/23/2023    2:54 PM 03/27/2023    1:20 PM  Depression screen PHQ 2/9  Decreased Interest 1 1 1   Down, Depressed, Hopeless 1 1 2   PHQ - 2 Score 2 2 3   Altered sleeping  1 1 2   Tired, decreased energy 1 2 3   Change in appetite 1 1 2   Feeling bad or failure about yourself  1 2 1   Trouble concentrating 1 2 1   Moving slowly or fidgety/restless 1 1 2   Suicidal thoughts 0 0 0  PHQ-9 Score 8 11 14   Difficult doing work/chores Somewhat difficult Somewhat difficult Very difficult       01/25/2024    3:38 PM 09/23/2023    2:54 PM 03/27/2023    1:20 PM 11/27/2022    9:56 AM  GAD 7 : Generalized Anxiety Score  Nervous, Anxious, on Edge 1 3 3 2   Control/stop worrying 1 3 3 2   Worry too much - different things 1 3 3 2   Trouble relaxing 1 3 3 1   Restless 1 3 3  0  Easily annoyed or irritable 1 2 2  0  Afraid - awful might happen 1 3 0 0  Total GAD 7 Score 7 20 17 7   Anxiety Difficulty Somewhat difficult Very difficult Somewhat difficult Not difficult at all    Social History   Tobacco Use   Smoking status: Never   Smokeless tobacco: Never  Vaping Use   Vaping status: Never Used  Substance Use Topics   Alcohol use: Yes    Comment: drink occ   Drug use: No    Review of Systems Per HPI unless specifically indicated above     Objective:    BP 134/72 (BP Location: Left Arm, Patient Position: Sitting,  Cuff Size: Normal)   Pulse 84   Ht 5\' 6"  (1.676 m)   Wt 170 lb (77.1 kg)   SpO2 99%   BMI 27.44 kg/m   Wt Readings from Last 3 Encounters:  01/25/24 170 lb (77.1 kg)  10/26/23 171 lb 9.6 oz (77.8 kg)  09/23/23 167 lb (75.8 kg)    Physical Exam Vitals and nursing note reviewed.  Constitutional:      General: She is not in acute distress.    Appearance: Normal appearance. She is well-developed. She is not diaphoretic.     Comments: Well-appearing, comfortable, cooperative  HENT:     Head: Normocephalic and atraumatic.  Eyes:     General:        Right eye: No discharge.        Left eye: No discharge.     Conjunctiva/sclera: Conjunctivae normal.  Cardiovascular:     Rate and Rhythm: Normal rate.  Pulmonary:     Effort: Pulmonary effort is  normal.  Abdominal:     General: Bowel sounds are normal. There is no distension.     Palpations: There is no mass.     Tenderness: There is no abdominal tenderness. There is no guarding or rebound.  Skin:    General: Skin is warm and dry.     Findings: No erythema or rash.  Neurological:     Mental Status: She is alert and oriented to person, place, and time.  Psychiatric:        Mood and Affect: Mood normal.        Behavior: Behavior normal.        Thought Content: Thought content normal.     Comments: Well groomed, good eye contact, normal speech and thoughts     Results for orders placed or performed in visit on 10/26/23  Cytology - PAP   Collection Time: 10/26/23  9:48 AM  Result Value Ref Range   High risk HPV Negative    Adequacy Satisfactory for evaluation.    Diagnosis      - Negative for intraepithelial lesion or malignancy (NILM)   Comment Normal Reference Range HPV - Negative       Assessment & Plan:   Problem List Items Addressed This Visit   None Visit Diagnoses       Gastroesophageal reflux disease without esophagitis    -  Primary   Relevant Medications   pantoprazole (PROTONIX) 40 MG tablet   sucralfate (CARAFATE) 1 g tablet     Acute gastritis, presence of bleeding unspecified, unspecified gastritis type            GERD Gastritis with possible ulcer / PUD Presents with sharp, hunger-like abdominal pain and nausea, localized in the upper abdomen. Symptoms improved slightly with Pepto-Bismol. Currently taking meloxicam for shoulder pain, which may contribute to gastritis and potential ulcer formation.  Suspected gastritis, possibly an ulcer, due to meloxicam use and symptomatology.   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 Previously underwent physical therapy. Currently taking gabapentin twice daily and prefers to avoid opioid analgesics. Non-anti-inflammatory pain medication is recommended due to gastrointestinal issues caused by meloxicam. - Continue  gabapentin twice daily - Use acetaminophen for pain management - Avoid anti-inflammatory medications due to gastrointestinal issues  Update Now on Mounjaro for T2DM newly started per clinical pharmacy able to get covered/approved     No orders of the defined types were placed in this encounter.   Meds ordered this encounter  Medications   pantoprazole (PROTONIX) 40 MG tablet    Sig: Take 1 tablet (40 mg total) by mouth daily.    Dispense:  30 tablet    Refill:  2   sucralfate (CARAFATE) 1 g tablet    Sig: Take 1 tablet (1 g total) by mouth 4 (four) times daily -  with meals and at bedtime. As needed for gastritis    Dispense:  60 tablet    Refill:  2    Follow up plan: Return if symptoms worsen or fail to improve.   Saralyn Pilar, DO [facility] 01/25/2024, 3:14 PM

## 2024-01-25 NOTE — Patient Instructions (Addendum)
 Thank you for coming to the office today.  Start Pantoprazole 40mg  daily on empty stomach, before 1st meal daily for 4-8 weeks then may continue longer if needed. Can consider taper to 20mg  Omeprazole in future.  Take carafate with meal or before bedtime as needed for symptoms   Please schedule a Follow-up Appointment to: Return if symptoms worsen or fail to improve.  If you have any other questions or concerns, please feel free to call the office or send a message through MyChart. You may also schedule an earlier appointment if necessary.  Additionally, you may be receiving a survey about your experience at our office within a few days to 1 week by e-mail or mail. We value your feedback.  Saralyn Pilar, DO [facility], [place]

## 2024-02-01 ENCOUNTER — Other Ambulatory Visit: Payer: Self-pay | Admitting: Family Medicine

## 2024-02-01 DIAGNOSIS — E1169 Type 2 diabetes mellitus with other specified complication: Secondary | ICD-10-CM

## 2024-02-01 DIAGNOSIS — I10 Essential (primary) hypertension: Secondary | ICD-10-CM

## 2024-02-02 NOTE — Telephone Encounter (Signed)
 Requested Prescriptions  Pending Prescriptions Disp Refills   lisinopril (ZESTRIL) 5 MG tablet [Pharmacy Med Name: Lisinopril 5 MG Oral Tablet] 90 tablet 0    Sig: Take 1 tablet by mouth once daily     Cardiovascular:  ACE Inhibitors Failed - 02/02/2024  1:35 PM      Failed - Valid encounter within last 6 months    Recent Outpatient Visits           1 week ago Gastroesophageal reflux disease without esophagitis   [facility] [place], Netta Neat, DO       Future Appointments             In 1 month Althea Charon, Netta Neat, DO [facility]            Passed - Cr in normal range and within 180 days    Creat  Date Value Ref Range Status  09/23/2023 0.86 0.50 - 1.05 mg/dL Final   Creatinine, Urine  Date Value Ref Range Status  09/23/2023 179 20 - 275 mg/dL Final         Passed - K in normal range and within 180 days    Potassium  Date Value Ref Range Status  09/23/2023 4.4 3.5 - 5.3 mmol/L Final  10/29/2014 4.5 3.5 - 5.1 mmol/L Final         Passed - Patient is not pregnant      Passed - Last BP in normal range    BP Readings from Last 1 Encounters:  01/25/24 134/72

## 2024-02-12 ENCOUNTER — Other Ambulatory Visit (INDEPENDENT_AMBULATORY_CARE_PROVIDER_SITE_OTHER): Payer: Self-pay | Admitting: Pharmacist

## 2024-02-12 DIAGNOSIS — Z7984 Long term (current) use of oral hypoglycemic drugs: Secondary | ICD-10-CM

## 2024-02-12 DIAGNOSIS — E1169 Type 2 diabetes mellitus with other specified complication: Secondary | ICD-10-CM

## 2024-02-12 DIAGNOSIS — E119 Type 2 diabetes mellitus without complications: Secondary | ICD-10-CM

## 2024-02-12 DIAGNOSIS — Z7985 Long-term (current) use of injectable non-insulin antidiabetic drugs: Secondary | ICD-10-CM

## 2024-02-12 MED ORDER — TIRZEPATIDE 2.5 MG/0.5ML ~~LOC~~ SOAJ: 2.5000 mg | SUBCUTANEOUS | 0 refills | Status: DC

## 2024-02-12 NOTE — Progress Notes (Signed)
 02/12/2024 Name: Andrea Mcdonald MRN: [id number] DOB: 22-Mar-1961  Chief Complaint  Patient presents with   Medication Assistance   Medication Management    Andrea Mcdonald is a 63 y.o. year old female who presented for a telephone visit.   They were referred to the pharmacist by their PCP for assistance in managing diabetes and medication access.      Subjective:   Care Team: Primary Care Provider: Raina Bunting, DO ; Next Scheduled Visit: 03/22/2024  Medication Access/Adherence  Current Pharmacy:  [facility] [address] ([address]),  - [address] [place]) [place] [address] Phone: [phone] Fax: [phone]   Patient reports affordability concerns with their medications: No  Patient reports access/transportation concerns to their pharmacy: No  Patient reports adherence concerns with their medications:  No       Diabetes:   Current medications:  - metformin  ER 500 mg twice daily with meal - Mounjaro 2.5 mg weekly on Sundays (started 01/24/2024)  Reports tolerating well   Reports has blood sugar monitor, but denies checking recently  Reports has noticed improvement in appetite control since started [facility]   Patient denies hypoglycemic s/sx including dizziness, shakiness, sweating.    Current physical activity: reports limited currently, but planning to start gradual walking (walks with walking stick)   Statin therapy: rosuvastatin  20 mg daily   Objective:  Lab Results  Component Value Date   HGBA1C 8.5 (H) 09/23/2023    Lab Results  Component Value Date   CREATININE 0.86 09/23/2023   BUN 21 09/23/2023   NA 141 09/23/2023   K 4.4 09/23/2023   CL 103 09/23/2023   CO2 30 09/23/2023    Lab Results  Component Value Date   CHOL 147 09/23/2023   HDL 80 09/23/2023   LDLCALC 47 09/23/2023   TRIG 116 09/23/2023   CHOLHDL 1.8 09/23/2023    Medications Reviewed Today     Reviewed  by Ardis Becton, RPH-CPP (Pharmacist) on 02/12/24 at 1153  Med List Status: <None>   Medication Order Taking? Sig Documenting Provider Last Dose Status Informant  gabapentin  (NEURONTIN ) 300 MG capsule [id number]  Take 1 capsule (300 mg total) by mouth 2 (two) times daily. Raina Bunting, DO  Active   lisinopril  (ZESTRIL ) 5 MG tablet [id number]  Take 1 tablet by mouth once daily Raina Bunting, DO  Active   meloxicam  (MOBIC ) 15 MG tablet [id number]  Take 1 tablet (15 mg total) by mouth daily. Karamalegos, Kayleen Party, DO  Active   metFORMIN  (GLUCOPHAGE -XR) 500 MG 24 hr tablet [id number] Yes Take 1 tablet (500 mg total) by mouth 2 (two) times daily with a meal. Romeo Co, Kayleen Party, DO Taking Active   ondansetron  (ZOFRAN -ODT) 4 MG disintegrating tablet [id number]  Take 1 tablet (4 mg total) by mouth every 8 (eight) hours as needed for nausea or vomiting. Carollynn Cirri, NP  Active   pantoprazole  (PROTONIX ) 40 MG tablet [id number]  Take 1 tablet (40 mg total) by mouth daily. Raina Bunting, DO  Active   rosuvastatin  (CRESTOR ) 20 MG tablet [id number]  Take 1 tablet (20 mg total) by mouth at bedtime. Raina Bunting, DO  Active   sertraline  (ZOLOFT ) 100 MG tablet [id number]  Take 1.5 tablets (150 mg total) by mouth at bedtime. Raina Bunting, DO  Active   sucralfate  (CARAFATE ) 1 g tablet [id number]  Take 1 tablet (1 g total) by mouth 4 (four) times daily -  with meals and at bedtime. As needed for gastritis Raina Bunting, DO  Active   tirzepatide [facility]) 2.5 MG/0.5ML Pen [id number] Yes Inject 2.5 mg into the skin once a week. Raina Bunting, DO Taking Active               Assessment/Plan:   Diabetes: - Currently uncontrolled based on latest A1C result - Reviewed long term cardiovascular and renal outcomes of uncontrolled blood sugar - Reviewed goal A1c, goal fasting, and goal 2 hour post prandial glucose - Reviewed  dietary modifications including importance of having regular well-balanced meals and snacks throughout the day, while controlling carbohydrate portion sizes - Patient denies personal or family history of multiple endocrine neoplasia type 2, medullary thyroid cancer; personal history of pancreatitis or gallbladder disease - Discuss diabetes medication management options. Patient preference to continue on current dose of Mounjaro for now as tolerating well and noticing positive impact on appetite control. Agree to discuss again next month  CPP sends renewal for Mounjaro 2.5 mg weekly to pharmacy for patient - Recommend to start to check glucose, keep log of results and have this record to review at upcoming medical appointments. Patient to contact provider office sooner if needed for readings outside of established parameters or symptoms       Follow Up Plan: Clinical Pharmacist will follow up with patient by telephone on 03/07/2024 at 11:00 AM     Arthur Lash, PharmD, [facility] [phone]

## 2024-02-12 NOTE — Patient Instructions (Signed)
 Goals Addressed             This Visit's Progress    Pharmacy Goals       The goal A1c is less than 7%. This is the best way to reduce the risk of the long term complications of diabetes, including heart disease, kidney disease, eye disease, strokes, and nerve damage. An A1c of less than 7% corresponds with fasting sugars less than 130 and 2 hour after meal sugars less than 180.   Our goal bad cholesterol, or LDL, is less than 70 . This is why it is important to continue taking your rosuvastatin .   Arthur Lash, PharmD, Becky Bowels, CPP Clinical Pharmacist [facility] [phone]

## 2024-02-24 ENCOUNTER — Telehealth: Payer: Self-pay

## 2024-02-24 ENCOUNTER — Other Ambulatory Visit (HOSPITAL_COMMUNITY): Payer: Self-pay

## 2024-02-24 NOTE — Telephone Encounter (Addendum)
 Andrea Mcdonald ([place]) Rx #: [id number] Mounjaro 2.5MG /0.5ML auto-injectors Form [employer] [place] [employer] Electronic Request Form Sent to plan  Approved 02/24/24-02/23/25

## 2024-02-24 NOTE — Telephone Encounter (Signed)
 Copied from CRM [id number]. Topic: Clinical - Prescription Issue >> Feb 24, 2024  3:37 PM Phil Braun wrote: Reason for CRM:   Ref: tirzepatide (MOUNJARO) 2.5 MG/0.5ML Pen  Pt gets assistance to get medication for 25.00 she went today to get it and they say ins denied payment and whatever assistance she was getting they didn't apply it to this order. She asked that [facility] check into this for her and to give her a call.

## 2024-02-24 NOTE — Telephone Encounter (Signed)
 Pharmacy Patient Advocate Encounter  Insurance verification completed.   The patient is insured through [employer]   Ran test claim for [employer]. Currently a quantity of 2ml is a 28 day supply and the co-pay is $100.19   This test claim was processed through [facility]- copay amounts may vary at other pharmacies due to pharmacy/plan contracts, or as the patient moves through the different stages of their insurance plan.

## 2024-02-25 ENCOUNTER — Other Ambulatory Visit (HOSPITAL_COMMUNITY): Payer: Self-pay

## 2024-02-25 ENCOUNTER — Other Ambulatory Visit: Payer: Self-pay | Admitting: Pharmacist

## 2024-02-25 DIAGNOSIS — E1169 Type 2 diabetes mellitus with other specified complication: Secondary | ICD-10-CM

## 2024-02-25 DIAGNOSIS — E119 Type 2 diabetes mellitus without complications: Secondary | ICD-10-CM

## 2024-02-25 NOTE — Patient Instructions (Signed)
 Goals Addressed             This Visit's Progress    Pharmacy Goals       The goal A1c is less than 7%. This is the best way to reduce the risk of the long term complications of diabetes, including heart disease, kidney disease, eye disease, strokes, and nerve damage. An A1c of less than 7% corresponds with fasting sugars less than 130 and 2 hour after meal sugars less than 180.   Our goal bad cholesterol, or LDL, is less than 70 . This is why it is important to continue taking your rosuvastatin .   Arthur Lash, PharmD, Becky Bowels, CPP Clinical Pharmacist [facility] [phone]

## 2024-02-25 NOTE — Progress Notes (Signed)
 02/25/2024 Name: Andrea Mcdonald MRN: [id number] DOB: April 26, 1961  Chief Complaint  Patient presents with   Medication Management   Medication Assistance    Andrea Mcdonald is a 63 y.o. year old female who presented for a telephone visit.   They were referred to the pharmacist by their PCP for assistance in managing diabetes and medication access.   Receive a message from office advising patient contacted office regarding cost barrier for [facility]     Subjective:   Care Team: Primary Care Provider: Raina Bunting, DO ; Next Scheduled Visit: 03/22/2024  Medication Access/Adherence  Current Pharmacy:  [facility] [address] ([address]), [place] - [address] [place]) [place] [address] Phone: [phone] Fax: [phone]   Patient reports affordability concerns with their medications: No  Patient reports access/transportation concerns to their pharmacy: No  Patient reports adherence concerns with their medications:  No     Outreach to [employer] on behalf of patient. Speak with Vicky who advises patient's insurance is rejecting second month fill of Mounjaro 2.5 mg strength. Rejection indicates max 1 month supply fill of strength per 180 days  Collaborate with CPhT Trenton Frock to run test claims for patient. Find that cost of Mounjaro is now unaffordable for patient as Mounjaro e-voucher savings from manufacturer has reached annual maximum and appears patient has >$3000 of her insurance deductible remaining - Provide update to patient who advises current costs for GLP-1 receptor agonist therapy options are unaffordable   Diabetes:   Current medications:  - metformin  ER 500 mg twice daily with meal - Reports took last dose of Mounjaro 2.5 mg last Friday   Previous therapies tried: Failed Victoza, Tresiba , Farxiga ; Trulicity  (cost), Mounjaro (cost)  Reports recent morning fasting blood sugar readings ranging  130-145   Patient denies hypoglycemic s/sx including dizziness, shakiness, sweating.    Current physical activity: reports limited currently related to shoulder pain, but planning to start gradual walking or chair yoga   Statin therapy: rosuvastatin  20 mg daily   Objective:  Lab Results  Component Value Date   HGBA1C 8.5 (H) 09/23/2023    Lab Results  Component Value Date   CREATININE 0.86 09/23/2023   BUN 21 09/23/2023   NA 141 09/23/2023   K 4.4 09/23/2023   CL 103 09/23/2023   CO2 30 09/23/2023    Lab Results  Component Value Date   CHOL 147 09/23/2023   HDL 80 09/23/2023   LDLCALC 47 09/23/2023   TRIG 116 09/23/2023   CHOLHDL 1.8 09/23/2023    Medications Reviewed Today   Medications were not reviewed in this encounter       Assessment/Plan:   Diabetes: - Uncontrolled based on latest A1C result - Reviewed long term cardiovascular and renal outcomes of uncontrolled blood sugar - Reviewed goal A1c, goal fasting, and goal 2 hour post prandial glucose - Reviewed dietary modifications including importance of having regular well-balanced meals and snacks throughout the day, while controlling carbohydrate portion sizes  Discuss ideas for balanced snacks - Discuss diabetes medication management options - Advise patient that she may increase dose of her metformin  ER 500 mg to 1 tablet daily with breakfast and 2 tablets daily with supper for 1 week and then, if tolerating well, may increase to 2 tablets twice daily  Patient verbalizes understanding of plan via teach back  Denies need for new prescription at this time as prefers to first use up supply at home -  Based on reported income, patient would meet criteria for patient assistance for Januvia from [employer] with PCP regarding plan to start patient on Januvia 100 mg daily. If provider agrees, will collaborate with PCP and CPhT to aid patient with applying for Januvia patient assistance - Recommend to  check glucose, keep log of results and have this record to review at upcoming medical appointments. Patient to contact provider office sooner if needed for readings outside of established parameters or symptoms       Follow Up Plan: Clinical Pharmacist will follow up with patient by telephone on 03/07/2024 at 11:00 AM     Arthur Lash, PharmD, [facility] [phone]

## 2024-02-26 ENCOUNTER — Telehealth: Payer: Self-pay

## 2024-02-26 DIAGNOSIS — E1169 Type 2 diabetes mellitus with other specified complication: Secondary | ICD-10-CM

## 2024-02-26 NOTE — Progress Notes (Signed)
 Patient meets financial criteria for Januvia patient assistance program through [employer]. Will collaborate with provider, CPhT, and patient to pursue assistance.  Arthur Lash, PharmD, Becky Bowels, CPP Clinical Pharmacist [facility] [phone]

## 2024-02-26 NOTE — Telephone Encounter (Signed)
 PAP: Patient assistance application for Januvia through [employer] has been mailed to pt's home address on file. Provider portion of application will be faxed to provider's office.

## 2024-02-29 NOTE — Telephone Encounter (Signed)
 Received Provider portion of Pap for Januvia [employer])- will contact Patient later to ensure she received PAP application in the mail.

## 2024-03-07 ENCOUNTER — Other Ambulatory Visit: Admitting: Pharmacist

## 2024-03-07 ENCOUNTER — Encounter: Payer: Self-pay | Admitting: Pharmacist

## 2024-03-07 DIAGNOSIS — E1169 Type 2 diabetes mellitus with other specified complication: Secondary | ICD-10-CM

## 2024-03-07 DIAGNOSIS — E119 Type 2 diabetes mellitus without complications: Secondary | ICD-10-CM

## 2024-03-07 NOTE — Progress Notes (Unsigned)
 03/07/2024 Name: Andrea Mcdonald MRN: [id number] DOB: 1961/02/03  Chief Complaint  Patient presents with   Medication Management   Medication Assistance   Medication Adherence    Andrea Mcdonald is a 63 y.o. year old female who presented for a telephone visit.   They were referred to the pharmacist by their PCP for assistance in managing diabetes and medication access.    Subjective:  Care Team: Primary Care Provider: Raina Bunting, DO ; Next Scheduled Visit: 03/22/2024   Medication Access/Adherence  Current Pharmacy:  [facility] [address] ([address]), [place] - [address] [place] ([place]) [place] [address] Phone: [phone] Fax: [phone]   Patient reports affordability concerns with their medications: No  Patient reports access/transportation concerns to their pharmacy: No  Patient reports adherence concerns with their medications:  No     Today patient shares that she thinks that she is going to need to change her PCP. Reports recently received a bill from her insurance since she was last seen and    Diabetes:   Current medications:  - metformin  ER 500 mg - 1 tablet daily with breakfast and 2 tablets daily with supper (increased to current dose on 5/1) Reports has noticed having some diarrhea since increase to current dose, but shares not sure if also related to recent dietary changes   Previous therapies tried: Failed Victoza, Tresiba , Farxiga ; Trulicity  (cost), Mounjaro (cost)   Reports recent blood sugar readings throughout the day ranging 117-235 *Attributes higher readings to dietary choices (larger portions of sweets)   Patient denies hypoglycemic s/sx including dizziness, shakiness, sweating.    Current physical activity: reports limited currently related to shoulder pain, but planning to start gradual walking or chair yoga   Statin therapy: rosuvastatin  20 mg daily   Objective:  Lab Results   Component Value Date   HGBA1C 8.5 (H) 09/23/2023    Lab Results  Component Value Date   CREATININE 0.86 09/23/2023   BUN 21 09/23/2023   NA 141 09/23/2023   K 4.4 09/23/2023   CL 103 09/23/2023   CO2 30 09/23/2023    Lab Results  Component Value Date   CHOL 147 09/23/2023   HDL 80 09/23/2023   LDLCALC 47 09/23/2023   TRIG 116 09/23/2023   CHOLHDL 1.8 09/23/2023    Medications Reviewed Today   Medications were not reviewed in this encounter       Assessment/Plan:   Diabetes: - Uncontrolled based on latest A1C result - Reviewed long term cardiovascular and renal outcomes of uncontrolled blood sugar - Reviewed goal A1c, goal fasting, and goal 2 hour post prandial glucose - Reviewed dietary modifications including importance of having regular well-balanced meals and snacks throughout the day, while controlling carbohydrate portion sizes             Discuss ideas for balanced snacks - Discuss diabetes medication management options - Patient plans to continue metformin  ER 500 mg to 1 tablet daily with breakfast and 2 tablets daily with supper for now             Patient plans to work on making dietary changes  -Collaborating with PCP and CPhT to aid patient with applying for Januvia patient assistance from [employer] patient on how to complete attestation letter when received from [employer] - Recommend to check glucose, keep log of results and have this record to review at upcoming medical appointments. Patient to contact provider office sooner if  needed for readings outside of established parameters or symptoms       Follow Up Plan:   Patient denies further medication questions or concerns today Provide patient with contact information for clinic pharmacist to contact if needed in future for medication questions/concerns   Arthur Lash, PharmD, [facility] [phone]

## 2024-03-08 NOTE — Patient Instructions (Signed)
 Goals Addressed             This Visit's Progress    Pharmacy Goals       The goal A1c is less than 7%. This is the best way to reduce the risk of the long term complications of diabetes, including heart disease, kidney disease, eye disease, strokes, and nerve damage. An A1c of less than 7% corresponds with fasting sugars less than 130 and 2 hour after meal sugars less than 180.   Our goal bad cholesterol, or LDL, is less than 70 . This is why it is important to continue taking your rosuvastatin .   Arthur Lash, PharmD, Becky Bowels, CPP Clinical Pharmacist [facility] [phone]

## 2024-03-09 NOTE — Telephone Encounter (Signed)
 Reached out to patient to verify receiving PAP application for Januvia ([employer]) in the mail- left HIPAA compliant V/M to return call if assistance needed.

## 2024-03-10 NOTE — Telephone Encounter (Signed)
 PAP: Application for Januvia has been submitted to [employer], via fax with tax documents.

## 2024-03-22 ENCOUNTER — Ambulatory Visit: Payer: Self-pay | Admitting: Family Medicine

## 2024-03-22 NOTE — Telephone Encounter (Signed)
 Reached out to [employer] and a Research scientist (medical) was mailed out to the patient.  Checked with the patient and she did receive the letter and just returned it to [employer] by mail.  Will follow up soon to verify pap approval.

## 2024-03-30 ENCOUNTER — Other Ambulatory Visit (INDEPENDENT_AMBULATORY_CARE_PROVIDER_SITE_OTHER): Payer: Self-pay | Admitting: Pharmacist

## 2024-03-30 DIAGNOSIS — E1169 Type 2 diabetes mellitus with other specified complication: Secondary | ICD-10-CM

## 2024-03-30 DIAGNOSIS — Z7984 Long term (current) use of oral hypoglycemic drugs: Secondary | ICD-10-CM

## 2024-03-30 DIAGNOSIS — E119 Type 2 diabetes mellitus without complications: Secondary | ICD-10-CM

## 2024-03-30 NOTE — Patient Instructions (Signed)
 Goals Addressed             This Visit's Progress    Pharmacy Goals       The goal A1c is less than 7%. This is the best way to reduce the risk of the long term complications of diabetes, including heart disease, kidney disease, eye disease, strokes, and nerve damage. An A1c of less than 7% corresponds with fasting sugars less than 130 and 2 hour after meal sugars less than 180.   Our goal bad cholesterol, or LDL, is less than 70 . This is why it is important to continue taking your rosuvastatin .   Arthur Lash, PharmD, Becky Bowels, CPP Clinical Pharmacist [facility] [phone]

## 2024-03-30 NOTE — Progress Notes (Signed)
 03/30/2024 Name: Andrea Mcdonald MRN: [id number] DOB: [birth year]  Chief Complaint  Patient presents with   Medication Assistance   Medication Management    Andrea Mcdonald is a 63 y.o. year old female who was referred to the pharmacist by their PCP for assistance in managing diabetes and medication access.   Receive a voicemail from patient requesting a call back. Return call to patient.     Subjective:   Care Team: Primary Care Provider: Raina Bunting, DO  Medication Access/Adherence  Current Pharmacy:  [facility] [address]), [place] - [address] [place]) [place] [address] Phone: [phone] Fax: [phone]   Patient reports affordability concerns with their medications: No  Patient reports access/transportation concerns to their pharmacy: No  Patient reports adherence concerns with their medications:  No     Patient has shared that she is going to change her PCP as current PCP is no longer in network. States has scheduled appointment with new PCP in July, but trying to obtain sooner appointment   Diabetes:   Current medications:  - metformin  ER 500 mg - 2 tablets twice daily  Tolerating well. Attributes previous stomach upset to tolerating milk   Previous therapies tried: Failed Victoza, Tresiba , Farxiga ; Trulicity  (cost), Mounjaro (cost)  Reports received Januvia in the mail from [employer] patient assistance program yesterday and plans to start taking today   Denies checking home blood sugar recently   Patient denies hypoglycemic s/sx including dizziness, shakiness, sweating.   Statin therapy: rosuvastatin  20 mg daily  Current Medication Access Support: enrolled in patient assistance for Januvia from [employer]  Objective:  Lab Results  Component Value Date   HGBA1C 8.5 (H) 09/23/2023    Lab Results  Component Value Date   CREATININE 0.86 09/23/2023   BUN 21 09/23/2023   NA 141  09/23/2023   K 4.4 09/23/2023   CL 103 09/23/2023   CO2 30 09/23/2023     Current Outpatient Medications on File Prior to Visit  Medication Sig Dispense Refill   metFORMIN  (GLUCOPHAGE -XR) 500 MG 24 hr tablet Take 1 tablet (500 mg total) by mouth 2 (two) times daily with a meal. (Patient taking differently: Take 1,000 mg by mouth 2 (two) times daily with a meal.) 180 tablet 3   gabapentin  (NEURONTIN ) 300 MG capsule Take 1 capsule (300 mg total) by mouth 2 (two) times daily. 180 capsule 3   JANUVIA 100 MG tablet Take 1 tablet (100 mg total) by mouth daily.     lisinopril  (ZESTRIL ) 5 MG tablet Take 1 tablet by mouth once daily 90 tablet 0   meloxicam  (MOBIC ) 15 MG tablet Take 1 tablet (15 mg total) by mouth daily. 90 tablet 3   ondansetron  (ZOFRAN -ODT) 4 MG disintegrating tablet Take 1 tablet (4 mg total) by mouth every 8 (eight) hours as needed for nausea or vomiting. 30 tablet 0   pantoprazole  (PROTONIX ) 40 MG tablet Take 1 tablet (40 mg total) by mouth daily. 30 tablet 2   rosuvastatin  (CRESTOR ) 20 MG tablet Take 1 tablet (20 mg total) by mouth at bedtime. 90 tablet 3   sertraline  (ZOLOFT ) 100 MG tablet Take 1.5 tablets (150 mg total) by mouth at bedtime. 135 tablet 3   sucralfate  (CARAFATE ) 1 g tablet Take 1 tablet (1 g total) by mouth 4 (four) times daily -  with meals and at bedtime. As needed for gastritis 60 tablet 2   No current facility-administered medications on  file prior to visit.        Assessment/Plan:   Diabetes: - Uncontrolled based on latest A1C result - Reviewed long term cardiovascular and renal outcomes of uncontrolled blood sugar - Reviewed goal A1c, goal fasting, and goal 2 hour post prandial glucose - Address patient's questions regarding monitoring with Januvia - Reviewed dietary modifications including importance of having regular well-balanced meals and snacks throughout the day, while controlling carbohydrate portion sizes - Recommend to restart checking  glucose, keep log of results and have this record to review at upcoming medical appointments. Patient to contact provider office sooner if needed for readings outside of established parameters or symptoms     Follow Up Plan:    Patient denies further medication questions or concerns today. No follow up appointment currently scheduled as patient planning to change primary care providers   Provide patient with contact information for clinic pharmacist to contact if needed in future for medication questions/concerns   Arthur Lash, PharmD, [facility] [phone]

## 2024-04-04 NOTE — Progress Notes (Signed)
 Pharmacy Medication Assistance Program Note    04/04/2024  Patient ID: DENIM START, female   DOB: 03-25-1961, 63 y.o.   MRN: [id number]     04/04/2024  Outreach Medication One  Initial Outreach Date (Medication One) 03/22/2024  Manufacturer Medication One [employer]  [employer] Januvia  Dose of Januvia 100 mg  Type of Radiographer, therapeutic Assistance  Date Application Sent to Patient 03/17/2024  Application Items Requested Application  Date Application Sent to Prescriber 03/17/2024  Name of Prescriber Domingo Friend  Date Application Received From Patient 03/25/2024  Application Items Received From Patient Application;Proof of Income  Date Application Received From Provider 03/22/2024  Date Application Submitted to Manufacturer 03/25/2024  Method Application Sent to Manufacturer Fax  Patient Assistance Determination Approved  Approval End Date 10/26/2024   Patient has received her medication

## 2024-04-04 NOTE — Telephone Encounter (Signed)
 PAP: Patient assistance application for Januvia has been approved by PAP Companies: [employer] from 04/01/2024 to 10/26/2024. Medication should be delivered to PAP Delivery: home. For further shipping updates, please contact [employer] at [phone]. Patient ID is: not provided Patient has received her medication.

## 2024-04-17 ENCOUNTER — Other Ambulatory Visit: Payer: Self-pay | Admitting: Family Medicine

## 2024-04-17 DIAGNOSIS — K219 Gastro-esophageal reflux disease without esophagitis: Secondary | ICD-10-CM

## 2024-04-19 NOTE — Telephone Encounter (Signed)
 OV  01/25/24 Requested Prescriptions  Pending Prescriptions Disp Refills   pantoprazole  (PROTONIX ) 40 MG tablet [Pharmacy Med Name: Pantoprazole  Sodium 40 MG Oral Tablet Delayed Release] 30 tablet 0    Sig: Take 1 tablet by mouth once daily     Gastroenterology: Proton Pump Inhibitors Failed - 04/19/2024  2:44 PM      Failed - Valid encounter within last 12 months    Recent Outpatient Visits           2 months ago Gastroesophageal reflux disease without esophagitis   [place] [facility] [place], Marsa PARAS, [place]

## 2024-05-16 ENCOUNTER — Other Ambulatory Visit: Payer: Self-pay | Admitting: Family Medicine

## 2024-05-16 DIAGNOSIS — K219 Gastro-esophageal reflux disease without esophagitis: Secondary | ICD-10-CM

## 2024-05-18 NOTE — Telephone Encounter (Signed)
 Last OV in June 2025.  Requested Prescriptions  Pending Prescriptions Disp Refills   pantoprazole  (PROTONIX ) 40 MG tablet [Pharmacy Med Name: Pantoprazole  Sodium 40 MG Oral Tablet Delayed Release] 90 tablet 0    Sig: Take 1 tablet by mouth once daily     Gastroenterology: Proton Pump Inhibitors Failed - 05/18/2024  9:08 AM      Failed - Valid encounter within last 12 months    Recent Outpatient Visits           3 months ago Gastroesophageal reflux disease without esophagitis   [place] [facility] [place], Marsa PARAS, [place]

## 2024-05-26 ENCOUNTER — Other Ambulatory Visit: Payer: Self-pay | Admitting: Family Medicine

## 2024-05-26 DIAGNOSIS — Z1231 Encounter for screening mammogram for malignant neoplasm of breast: Secondary | ICD-10-CM

## 2024-07-08 ENCOUNTER — Ambulatory Visit
Admission: RE | Admit: 2024-07-08 | Discharge: 2024-07-08 | Disposition: A | Discharge status: Home / Self Care | Source: Ambulatory Visit | Attending: Family Medicine | Admitting: Family Medicine

## 2024-07-08 DIAGNOSIS — Z1231 Encounter for screening mammogram for malignant neoplasm of breast: Secondary | ICD-10-CM | POA: Diagnosis present

## 2024-07-27 ENCOUNTER — Telehealth: Payer: Self-pay | Admitting: Neurosurgery

## 2024-07-27 NOTE — Telephone Encounter (Signed)
 Patient went to ED in [facility] for some recent back pain she's been having. She was advised to follow up with our office but would like to know if we can refer her to another clinic, such as [facility], due to her insurance being out of network with [facility].   Please advise

## 2024-09-21 ENCOUNTER — Other Ambulatory Visit: Payer: Self-pay | Admitting: Family Medicine

## 2024-09-21 DIAGNOSIS — I10 Essential (primary) hypertension: Secondary | ICD-10-CM

## 2024-09-21 DIAGNOSIS — E1169 Type 2 diabetes mellitus with other specified complication: Secondary | ICD-10-CM

## 2024-09-26 NOTE — Telephone Encounter (Signed)
 Unable to refill per protocol, appointment needed.   Requested Prescriptions  Pending Prescriptions Disp Refills   metFORMIN  (GLUCOPHAGE -XR) 500 MG 24 hr tablet [Pharmacy Med Name: metFORMIN  HCl ER 500 MG Oral Tablet Extended Release 24 Hour] 180 tablet 0    Sig: TAKE 1 TABLET BY MOUTH TWICE DAILY WITH A MEAL     Endocrinology:  Diabetes - Biguanides Failed - 09/26/2024  1:55 PM      Failed - Cr in normal range and within 360 days    Creat  Date Value Ref Range Status  09/23/2023 0.86 0.50 - 1.05 mg/dL Final   Creatinine, Urine  Date Value Ref Range Status  09/23/2023 179 20 - 275 mg/dL Final         Failed - HBA1C is between 0 and 7.9 and within 180 days    Hgb A1c MFr Bld  Date Value Ref Range Status  09/23/2023 8.5 (H) <5.7 % of total Hgb Final    Comment:    For someone without known diabetes, a hemoglobin A1c value of 6.5% or greater indicates that they may have  diabetes and this should be confirmed with a follow-up  test. . For someone with known diabetes, a value <7% indicates  that their diabetes is well controlled and a value  greater than or equal to 7% indicates suboptimal  control. A1c targets should be individualized based on  duration of diabetes, age, comorbid conditions, and  other considerations. . Currently, no consensus exists regarding use of hemoglobin A1c for diagnosis of diabetes for children. .    A1c  Date Value Ref Range Status  03/27/2023 7.5  Final    Comment:    Abstracted by HIM         Failed - eGFR in normal range and within 360 days    GFR, Est African American  Date Value Ref Range Status  07/23/2020 87 > OR = 60 mL/min/1.33m2 Final   GFR, Est Non African American  Date Value Ref Range Status  07/23/2020 75 > OR = 60 mL/min/1.41m2 Final   GFR, Estimated  Date Value Ref Range Status  08/18/2022 >60 >60 mL/min Final    Comment:    (NOTE) Calculated using the CKD-EPI Creatinine Equation (2021)    eGFR  Date Value Ref  Range Status  09/23/2023 76 > OR = 60 mL/min/1.78m2 Final         Failed - B12 Level in normal range and within 720 days    Vitamin B-12  Date Value Ref Range Status  07/16/2018 309 200 - 1,100 pg/mL Final    Comment:    . Please Note: Although the reference range for vitamin B12 is [phone] pg/mL, it has been reported that between 5 and 10% of patients with values between 200 and 400 pg/mL may experience neuropsychiatric and hematologic abnormalities due to occult B12 deficiency; less than 1% of patients with values above 400 pg/mL will have symptoms. .          Failed - Valid encounter within last 6 months    Recent Outpatient Visits           8 months ago Gastroesophageal reflux disease without esophagitis   [place] [facility] [place], [place] PARAS, [place]              Failed - CBC within normal limits and completed in the last 12 months    WBC  Date Value Ref Range Status  09/23/2023 8.4 3.8 -  10.8 Thousand/uL Final   RBC  Date Value Ref Range Status  09/23/2023 4.60 3.80 - 5.10 Million/uL Final   Hemoglobin  Date Value Ref Range Status  09/23/2023 12.7 11.7 - 15.5 g/dL Final  97/85/7982 87.7 11.1 - 15.9 g/dL Final   HCT  Date Value Ref Range Status  09/23/2023 39.9 35.0 - 45.0 % Final   Hematocrit  Date Value Ref Range Status  12/11/2015 36.4 34.0 - 46.6 % Final   MCHC  Date Value Ref Range Status  09/23/2023 31.8 (L) 32.0 - 36.0 g/dL Final    Comment:    For adults, a slight decrease in the calculated MCHC value (in the range of 30 to 32 g/dL) is most likely not clinically significant; however, it should be interpreted with caution in correlation with other red cell parameters and the patient's clinical condition.    [facility]  Date Value Ref Range Status  09/23/2023 27.6 27.0 - 33.0 pg Final   MCV  Date Value Ref Range Status  09/23/2023 86.7 80.0 - 100.0 fL Final  12/11/2015 85 79 - 97 fL Final  10/29/2014 88 80 - 100 fL  Final   No results found for: PLTCOUNTKUC, LABPLAT, POCPLA RDW  Date Value Ref Range Status  09/23/2023 13.2 11.0 - 15.0 % Final  12/11/2015 14.0 12.3 - 15.4 % Final  10/29/2014 13.6 11.5 - 14.5 % Final          lisinopril  (ZESTRIL ) 5 MG tablet [Pharmacy Med Name: Lisinopril  5 MG Oral Tablet] 90 tablet 0    Sig: Take 1 tablet by mouth once daily     Cardiovascular:  ACE Inhibitors Failed - 09/26/2024  1:55 PM      Failed - Cr in normal range and within 180 days    Creat  Date Value Ref Range Status  09/23/2023 0.86 0.50 - 1.05 mg/dL Final   Creatinine, Urine  Date Value Ref Range Status  09/23/2023 179 20 - 275 mg/dL Final         Failed - K in normal range and within 180 days    Potassium  Date Value Ref Range Status  09/23/2023 4.4 3.5 - 5.3 mmol/L Final  10/29/2014 4.5 3.5 - 5.1 mmol/L Final         Failed - Valid encounter within last 6 months    Recent Outpatient Visits           8 months ago Gastroesophageal reflux disease without esophagitis   [place] [facility] [place] PARAS, [place] - Patient is not pregnant      Passed - Last BP in normal range    BP Readings from Last 1 Encounters:  01/25/24 134/72

## 2024-09-28 ENCOUNTER — Other Ambulatory Visit: Payer: Self-pay | Admitting: Family Medicine

## 2024-09-28 DIAGNOSIS — E1169 Type 2 diabetes mellitus with other specified complication: Secondary | ICD-10-CM

## 2024-09-28 DIAGNOSIS — I10 Essential (primary) hypertension: Secondary | ICD-10-CM

## 2024-09-29 LAB — OPHTHALMOLOGY REPORT-SCANNED

## 2024-09-30 NOTE — Telephone Encounter (Signed)
 Requested medication (s) are due for refill today: yes  Requested medication (s) are on the active medication list: yes  Last refill:  09/22/24  Future visit scheduled: yes  Notes to clinic:  Unable to refill per protocol, courtesy refill already given, routing for provider approval.      Requested Prescriptions  Pending Prescriptions Disp Refills   metFORMIN  (GLUCOPHAGE -XR) 500 MG 24 hr tablet [Pharmacy Med Name: metFORMIN  HCl ER 500 MG Oral Tablet Extended Release 24 Hour] 180 tablet 0    Sig: TAKE 1 TABLET BY MOUTH TWICE DAILY WITH A MEAL     Endocrinology:  Diabetes - Biguanides Failed - 09/30/2024  4:16 PM      Failed - Cr in normal range and within 360 days    Creat  Date Value Ref Range Status  09/23/2023 0.86 0.50 - 1.05 mg/dL Final   Creatinine, Urine  Date Value Ref Range Status  09/23/2023 179 20 - 275 mg/dL Final         Failed - HBA1C is between 0 and 7.9 and within 180 days    Hgb A1c MFr Bld  Date Value Ref Range Status  09/23/2023 8.5 (H) <5.7 % of total Hgb Final    Comment:    For someone without known diabetes, a hemoglobin A1c value of 6.5% or greater indicates that they may have  diabetes and this should be confirmed with a follow-up  test. . For someone with known diabetes, a value <7% indicates  that their diabetes is well controlled and a value  greater than or equal to 7% indicates suboptimal  control. A1c targets should be individualized based on  duration of diabetes, age, comorbid conditions, and  other considerations. . Currently, no consensus exists regarding use of hemoglobin A1c for diagnosis of diabetes for children. .    A1c  Date Value Ref Range Status  03/27/2023 7.5  Final    Comment:    Abstracted by HIM         Failed - eGFR in normal range and within 360 days    GFR, Est African American  Date Value Ref Range Status  07/23/2020 87 > OR = 60 mL/min/1.80m2 Final   GFR, Est Non African American  Date Value Ref Range  Status  07/23/2020 75 > OR = 60 mL/min/1.84m2 Final   GFR, Estimated  Date Value Ref Range Status  08/18/2022 >60 >60 mL/min Final    Comment:    (NOTE) Calculated using the CKD-EPI Creatinine Equation (2021)    eGFR  Date Value Ref Range Status  09/23/2023 76 > OR = 60 mL/min/1.27m2 Final         Failed - B12 Level in normal range and within 720 days    Vitamin B-12  Date Value Ref Range Status  07/16/2018 309 200 - 1,100 pg/mL Final    Comment:    . Please Note: Although the reference range for vitamin B12 is [phone] pg/mL, it has been reported that between 5 and 10% of patients with values between 200 and 400 pg/mL may experience neuropsychiatric and hematologic abnormalities due to occult B12 deficiency; less than 1% of patients with values above 400 pg/mL will have symptoms. .          Failed - Valid encounter within last 6 months    Recent Outpatient Visits           8 months ago Gastroesophageal reflux disease without esophagitis   [place] [facility]  [place], Marsa PARAS, DO              Failed - CBC within normal limits and completed in the last 12 months    WBC  Date Value Ref Range Status  09/23/2023 8.4 3.8 - 10.8 Thousand/uL Final   RBC  Date Value Ref Range Status  09/23/2023 4.60 3.80 - 5.10 Million/uL Final   Hemoglobin  Date Value Ref Range Status  09/23/2023 12.7 11.7 - 15.5 g/dL Final  97/85/7982 87.7 11.1 - 15.9 g/dL Final   HCT  Date Value Ref Range Status  09/23/2023 39.9 35.0 - 45.0 % Final   Hematocrit  Date Value Ref Range Status  12/11/2015 36.4 34.0 - 46.6 % Final   MCHC  Date Value Ref Range Status  09/23/2023 31.8 (L) 32.0 - 36.0 g/dL Final    Comment:    For adults, a slight decrease in the calculated MCHC value (in the range of 30 to 32 g/dL) is most likely not clinically significant; however, it should be interpreted with caution in correlation with other red cell parameters and the  patient's clinical condition.    [facility]  Date Value Ref Range Status  09/23/2023 27.6 27.0 - 33.0 pg Final   MCV  Date Value Ref Range Status  09/23/2023 86.7 80.0 - 100.0 fL Final  12/11/2015 85 79 - 97 fL Final  10/29/2014 88 80 - 100 fL Final   No results found for: PLTCOUNTKUC, LABPLAT, POCPLA RDW  Date Value Ref Range Status  09/23/2023 13.2 11.0 - 15.0 % Final  12/11/2015 14.0 12.3 - 15.4 % Final  10/29/2014 13.6 11.5 - 14.5 % Final          lisinopril  (ZESTRIL ) 5 MG tablet [Pharmacy Med Name: Lisinopril  5 MG Oral Tablet] 90 tablet 0    Sig: Take 1 tablet by mouth once daily     Cardiovascular:  ACE Inhibitors Failed - 09/30/2024  4:16 PM      Failed - Cr in normal range and within 180 days    Creat  Date Value Ref Range Status  09/23/2023 0.86 0.50 - 1.05 mg/dL Final   Creatinine, Urine  Date Value Ref Range Status  09/23/2023 179 20 - 275 mg/dL Final         Failed - K in normal range and within 180 days    Potassium  Date Value Ref Range Status  09/23/2023 4.4 3.5 - 5.3 mmol/L Final  10/29/2014 4.5 3.5 - 5.1 mmol/L Final         Failed - Valid encounter within last 6 months    Recent Outpatient Visits           8 months ago Gastroesophageal reflux disease without esophagitis   [place] [facility] [place], [place] - Patient is not pregnant      Passed - Last BP in normal range    BP Readings from Last 1 Encounters:  01/25/24 134/72

## 2024-10-11 ENCOUNTER — Encounter: Payer: Self-pay | Admitting: Neurosurgery

## 2024-10-11 ENCOUNTER — Ambulatory Visit: Admitting: Neurosurgery

## 2024-10-11 ENCOUNTER — Other Ambulatory Visit (INDEPENDENT_AMBULATORY_CARE_PROVIDER_SITE_OTHER)

## 2024-10-11 VITALS — BP 126/74 | Ht 65.0 in | Wt 180.1 lb

## 2024-10-11 DIAGNOSIS — Z981 Arthrodesis status: Secondary | ICD-10-CM

## 2024-10-11 DIAGNOSIS — R200 Anesthesia of skin: Secondary | ICD-10-CM

## 2024-10-11 DIAGNOSIS — M4316 Spondylolisthesis, lumbar region: Secondary | ICD-10-CM

## 2024-10-11 DIAGNOSIS — M549 Dorsalgia, unspecified: Secondary | ICD-10-CM

## 2024-10-11 DIAGNOSIS — M79606 Pain in leg, unspecified: Secondary | ICD-10-CM

## 2024-10-11 MED ORDER — GABAPENTIN 300 MG PO CAPS: 300.0000 mg | ORAL_CAPSULE | Freq: Three times a day (TID) | ORAL | 0 refills | Status: DC

## 2024-10-11 NOTE — Addendum Note (Signed)
 Addended by: GIRARD DON GAILS on: 10/11/2024 04:35 PM   Modules accepted: Orders

## 2024-10-11 NOTE — Progress Notes (Signed)
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  She has been having some back pain.  She went to the emergency department recently.  She had some left leg pain and started physical therapy.  That has improved somewhat, but now she is having right leg discomfort that goes down her leg to her lateral calf towards her ankle.  This is bothering her considerably.   PHYSICAL EXAMINATION:  General: Patient is well developed, well nourished, calm, collected, and in no apparent distress.   NEUROLOGICAL:  General: In no acute distress.     Strength:            Side Iliopsoas Quads Hamstring PF DF EHL  R 5 5 5 5 5 5   L 5 5 5 5 5 5    Incision c/d/i   ROS (Neurologic):  Negative except as noted above  IMAGING: No complications noted on prior imaging  ASSESSMENT/PLAN:  Andrea Mcdonald is doing fair.  She is having new symptoms of back and leg pain.  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  We will get flexion-extension x-rays today and obtain an MRI scan of the lumbar spine.  If that is completely clear, she may need a nerve conduction study.    I will increase her gabapentin  to 300 mg 3 times daily.  I have asked her to contact me in 7 to 10 days to let me know how she is doing so that we can increase her dosage if needed.  I spent a total of 20 minutes in this patient's care today. This time was spent reviewing pertinent records including imaging studies, obtaining and confirming history, performing a directed evaluation, formulating and discussing my recommendations, and documenting the visit within the medical record.   Andrea Daisy MD Department of neurosurgery

## 2024-10-15 ENCOUNTER — Other Ambulatory Visit: Payer: Self-pay | Admitting: Family Medicine

## 2024-10-15 DIAGNOSIS — F3342 Major depressive disorder, recurrent, in full remission: Secondary | ICD-10-CM

## 2024-10-15 DIAGNOSIS — E1169 Type 2 diabetes mellitus with other specified complication: Secondary | ICD-10-CM

## 2024-10-19 NOTE — Telephone Encounter (Signed)
 Requested medication (s) are due for refill today: historical provider  Requested medication (s) are on the active medication list: yes   Last refill:  02/07/24  Future visit scheduled: yes 11/07/24  Notes to clinic:  historical provider. Do you want to order Rx?     Requested Prescriptions  Pending Prescriptions Disp Refills   metFORMIN  (GLUCOPHAGE -XR) 500 MG 24 hr tablet [Pharmacy Med Name: metFORMIN  HCl ER 500 MG Oral Tablet Extended Release 24 Hour] 180 tablet 0    Sig: TAKE 1 TABLET BY MOUTH TWICE DAILY WITH A MEAL     Endocrinology:  Diabetes - Biguanides Failed - 10/19/2024  9:30 AM      Failed - Cr in normal range and within 360 days    Creat  Date Value Ref Range Status  09/23/2023 0.86 0.50 - 1.05 mg/dL Final   Creatinine, Urine  Date Value Ref Range Status  09/23/2023 179 20 - 275 mg/dL Final         Failed - HBA1C is between 0 and 7.9 and within 180 days    Hgb A1c MFr Bld  Date Value Ref Range Status  09/23/2023 8.5 (H) <5.7 % of total Hgb Final    Comment:    For someone without known diabetes, a hemoglobin A1c value of 6.5% or greater indicates that they may have  diabetes and this should be confirmed with a follow-up  test. . For someone with known diabetes, a value <7% indicates  that their diabetes is well controlled and a value  greater than or equal to 7% indicates suboptimal  control. A1c targets should be individualized based on  duration of diabetes, age, comorbid conditions, and  other considerations. . Currently, no consensus exists regarding use of hemoglobin A1c for diagnosis of diabetes for children. .    A1c  Date Value Ref Range Status  03/27/2023 7.5  Final    Comment:    Abstracted by HIM         Failed - eGFR in normal range and within 360 days    GFR, Est African American  Date Value Ref Range Status  07/23/2020 87 > OR = 60 mL/min/1.82m2 Final   GFR, Est Non African American  Date Value Ref Range Status  07/23/2020 75 >  OR = 60 mL/min/1.14m2 Final   GFR, Estimated  Date Value Ref Range Status  08/18/2022 >60 >60 mL/min Final    Comment:    (NOTE) Calculated using the CKD-EPI Creatinine Equation (2021)    eGFR  Date Value Ref Range Status  09/23/2023 76 > OR = 60 mL/min/1.85m2 Final         Failed - B12 Level in normal range and within 720 days    Vitamin B-12  Date Value Ref Range Status  07/16/2018 309 200 - 1,100 pg/mL Final    Comment:    . Please Note: Although the reference range for vitamin B12 is [phone] pg/mL, it has been reported that between 5 and 10% of patients with values between 200 and 400 pg/mL may experience neuropsychiatric and hematologic abnormalities due to occult B12 deficiency; less than 1% of patients with values above 400 pg/mL will have symptoms. .          Failed - Valid encounter within last 6 months    Recent Outpatient Visits           8 months ago Gastroesophageal reflux disease without esophagitis   [facility] [place], Marsa PARAS,  DO              Failed - CBC within normal limits and completed in the last 12 months    WBC  Date Value Ref Range Status  09/23/2023 8.4 3.8 - 10.8 Thousand/uL Final   RBC  Date Value Ref Range Status  09/23/2023 4.60 3.80 - 5.10 Million/uL Final   Hemoglobin  Date Value Ref Range Status  09/23/2023 12.7 11.7 - 15.5 g/dL Final  97/85/7982 87.7 11.1 - 15.9 g/dL Final   HCT  Date Value Ref Range Status  09/23/2023 39.9 35.0 - 45.0 % Final   Hematocrit  Date Value Ref Range Status  12/11/2015 36.4 34.0 - 46.6 % Final   MCHC  Date Value Ref Range Status  09/23/2023 31.8 (L) 32.0 - 36.0 g/dL Final    Comment:    For adults, a slight decrease in the calculated MCHC value (in the range of 30 to 32 g/dL) is most likely not clinically significant; however, it should be interpreted with caution in correlation with other red cell parameters and the patient's  clinical condition.    [facility]  Date Value Ref Range Status  09/23/2023 27.6 27.0 - 33.0 pg Final   MCV  Date Value Ref Range Status  09/23/2023 86.7 80.0 - 100.0 fL Final  12/11/2015 85 79 - 97 fL Final  10/29/2014 88 80 - 100 fL Final   No results found for: PLTCOUNTKUC, LABPLAT, POCPLA RDW  Date Value Ref Range Status  09/23/2023 13.2 11.0 - 15.0 % Final  12/11/2015 14.0 12.3 - 15.4 % Final  10/29/2014 13.6 11.5 - 14.5 % Final         Signed Prescriptions Disp Refills   sertraline  (ZOLOFT ) 100 MG tablet 135 tablet 0    Sig: TAKE 1 & 1/2 (ONE & ONE-HALF) TABLETS BY MOUTH AT BEDTIME     Psychiatry:  Antidepressants - SSRI - sertraline  Failed - 10/19/2024  9:30 AM      Failed - AST in normal range and within 360 days    AST  Date Value Ref Range Status  09/23/2023 22 10 - 35 U/L Final   SGOT(AST)  Date Value Ref Range Status  10/29/2014 20 15 - 37 Unit/L Final         Failed - ALT in normal range and within 360 days    ALT  Date Value Ref Range Status  09/23/2023 27 6 - 29 U/L Final   SGPT (ALT)  Date Value Ref Range Status  10/29/2014 27 U/L Final    Comment:    14-63 NOTE: New Reference Range 05/16/14          Failed - Valid encounter within last 6 months    Recent Outpatient Visits           8 months ago Gastroesophageal reflux disease without esophagitis   [facility] [place], Marsa PARAS, DO              Passed - Completed PHQ-2 or PHQ-9 in the last 360 days

## 2024-10-19 NOTE — Telephone Encounter (Signed)
 Requested by interface surescripts. Future visit 11/07/24. Courtesy refill.  Requested Prescriptions  Pending Prescriptions Disp Refills   metFORMIN  (GLUCOPHAGE -XR) 500 MG 24 hr tablet [Pharmacy Med Name: metFORMIN  HCl ER 500 MG Oral Tablet Extended Release 24 Hour] 180 tablet 0    Sig: TAKE 1 TABLET BY MOUTH TWICE DAILY WITH A MEAL     Endocrinology:  Diabetes - Biguanides Failed - 10/19/2024  9:30 AM      Failed - Cr in normal range and within 360 days    Creat  Date Value Ref Range Status  09/23/2023 0.86 0.50 - 1.05 mg/dL Final   Creatinine, Urine  Date Value Ref Range Status  09/23/2023 179 20 - 275 mg/dL Final         Failed - HBA1C is between 0 and 7.9 and within 180 days    Hgb A1c MFr Bld  Date Value Ref Range Status  09/23/2023 8.5 (H) <5.7 % of total Hgb Final    Comment:    For someone without known diabetes, a hemoglobin A1c value of 6.5% or greater indicates that they may have  diabetes and this should be confirmed with a follow-up  test. . For someone with known diabetes, a value <7% indicates  that their diabetes is well controlled and a value  greater than or equal to 7% indicates suboptimal  control. A1c targets should be individualized based on  duration of diabetes, age, comorbid conditions, and  other considerations. . Currently, no consensus exists regarding use of hemoglobin A1c for diagnosis of diabetes for children. .    A1c  Date Value Ref Range Status  03/27/2023 7.5  Final    Comment:    Abstracted by HIM         Failed - eGFR in normal range and within 360 days    GFR, Est African American  Date Value Ref Range Status  07/23/2020 87 > OR = 60 mL/min/1.27m2 Final   GFR, Est Non African American  Date Value Ref Range Status  07/23/2020 75 > OR = 60 mL/min/1.32m2 Final   GFR, Estimated  Date Value Ref Range Status  08/18/2022 >60 >60 mL/min Final    Comment:    (NOTE) Calculated using the CKD-EPI Creatinine Equation (2021)     eGFR  Date Value Ref Range Status  09/23/2023 76 > OR = 60 mL/min/1.15m2 Final         Failed - B12 Level in normal range and within 720 days    Vitamin B-12  Date Value Ref Range Status  07/16/2018 309 200 - 1,100 pg/mL Final    Comment:    . Please Note: Although the reference range for vitamin B12 is [phone] pg/mL, it has been reported that between 5 and 10% of patients with values between 200 and 400 pg/mL may experience neuropsychiatric and hematologic abnormalities due to occult B12 deficiency; less than 1% of patients with values above 400 pg/mL will have symptoms. .          Failed - Valid encounter within last 6 months    Recent Outpatient Visits           8 months ago Gastroesophageal reflux disease without esophagitis   [place] [facility] [place], Marsa PARAS, DO              Failed - CBC within normal limits and completed in the last 12 months    WBC  Date Value Ref Range Status  09/23/2023 8.4  3.8 - 10.8 Thousand/uL Final   RBC  Date Value Ref Range Status  09/23/2023 4.60 3.80 - 5.10 Million/uL Final   Hemoglobin  Date Value Ref Range Status  09/23/2023 12.7 11.7 - 15.5 g/dL Final  97/85/7982 87.7 11.1 - 15.9 g/dL Final   HCT  Date Value Ref Range Status  09/23/2023 39.9 35.0 - 45.0 % Final   Hematocrit  Date Value Ref Range Status  12/11/2015 36.4 34.0 - 46.6 % Final   MCHC  Date Value Ref Range Status  09/23/2023 31.8 (L) 32.0 - 36.0 g/dL Final    Comment:    For adults, a slight decrease in the calculated MCHC value (in the range of 30 to 32 g/dL) is most likely not clinically significant; however, it should be interpreted with caution in correlation with other red cell parameters and the patient's clinical condition.    [facility]  Date Value Ref Range Status  09/23/2023 27.6 27.0 - 33.0 pg Final   MCV  Date Value Ref Range Status  09/23/2023 86.7 80.0 - 100.0 fL Final  12/11/2015 85 79 - 97 fL Final   10/29/2014 88 80 - 100 fL Final   No results found for: PLTCOUNTKUC, LABPLAT, POCPLA RDW  Date Value Ref Range Status  09/23/2023 13.2 11.0 - 15.0 % Final  12/11/2015 14.0 12.3 - 15.4 % Final  10/29/2014 13.6 11.5 - 14.5 % Final          sertraline  (ZOLOFT ) 100 MG tablet [Pharmacy Med Name: Sertraline  HCl 100 MG Oral Tablet] 135 tablet 0    Sig: TAKE 1 & 1/2 (ONE & ONE-HALF) TABLETS BY MOUTH AT BEDTIME     Psychiatry:  Antidepressants - SSRI - sertraline  Failed - 10/19/2024  9:30 AM      Failed - AST in normal range and within 360 days    AST  Date Value Ref Range Status  09/23/2023 22 10 - 35 U/L Final   SGOT(AST)  Date Value Ref Range Status  10/29/2014 20 15 - 37 Unit/L Final         Failed - ALT in normal range and within 360 days    ALT  Date Value Ref Range Status  09/23/2023 27 6 - 29 U/L Final   SGPT (ALT)  Date Value Ref Range Status  10/29/2014 27 U/L Final    Comment:    14-63 NOTE: New Reference Range 05/16/14          Failed - Valid encounter within last 6 months    Recent Outpatient Visits           8 months ago Gastroesophageal reflux disease without esophagitis   [place] [facility] [place], Marsa PARAS, DO              Passed - Completed PHQ-2 or PHQ-9 in the last 360 days

## 2024-11-07 ENCOUNTER — Ambulatory Visit: Admitting: Family Medicine

## 2024-11-07 ENCOUNTER — Encounter: Payer: Self-pay | Admitting: Family Medicine

## 2024-11-07 VITALS — BP 110/76 | HR 71 | Ht 65.0 in | Wt 188.0 lb

## 2024-11-07 DIAGNOSIS — E785 Hyperlipidemia, unspecified: Secondary | ICD-10-CM

## 2024-11-07 DIAGNOSIS — G8929 Other chronic pain: Secondary | ICD-10-CM

## 2024-11-07 DIAGNOSIS — E1169 Type 2 diabetes mellitus with other specified complication: Secondary | ICD-10-CM

## 2024-11-07 DIAGNOSIS — K219 Gastro-esophageal reflux disease without esophagitis: Secondary | ICD-10-CM

## 2024-11-07 DIAGNOSIS — M48062 Spinal stenosis, lumbar region with neurogenic claudication: Secondary | ICD-10-CM

## 2024-11-07 DIAGNOSIS — F3342 Major depressive disorder, recurrent, in full remission: Secondary | ICD-10-CM

## 2024-11-07 DIAGNOSIS — Z981 Arthrodesis status: Secondary | ICD-10-CM

## 2024-11-07 DIAGNOSIS — I10 Essential (primary) hypertension: Secondary | ICD-10-CM

## 2024-11-07 DIAGNOSIS — Z7984 Long term (current) use of oral hypoglycemic drugs: Secondary | ICD-10-CM

## 2024-11-07 DIAGNOSIS — M5441 Lumbago with sciatica, right side: Secondary | ICD-10-CM

## 2024-11-07 LAB — POCT GLYCOSYLATED HEMOGLOBIN (HGB A1C): Hemoglobin A1C: 9.2 % — AB (ref 4.0–5.6)

## 2024-11-07 MED ORDER — SERTRALINE HCL 100 MG PO TABS: 150.0000 mg | ORAL_TABLET | Freq: Every day | ORAL | 11 refills | Status: DC

## 2024-11-07 MED ORDER — ROSUVASTATIN CALCIUM 20 MG PO TABS: 20.0000 mg | ORAL_TABLET | Freq: Every day | ORAL | 11 refills | Status: DC

## 2024-11-07 MED ORDER — LISINOPRIL 5 MG PO TABS: 5.0000 mg | ORAL_TABLET | Freq: Every day | ORAL | 11 refills | Status: DC

## 2024-11-07 NOTE — Progress Notes (Unsigned)
 "  Subjective:    Patient ID: Andrea Mcdonald, female    DOB: 12/25/1960, 64 y.o.   MRN: [id number]  Andrea Mcdonald is a 64 y.o. female presenting on 11/07/2024 for Gastroesophageal Reflux and Diabetes   HPI  Discussed the use of AI scribe software for clinical note transcription with the patient, who gave verbal consent to proceed.  History of Present Illness   Andrea Mcdonald is a 64 year old female with diabetes who presents for a follow-up visit.  Chronic low back and hip pain - History of lumbar spinal fusion - Chronic low back pain with left hip involvement - Resumed meloxicam  for back and hip pain - Scheduled for MRI of lumbar spine - Pain sometimes causes loss of balance - Gabapentin  dosage increased to three times daily  Left Hip and Low Back Pain Back on Meloxicam  15mg  daily Upcoming MRI Lumbar Spine  Dr Clois [facility] - On Gabapentin  300mg  THREE TIMES A DAY now (prior 2 a day)  Diabetes mellitus management - Current medications: metformin  1000 mg twice daily, Januvia 100 mg daily (obtained through financial assistance program) - A1c last recorded at 9.2, previously 7.5 and 8.5 - Increased sugar cravings and nighttime snacking - Weight gain of ten pounds - Nighttime cravings often triggered by being awakened by her dog, with snacks including pretzels and cheeses       Episodic Abdominal Pain GERD Since March 2025, has continued with [facility] PCP and they continued it Previously concern PUD due to NSAID meloxicam  On Pantoprazole  40mg  daily has resolved symptoms Hold off on GI referral        11/07/2024    2:50 PM 01/25/2024    3:37 PM 09/23/2023    2:54 PM  Depression screen PHQ 2/9  Decreased Interest 1 1 1   Down, Depressed, Hopeless 1 1 1   PHQ - 2 Score 2 2 2   Altered sleeping 1 1 1   Tired, decreased energy 1 1 2   Change in appetite 1 1 1   Feeling bad or failure about yourself  1 1 2   Trouble concentrating 1 1 2    Moving slowly or fidgety/restless 1 1 1   Suicidal thoughts 1 0 0  PHQ-9 Score 9 8  11    Difficult doing work/chores Not difficult at all Somewhat difficult Somewhat difficult     Data saved with a previous flowsheet row definition       01/25/2024    3:38 PM 09/23/2023    2:54 PM 03/27/2023    1:20 PM 11/27/2022    9:56 AM  GAD 7 : Generalized Anxiety Score  Nervous, Anxious, on Edge 1 3 3 2   Control/stop worrying 1 3 3 2   Worry too much - different things 1 3 3 2   Trouble relaxing 1 3 3 1   Restless 1 3 3  0  Easily annoyed or irritable 1 2 2  0  Afraid - awful might happen 1 3 0 0  Total GAD 7 Score 7 20 17 7   Anxiety Difficulty Somewhat difficult Very difficult Somewhat difficult Not difficult at all    Social History[1]  Review of Systems Per HPI unless specifically indicated above     Objective:    BP 110/76 (BP Location: Right Arm, Patient Position: Sitting, Cuff Size: Normal)   Pulse 71   Ht 5' 5 (1.651 m)   Wt 188 lb (85.3 kg)   SpO2 99%   BMI 31.28 kg/m   Wt Readings from Last  3 Encounters:  11/07/24 188 lb (85.3 kg)  10/11/24 180 lb 2 oz (81.7 kg)  01/25/24 170 lb (77.1 kg)    Physical Exam Vitals and nursing note reviewed.  Constitutional:      General: She is not in acute distress.    Appearance: She is well-developed. She is not diaphoretic.     Comments: Well-appearing, comfortable, cooperative  HENT:     Head: Normocephalic and atraumatic.  Eyes:     General:        Right eye: No discharge.        Left eye: No discharge.     Conjunctiva/sclera: Conjunctivae normal.  Neck:     Thyroid: No thyromegaly.  Cardiovascular:     Rate and Rhythm: Normal rate and regular rhythm.     Heart sounds: Normal heart sounds. No murmur heard. Pulmonary:     Effort: Pulmonary effort is normal. No respiratory distress.     Breath sounds: Normal breath sounds. No wheezing or rales.  Musculoskeletal:        General: Normal range of motion.     Cervical back:  Normal range of motion and neck supple.  Lymphadenopathy:     Cervical: No cervical adenopathy.  Skin:    General: Skin is warm and dry.     Findings: No erythema or rash.  Neurological:     Mental Status: She is alert and oriented to person, place, and time.  Psychiatric:        Behavior: Behavior normal.     Comments: Well groomed, good eye contact, normal speech and thoughts     I have personally reviewed the radiology report from 10/11/24 on Lumbar X-ray.  DG Lumbar Spine Complete Order: [id number] Details  Reading Physician Reading Date Result Priority  Andrea Andrea NOVAK, MD [phone]  10/17/2024    Narrative & Impression CLINICAL DATA:  Back and leg pain. Status post lumbar fusion. Leg numbness. Spondylolisthesis of lumbar region.   EXAM: LUMBAR SPINE - COMPLETE 4+ VIEW   COMPARISON:  Lumbar radiograph 06/11/2023   FINDINGS: Five non-rib-bearing lumbar vertebra. Posterior L4-L5 fusion with interbody spacer. The hardware is intact. 8 mm anterolisthesis of L5 on S1 is unchanged on flexion and extension. 4-5 mm retrolisthesis of L2 on L3 and 3 mm retrolisthesis of L3 on L4, also unchanged on flexion and extension. Mild rightward lean of the upper lumbar spine. No fracture or compression deformity. L2-L3 disc space narrowing and spurring. L5-S1 facet hypertrophy. The sacroiliac joints are congruent.   IMPRESSION: 1. Posterior L4-L5 fusion with interbody spacer. The hardware is intact. 2. Unchanged anterolisthesis of L5 on S1 and retrolisthesis of L2 on L3 and L3 on L4. No change in alignment on flexion or extension. 3. L2-L3 degenerative disc disease. L5-S1 facet hypertrophy.     Electronically Signed   By: Andrea Andrea M.D.   On: 10/17/2024 17:36    Results for orders placed or performed in visit on 11/07/24  POCT glycosylated hemoglobin (Hb A1C)   Collection Time: 11/07/24  2:55 PM  Result Value Ref Range   Hemoglobin A1C 9.2 (A) 4.0 - 5.6 %       Assessment & Plan:   Problem List Items Addressed This Visit     Chronic bilateral low back pain with right-sided sciatica   Relevant Medications   sertraline  (ZOLOFT ) 100 MG tablet   Essential hypertension   Relevant Medications   lisinopril  (ZESTRIL ) 5 MG tablet   rosuvastatin  (CRESTOR ) 20 MG tablet  Hyperlipidemia associated with type 2 diabetes mellitus (HCC)   Relevant Medications   lisinopril  (ZESTRIL ) 5 MG tablet   rosuvastatin  (CRESTOR ) 20 MG tablet   Other Relevant Orders   CT CARDIAC SCORING (SELF PAY ONLY)   Lumbar stenosis with neurogenic claudication   Major depression, recurrent, full remission   Relevant Medications   sertraline  (ZOLOFT ) 100 MG tablet   S/P lumbar fusion   Type 2 diabetes mellitus with other specified complication (HCC) - Primary   Relevant Medications   lisinopril  (ZESTRIL ) 5 MG tablet   rosuvastatin  (CRESTOR ) 20 MG tablet   Other Relevant Orders   POCT glycosylated hemoglobin (Hb A1C) (Completed)   CT CARDIAC SCORING (SELF PAY ONLY)   Other Visit Diagnoses       Gastroesophageal reflux disease without esophagitis            Type 2 diabetes mellitus with elevated A1c / Hyperlipidemia A1c at 9.2 indicates poor glycemic control. Insurance issues with Januvia coverage noted. - Continue metformin  1000 mg twice daily. - Continue Januvia 100 mg daily through existing supply. - Request new order for Januvia through pharmacy, noting it as a lower tier preferred medicine. - In future may refer to [facility] for financial assistance if insurance does not cover Januvia. - Ordered A1c test today. - Encouraged lifestyle modifications including increased water intake and healthier eating habits.  Chronic low back pain with prior lumbar fusion Chronic low back pain managed with gabapentin . MRI of lumbar spine scheduled. - Continue gabapentin  three times daily. - Proceed with MRI of lumbar spine as scheduled. - Follow up with Dr. Clois for  ongoing management.  Gastroesophageal reflux disease GERD managed with pantoprazole  and sucralfate . No recent flare-ups. Discussed potential risk of symptoms returning due to meloxicam  use. - Continue pantoprazole  daily. - Use sucralfate  as needed. - Paused GI referral unless symptoms recur or new symptoms develop.  Essential hypertension - Continue lisinopril  as prescribed.  Major depressive disorder, in remission Major depressive disorder in remission on sertraline . - Continue sertraline  as prescribed.  General health maintenance Discussed pneumonia vaccination and heart scan due to family history. - Recommended pneumonia vaccine (Prevnar 20) at pharmacy or during next visit. - Ordered heart scan to check for arterial buildup or blockage.       Orders Placed This Encounter  Procedures   CT CARDIAC SCORING (SELF PAY ONLY)    Standing Status:   Future    Expiration Date:   11/07/2025    Preferred imaging location?:   [facility]   POCT glycosylated hemoglobin (Hb A1C)    Meds ordered this encounter  Medications   lisinopril  (ZESTRIL ) 5 MG tablet    Sig: Take 1 tablet (5 mg total) by mouth daily.    Dispense:  30 tablet    Refill:  11   rosuvastatin  (CRESTOR ) 20 MG tablet    Sig: Take 1 tablet (20 mg total) by mouth at bedtime.    Dispense:  30 tablet    Refill:  11    Add future refills   sertraline  (ZOLOFT ) 100 MG tablet    Sig: Take 1.5 tablets (150 mg total) by mouth at bedtime.    Dispense:  45 tablet    Refill:  11    Follow up plan: Return for 3 month fasting lab > 1 week later Annual Physical.  Future labs ordered for 01/2025   Marsa Officer, DO [facility] 11/07/2024, 2:24 PM     [  1]  Social History Tobacco Use   Smoking status: Never   Smokeless tobacco: Never  Vaping Use   Vaping status: Never Used  Substance Use Topics   Alcohol use: Yes    Comment: drink occ   Drug use: No   "

## 2024-11-07 NOTE — Patient Instructions (Addendum)
 Thank you for coming to the office today.  Recommend Pneumonia vaccine Prevnar-20 here or at the pharmacy, 1 shot lasts for 5 years or more.  Refilled Lisinopril , Rosuvastatin , and Sertraline .  Current meds should have refills.  We will pause on a GI referral, since the medicine is working. Keep in touch if you have more flare ups or other symptoms.  Keep up with Dr Clois on the MRI and follow up plans.  Gabapentin  is now 300mg  3 times a day  A1c today ______   Rosine have been referred for a Coronary Calcium  Score Cardiac CT Scan. This is a screening test for patients aged 103-50+ with cardiovascular risk factors or who are healthy but would be interested in Cardiovascular Screening for heart disease. Even if there is a family history of heart disease, this imaging can be useful. Typically it can be done every 5+ years or at a different timeline we agree on  The scan will look at the chest and mainly focus on the heart and identify early signs of calcium  build up or blockages within the heart arteries. It is not 100% accurate for identifying blockages or heart disease, but it is useful to help us  predict who may have some early changes or be at risk in the future for a heart attack or cardiovascular problem.  The results are reviewed by a Cardiologist and they will document the results. It should become available on MyChart. Typically the results are divided into percentiles based on other patients of the same demographic and age. So it will compare your risk to others similar to you. If you have a higher score >99 or higher percentile >75%tile, it is recommended to consider Statin cholesterol therapy and or referral to Cardiologist. I will try to help explain your results and if we have questions we can contact the Cardiologist.  You will be contacted for scheduling. Usually it is done at any imaging facility through [facility], [facility] or [facility].  The cost is $99 flat fee total and it does not go through insurance, so no authorization is required.   DUE for FASTING BLOOD WORK (no food or drink after midnight before the lab appointment, only water or coffee without cream/sugar on the morning of)  SCHEDULE Lab Only visit in the morning at the clinic for lab draw in 3 MONTHS   - Make sure Lab Only appointment is at about 1 week before your next appointment, so that results will be available  For Lab Results, once available within 2-3 days of blood draw, you can can log in to MyChart online to view your results and a brief explanation. Also, we can discuss results at next follow-up visit.   Please schedule a Follow-up Appointment to: Return for 3 month fasting lab > 1 week later Annual Physical.  If you have any other questions or concerns, please feel free to call the office or send a message through MyChart. You may also schedule an earlier appointment if necessary.  Additionally, you may be receiving a survey about your experience at our office within a few days to 1 week by e-mail or mail. We value your feedback.  Marsa Officer, DO [facility], [place]

## 2024-11-08 ENCOUNTER — Other Ambulatory Visit: Payer: Self-pay | Admitting: Family Medicine

## 2024-11-08 DIAGNOSIS — E1169 Type 2 diabetes mellitus with other specified complication: Secondary | ICD-10-CM

## 2024-11-08 DIAGNOSIS — Z0000 Encounter for general adult medical examination without abnormal findings: Secondary | ICD-10-CM

## 2024-11-08 DIAGNOSIS — I10 Essential (primary) hypertension: Secondary | ICD-10-CM

## 2024-11-08 DIAGNOSIS — M48062 Spinal stenosis, lumbar region with neurogenic claudication: Secondary | ICD-10-CM

## 2024-11-09 ENCOUNTER — Ambulatory Visit
Admission: RE | Admit: 2024-11-09 | Discharge: 2024-11-09 | Disposition: A | Discharge status: Home / Self Care | Source: Ambulatory Visit | Attending: Neurosurgery | Admitting: Neurosurgery

## 2024-11-09 DIAGNOSIS — M4316 Spondylolisthesis, lumbar region: Secondary | ICD-10-CM | POA: Diagnosis present

## 2024-11-09 DIAGNOSIS — R200 Anesthesia of skin: Secondary | ICD-10-CM | POA: Diagnosis present

## 2024-11-09 DIAGNOSIS — Z981 Arthrodesis status: Secondary | ICD-10-CM | POA: Insufficient documentation

## 2024-11-11 ENCOUNTER — Ambulatory Visit
Admission: RE | Admit: 2024-11-11 | Discharge: 2024-11-11 | Disposition: A | Discharge status: Home / Self Care | Payer: Self-pay | Source: Ambulatory Visit | Attending: Family Medicine | Admitting: Family Medicine

## 2024-11-11 ENCOUNTER — Telehealth: Payer: Self-pay | Admitting: Neurosurgery

## 2024-11-11 DIAGNOSIS — E785 Hyperlipidemia, unspecified: Secondary | ICD-10-CM | POA: Insufficient documentation

## 2024-11-11 DIAGNOSIS — E1169 Type 2 diabetes mellitus with other specified complication: Secondary | ICD-10-CM | POA: Insufficient documentation

## 2024-11-11 NOTE — Telephone Encounter (Signed)
 MRI results have resulted for this pt, FYI.

## 2024-11-13 ENCOUNTER — Encounter: Payer: Self-pay | Admitting: Neurosurgery

## 2024-11-14 ENCOUNTER — Ambulatory Visit: Payer: Self-pay | Admitting: Family Medicine

## 2024-11-15 NOTE — Telephone Encounter (Signed)
 I will obtain PT notes from [facility].

## 2024-11-17 ENCOUNTER — Encounter: Payer: Self-pay | Admitting: Neurosurgery

## 2024-11-17 NOTE — Telephone Encounter (Signed)
 Pt called in and provided Chart Request fax #[phone] & they also stated that they will try and obtain these records in person if our fax does not assist in getting these records in time. Resent request via epic.

## 2024-11-17 NOTE — Telephone Encounter (Signed)
 Patient called in to follow up on the status of scheduling an appointment. She is scheduled for 12/20/2024 and I have requested the records from [facility]. Patient advised if records have not been received, we may have to move her appointment.

## 2024-11-22 ENCOUNTER — Other Ambulatory Visit: Payer: Self-pay

## 2024-11-22 DIAGNOSIS — F3342 Major depressive disorder, recurrent, in full remission: Secondary | ICD-10-CM

## 2024-11-22 MED ORDER — SERTRALINE HCL 100 MG PO TABS: 150.0000 mg | ORAL_TABLET | Freq: Every day | ORAL | 11 refills | Status: DC

## 2024-11-28 ENCOUNTER — Telehealth: Payer: Self-pay

## 2024-11-28 ENCOUNTER — Other Ambulatory Visit: Payer: Self-pay | Admitting: Family Medicine

## 2024-11-28 DIAGNOSIS — I10 Essential (primary) hypertension: Secondary | ICD-10-CM

## 2024-11-28 DIAGNOSIS — E1169 Type 2 diabetes mellitus with other specified complication: Secondary | ICD-10-CM

## 2024-11-28 DIAGNOSIS — K219 Gastro-esophageal reflux disease without esophagitis: Secondary | ICD-10-CM

## 2024-11-28 DIAGNOSIS — F3342 Major depressive disorder, recurrent, in full remission: Secondary | ICD-10-CM

## 2024-11-28 DIAGNOSIS — M5136 Other intervertebral disc degeneration, lumbar region with discogenic back pain only: Secondary | ICD-10-CM

## 2024-11-28 MED ORDER — MELOXICAM 15 MG PO TABS: 15.0000 mg | ORAL_TABLET | Freq: Every day | ORAL | 3 refills | Status: AC

## 2024-11-28 MED ORDER — METFORMIN HCL ER 500 MG PO TB24: 500.0000 mg | ORAL_TABLET | Freq: Two times a day (BID) | ORAL | 3 refills | Status: AC

## 2024-11-28 MED ORDER — ROSUVASTATIN CALCIUM 20 MG PO TABS: 20.0000 mg | ORAL_TABLET | Freq: Every day | ORAL | 3 refills | Status: AC

## 2024-11-28 MED ORDER — PANTOPRAZOLE SODIUM 40 MG PO TBEC: 40.0000 mg | DELAYED_RELEASE_TABLET | Freq: Every day | ORAL | 3 refills | Status: AC

## 2024-11-28 MED ORDER — SERTRALINE HCL 100 MG PO TABS: 150.0000 mg | ORAL_TABLET | Freq: Every day | ORAL | 11 refills | Status: AC

## 2024-11-28 MED ORDER — GABAPENTIN 300 MG PO CAPS: 300.0000 mg | ORAL_CAPSULE | Freq: Three times a day (TID) | ORAL | 3 refills | Status: AC

## 2024-11-28 MED ORDER — LISINOPRIL 5 MG PO TABS: 5.0000 mg | ORAL_TABLET | Freq: Every day | ORAL | 3 refills | Status: AC

## 2024-11-28 MED ORDER — AMLODIPINE BESYLATE 5 MG PO TABS: 5.0000 mg | ORAL_TABLET | Freq: Every day | ORAL | 3 refills | Status: AC

## 2024-11-28 NOTE — Telephone Encounter (Signed)
 Refills sent to [facility] as requested  Marsa Officer, DO [facility], [facility] 11/28/2024, 11:28 AM

## 2024-11-28 NOTE — Telephone Encounter (Signed)
 Copied from CRM #[id number]. Topic: Clinical - Prescription Issue >> Nov 28, 2024  8:56 AM Deleta RAMAN wrote: Reason for CRM: 8 prescription was faxed over on 1/20 from [facility]. Krysti from [facility] is calling to do a verbal order or follow up regarding these scripts. [phone] feel free to leave message as well. rosuvastatin  (CRESTOR ) 20 MG tablet, metFORMIN  (GLUCOPHAGE -XR) 500 MG 24 hr tablet, amLODipine  (NORVASC ) 5 MG tablet, gabapentin  (NEURONTIN ) 300 MG capsule, pantoprazole  (PROTONIX ) 40 MG tablet, sertraline  (ZOLOFT ) 100 MG tablet, lisinopril  (ZESTRIL ) 5 MG tablet, meloxicam  (MOBIC ) 15 MG tablet. 90 day supply for 3 refills. Fax [phone]

## 2024-11-28 NOTE — Telephone Encounter (Signed)
 Refills sent

## 2024-11-29 ENCOUNTER — Other Ambulatory Visit: Payer: Self-pay

## 2024-12-20 ENCOUNTER — Ambulatory Visit: Admitting: Neurosurgery

## 2025-02-07 ENCOUNTER — Other Ambulatory Visit

## 2025-02-14 ENCOUNTER — Encounter: Admitting: Family Medicine
